# Patient Record
Sex: Female | Born: 1955
Health system: Southern US, Community
[De-identification: ages and names within clinical notes are randomized; demographics above are authoritative.]

## PROBLEM LIST (undated history)

## (undated) DIAGNOSIS — J189 Pneumonia, unspecified organism: Secondary | ICD-10-CM

## (undated) DIAGNOSIS — E7800 Pure hypercholesterolemia, unspecified: Secondary | ICD-10-CM

## (undated) DIAGNOSIS — I8390 Asymptomatic varicose veins of unspecified lower extremity: Secondary | ICD-10-CM

## (undated) DIAGNOSIS — J449 Chronic obstructive pulmonary disease, unspecified: Secondary | ICD-10-CM

## (undated) DIAGNOSIS — F419 Anxiety disorder, unspecified: Secondary | ICD-10-CM

## (undated) DIAGNOSIS — R112 Nausea with vomiting, unspecified: Secondary | ICD-10-CM

## (undated) DIAGNOSIS — J302 Other seasonal allergic rhinitis: Secondary | ICD-10-CM

## (undated) DIAGNOSIS — E119 Type 2 diabetes mellitus without complications: Secondary | ICD-10-CM

## (undated) DIAGNOSIS — B029 Zoster without complications: Secondary | ICD-10-CM

## (undated) DIAGNOSIS — R87629 Unspecified abnormal cytological findings in specimens from vagina: Secondary | ICD-10-CM

## (undated) DIAGNOSIS — Z9889 Other specified postprocedural states: Secondary | ICD-10-CM

## (undated) DIAGNOSIS — J45909 Unspecified asthma, uncomplicated: Secondary | ICD-10-CM

## (undated) DIAGNOSIS — K5790 Diverticulosis of intestine, part unspecified, without perforation or abscess without bleeding: Secondary | ICD-10-CM

## (undated) HISTORY — PX: WRIST SURGERY: SHX841

## (undated) HISTORY — DX: Anxiety disorder, unspecified: F41.9

## (undated) HISTORY — PX: ABDOMINAL HYSTERECTOMY: SHX81

## (undated) HISTORY — DX: Unspecified abnormal cytological findings in specimens from vagina: R87.629

## (undated) HISTORY — PX: TONSILLECTOMY: SUR1361

## (undated) HISTORY — DX: Asymptomatic varicose veins of unspecified lower extremity: I83.90

## (undated) HISTORY — PX: BACK SURGERY: SHX140

## (undated) HISTORY — DX: Type 2 diabetes mellitus without complications: E11.9

## (undated) HISTORY — DX: Unspecified asthma, uncomplicated: J45.909

---
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
## 1997-12-19 ENCOUNTER — Ambulatory Visit (HOSPITAL_COMMUNITY): Admission: RE | Admit: 1997-12-19 | Discharge: 1997-12-19 | Payer: Self-pay | Admitting: Urology

## 1998-01-17 ENCOUNTER — Ambulatory Visit (HOSPITAL_COMMUNITY): Admission: RE | Admit: 1998-01-17 | Discharge: 1998-01-17 | Payer: Self-pay | Admitting: Urology

## 2000-09-25 ENCOUNTER — Ambulatory Visit (HOSPITAL_BASED_OUTPATIENT_CLINIC_OR_DEPARTMENT_OTHER): Admission: RE | Admit: 2000-09-25 | Discharge: 2000-09-25 | Payer: Self-pay | Admitting: Otolaryngology

## 2003-11-28 ENCOUNTER — Ambulatory Visit (HOSPITAL_COMMUNITY): Admission: RE | Admit: 2003-11-28 | Discharge: 2003-11-28 | Payer: Self-pay | Admitting: Internal Medicine

## 2003-12-30 ENCOUNTER — Inpatient Hospital Stay (HOSPITAL_COMMUNITY): Admission: EM | Admit: 2003-12-30 | Discharge: 2004-01-02 | Payer: Self-pay | Admitting: Emergency Medicine

## 2004-01-25 ENCOUNTER — Ambulatory Visit (HOSPITAL_COMMUNITY): Admission: RE | Admit: 2004-01-25 | Discharge: 2004-01-25 | Payer: Self-pay | Admitting: Internal Medicine

## 2004-02-09 ENCOUNTER — Ambulatory Visit (HOSPITAL_COMMUNITY): Admission: RE | Admit: 2004-02-09 | Discharge: 2004-02-10 | Payer: Self-pay | Admitting: Neurosurgery

## 2004-11-15 ENCOUNTER — Ambulatory Visit: Payer: Self-pay | Admitting: Internal Medicine

## 2005-01-03 ENCOUNTER — Ambulatory Visit (HOSPITAL_COMMUNITY): Admission: RE | Admit: 2005-01-03 | Discharge: 2005-01-03 | Payer: Self-pay | Admitting: General Surgery

## 2005-09-09 ENCOUNTER — Emergency Department (HOSPITAL_COMMUNITY): Admission: EM | Admit: 2005-09-09 | Discharge: 2005-09-09 | Payer: Self-pay | Admitting: Emergency Medicine

## 2006-10-23 ENCOUNTER — Ambulatory Visit (HOSPITAL_COMMUNITY): Admission: RE | Admit: 2006-10-23 | Discharge: 2006-10-23 | Payer: Self-pay | Admitting: Family Medicine

## 2007-04-16 ENCOUNTER — Ambulatory Visit (HOSPITAL_COMMUNITY): Admission: RE | Admit: 2007-04-16 | Discharge: 2007-04-16 | Payer: Self-pay | Admitting: Internal Medicine

## 2007-05-03 ENCOUNTER — Encounter: Admission: RE | Admit: 2007-05-03 | Discharge: 2007-05-03 | Payer: Self-pay | Admitting: Internal Medicine

## 2009-05-17 ENCOUNTER — Ambulatory Visit (HOSPITAL_COMMUNITY): Admission: RE | Admit: 2009-05-17 | Discharge: 2009-05-17 | Payer: Self-pay | Admitting: Internal Medicine

## 2010-01-13 ENCOUNTER — Emergency Department (HOSPITAL_COMMUNITY): Admission: EM | Admit: 2010-01-13 | Discharge: 2010-01-13 | Payer: Self-pay | Admitting: Emergency Medicine

## 2010-08-11 ENCOUNTER — Encounter: Payer: Self-pay | Admitting: Internal Medicine

## 2010-09-06 ENCOUNTER — Ambulatory Visit (HOSPITAL_COMMUNITY)
Admission: RE | Admit: 2010-09-06 | Discharge: 2010-09-06 | Disposition: A | Discharge status: Home / Self Care | Payer: Self-pay | Source: Ambulatory Visit | Attending: Family Medicine | Admitting: Family Medicine

## 2010-09-06 ENCOUNTER — Other Ambulatory Visit (HOSPITAL_COMMUNITY): Payer: Self-pay | Admitting: Family Medicine

## 2010-09-06 DIAGNOSIS — R059 Cough, unspecified: Secondary | ICD-10-CM | POA: Insufficient documentation

## 2010-09-06 DIAGNOSIS — R0689 Other abnormalities of breathing: Secondary | ICD-10-CM

## 2010-09-06 DIAGNOSIS — F17200 Nicotine dependence, unspecified, uncomplicated: Secondary | ICD-10-CM

## 2010-09-06 DIAGNOSIS — R05 Cough: Secondary | ICD-10-CM | POA: Insufficient documentation

## 2010-09-06 DIAGNOSIS — Z87891 Personal history of nicotine dependence: Secondary | ICD-10-CM | POA: Insufficient documentation

## 2010-09-06 DIAGNOSIS — R0602 Shortness of breath: Secondary | ICD-10-CM | POA: Insufficient documentation

## 2010-12-06 NOTE — Cardiovascular Report (Signed)
[name], [name]                      ACCOUNT NO.:  [id number]   MEDICAL RECORD NO.:  [id number]                   PATIENT TYPE:  INP   LOCATION:  3738                                 FACILITY:  [facility]   PHYSICIAN:  Thereasa Solo. Little, M.D.              DATE OF BIRTH:  12-23-1955   DATE OF PROCEDURE:  01/01/2004  DATE OF DISCHARGE:  01/02/2004                              CARDIAC CATHETERIZATION   [name] is a 55 year old female who had had a normal stress test  approximately 2 years ago.  She presented to Dr. Sharyon Medicus office on Saturday  morning after having had two days of continual chest discomfort associated  with radiation into her neck and down her arm.  Her EKG is normal and her  cardiac enzymes were unremarkable.  Despite heparin and IV nitroglycerin,  she has continued to have mild episodes and is brought to the cath lab for  evaluation of potential underlying CAD.   After obtaining informed consent, the patient was prepped and draped in the  usual sterile fashion.  Following local anesthetic with 1% Xylocaine, the  Seldinger technique was employed and a 5 [place] introducer sheath was placed  into the right femoral artery.  Left and right coronary arteriography and  ventriculography in the RAO projection and distal aortogram was performed.   COMPLICATIONS:  None.   EQUIPMENT:  5 French Judkins configuration catheters.   RESULTS:  1. Hemodynamic monitoring.  Central aortic pressure 142/78, left ventricular     pressure 146/16.  There was no significant valve gradient noted at time     of pullback.  2. Ventriculography.  Ventriculography in the RAO projection using 25 mL of     contrast at 12 mL/sec revealed normal left ventricular systolic function.     Ventricular ectopy occurred during the ventriculogram and there was     mitral regurgitation noted, but I think this was secondary to ventricular     ectopy.  Ejection fraction was clearly greater than 60% and  the end-     diastolic pressure was 18.   DISTAL AORTOGRAM:  Distal aortogram done right above the level of the ramus  using 30 mL of contrast at 15 mL/sec showed no evidence of renal artery  stenosis and no abdominal aortic aneurysm.  The iliacs appear to be smooth  in contour.   CORONARY ARTERIOGRAPHY:  On fluoroscopy no calcification was seen.  1. Left main normal.  2. LAD.  The LAD went to the apex of the heart, gave rise to a large first     diagonal branch, this system was free of disease.  3. Circumflex.  The circumflex gave rise to two OM vessels.  The first one     was relatively small, the second was moderate size, __________ free of     disease.  4. Right coronary artery.  The right coronary artery was normal.  There was  a PDA and a small  posterolateral vessel that were also free of disease.     After three injections into the right coronary artery, there appeared to     be coronary artery spasm around the catheter.  Intracoronary nitro was     given, it completely resolved.   CONCLUSION:  1. Normal left ventricular systolic function.  2. No coronary artery disease.  3. No renal artery stenosis or abdominal aortic aneurysm.   At this point I cannot explain her symptoms based on her cardiac cath.  Will  keep her on PPI and give her antianxiety drugs.  Probably ready for  discharge in the a.m.                                               Thereasa Solo. Little, M.D.    ABL/MEDQ  D:  01/01/2004  T:  01/02/2004  Job:  [id number]   cc:   Madelin Rear. Sherwood Gambler, M.D.  [address]  [place]  [place] [address]  Fax: [phone]   Cath Lab

## 2010-12-06 NOTE — Discharge Summary (Signed)
NAMEANGLA, DELAHUNT                      ACCOUNT NO.:  [id number]   MEDICAL RECORD NO.:  [id number]                   PATIENT TYPE:  INP   LOCATION:  3738                                 FACILITY:  [facility]   PHYSICIAN:  Thereasa Solo. Little, M.D.              DATE OF BIRTH:  11/15/1955   DATE OF ADMISSION:  12/30/2003  DATE OF DISCHARGE:  01/02/2004                                 DISCHARGE SUMMARY   Ms. [name] is a 55 year old white female patient who was admitted  with chest pain.  She was seen by Dr. Julieanne Manson.  It was decided she  should undergo cardiac catheterization.  She was put on IV heparin, ruled  out for an MI, then on January 01, 2004 she underwent cardiac catheterization  by Dr. Julieanne Manson which showed normal coronary arteries.  She had normal  systolic function.  Dr. Clarene Duke saw her the following day, January 02, 2004.  Her blood pressure was 99/72, heart rate 77, temperature 98.5.  He felt that  she was stable for discharge to home.  She should be treated with Xanax and  a PPI.  Her chest pain was not related to any cardiac disease, possibly  related to increased stress or GI problems.  Thus, he felt that she should  follow up with Dr. Sherwood Gambler, her primary care physician.   LABORATORY DATA:  CK-MB's and troponin's were negative.  January 01, 2004,  sodium was 140, potassium 3.5, BUN 9, creatinine 0.8, glucose was 102.  Hemoglobin 11.9, hematocrit 34.4, platelets 226, WBC 9.7.  Total cholesterol  was 182, triglycerides 116, HDL was 46, and LDL was 113.  LFT's were within  normal limits.  The morning of her discharge, her potassium was 3.9, BUN 26,  creatinine 1.   She had a CT of her chest which showed no significant findings.  She had no  pulmonary embolus.  Upper abdomen also demonstrated no abnormalities.   DISCHARGE MEDICATIONS:  1. Xanax 0.25 mg.  May take 1 every eight hours as needed.  2. Nexium 40 mg once per day.  3. Xenical as taken previously.   She  should do no strenuous activity, lifting, pushing, pulling until Monday  and no driving x2 days.  If she has any problems with her groin, she will  please give our office a call.  She will follow up with Dr. Sherwood Gambler in one  week.   DISCHARGE DIAGNOSES:  1. Chest pain, not coronary ischemic related with negative CK-MB's and     negative coronary artery disease on     cardiac catheterization.  She also has a normal left ventricular     function.  2. Elevated stress.  3. Questionable gastroesophageal reflux disease.      [name], N.P.                        Thereasa Solo. Little,  M.D.    BB/MEDQ  D:  01/02/2004  T:  01/02/2004  Job:  [id number]   cc:   Madelin Rear. Sherwood Gambler, M.D.  [address]  [place]  [place] [address]  Fax: [phone]

## 2010-12-06 NOTE — Op Note (Signed)
NAMESABA, GOMM                      ACCOUNT NO.:  [id number]   MEDICAL RECORD NO.:  [id number]                   PATIENT TYPE:  OIB   LOCATION:  3013                                 FACILITY:  [facility]   PHYSICIAN:  Hilda Lias, M.D.                DATE OF BIRTH:  February 17, 1956   DATE OF PROCEDURE:  02/09/2004  DATE OF DISCHARGE:                                 OPERATIVE REPORT   PREOPERATIVE DIAGNOSIS:  C5-C6 herniated disk with a C6 radiculopathy.   POSTOPERATIVE DIAGNOSIS:  C5-C6 herniated disk with a C6 radiculopathy.   OPERATION PERFORMED:  Anterior 5-6 diskectomy, decompression of the spinal  cord, foraminotomy, interbody fusion.  Allograft, plate, microscope.   SURGEON:  Hilda Lias, M.D.   ASSISTANT:  Hewitt Shorts, M.D.   ANESTHESIA:  General.   INDICATIONS FOR PROCEDURE:  The patient was admitted because of neck and  left upper extremity pain.  The patient had work-up which was negative for  myocardial infarction.  MRI showed herniated disk at the level 5-6.  Surgery  was advised.  The risks were explained in the history and physical.   DESCRIPTION OF PROCEDURE:  The patient was taken to the operating room and  after intubation, the neck was prepped with Betadine.  A transverse incision  was made through the skin, platysma, subcutaneous tissue down to the  cervical spine.  X-ray showed that indeed we were at the level of 5-6.  We  brought the microscope into the area and the anterior ligament was opened.  Total diskectomy was accomplished.  We opened the posterior ligament and  decompression of the spinal cord followed by foraminotomy was done.  At the  level of 5-6 on the left, there was a herniated disk with a soft and a hard  component.  Decompression was done.  Having done this, the end plate was  drilled and allograft of 7 mm was inserted followed by a plate using four  screws.  Lateral C-spine showed good position of bone graft.  From then on,  the area was irrigated.  Hemostasis was done with bipolar.  The wound was  closed with Vicryl and Steri-Strips.                                               Hilda Lias, M.D.    EB/MEDQ  D:  02/09/2004  T:  02/10/2004  Job:  [id number]

## 2010-12-06 NOTE — Op Note (Signed)
[facility]  Patient:    [name], [name]                   MRN: [id number] Proc. Date: 09/25/00 Adm. Date:  [id number] Attending:  Serena Colonel H CC:         Artis Delay, M.D., [place]   Operative Report  PREOPERATIVE DIAGNOSIS:  Nasal septal deviation.  Inferior turbinate hypertrophy.  POSTOPERATIVE DIAGNOSIS:  Nasal septal deviation.  Inferior turbinate hypertrophy.  PROCEDURE:  Nasal septoplasty.  Submucous resection inferior turbinates bilaterally.  SURGEON:  Jefry H. Pollyann Kennedy, M.D.  ANESTHESIA:  General endotracheal anesthesia was used.  COMPLICATIONS:  None.  ESTIMATED BLOOD LOSS:  30 cc.  FINDINGS:  Severely deviated septum with a large spur extending almost down the entire length of the left nasal cavity causing significant obstruction on the left side.  The spur was composed of the junction of the vulmerian bone and the ethmoid plate posteriorly and the maxillary crest and the ethmoid plate anteriorly.  There was a high septal deviation cartilaginous septum toward the right side as well anteriorly.  The turbinates were enlarged bilaterally with severe bony thickening on the right side in particular.  The patient tolerated the procedure well, was awakened, extubated, and transferred to the recovery room in stable condition.  INDICATIONS:  This is a 55 year old lady with a history of chronic nasal obstruction and a history of recurring sinusitis.  Risks, benefits, alternatives, and complications of the procedure were explained to the patient who seemed to understand and agreed to surgery.  DESCRIPTION OF PROCEDURE:  The patient was taken to the operating room and placed on the operating table in the supine position.  Following induction of general endotracheal anesthesia, the table was positioned, the patient was prepped and draped in the usual sterile fashion.  Oxymetazoline spray was used in the nose preoperatively.  1%  Xylocaine with epinephrine was infiltrated into the septum, the columella, and the inferior turbinates bilaterally. Total of about 4 cc was used.  Afrin-soaked pledgets were placed bilaterally in the nasal cavities.  #1 - Nasal septoplasty.  A left hemitransfixion incision was used to approach the septal cartilage and a mucoperiochondrial flap was developed posteriorly down the left side back to the sphenoid rostrum.  The bony cartilaginous junction was divided.  A similar flap was developed posteriorly down the right side.  Mucosa was elevated off the maxillary crest bilaterally.  The maxillary crest was severely widened.  The superior attachment of the ethmoid plate was resected using Jansen-Middleton rongeurs and the posterior inferior attachments were taken down using Therapist, nutritional to outfracture.  Large fragments of ethmoid plate were resected.  The vulmerian and the maxillary crest deflection were taken down using combination of Therapist, nutritional and [employer]  Part of the quadrangular cartilage up high and posteriorly was resected to relieve the anterior deviation.  All of these maneuvers greatly enhanced the nasal airways on both sides, in particular on the left side.  The mucosa incision was reapproximated with 4-0 chromic suture interrupted and a quilting suture of 4-0 plain gut was used to reoppose the septal flaps.  #2 - Submucous resection of inferior turbinates.  The leading edge of the inferior turbinates was incised in a vertical fashion using a 15 scalpel on both sides.  Mucosa was elevated off the turbinate bones in all directions and large fragments of bone were resected on the right side.  The left side was outfractured using a Glorious Peach  elevator.  A turbinate remnant on the right was outfractured as well.  The nasal cavities were suctioned of blood and secretions and packed with rolled up Telfa gauze coated with bacitracin ointment. The pharynx was  suctioned of blood and secretions under direct visualization.  The patient was then awakened, extubated, and transferred to the recovery room. DD:  09/25/00 TD:  09/26/00 Job: [id number]

## 2010-12-06 NOTE — H&P (Signed)
[name], [name]            ACCOUNT NO.:  [id number]   MEDICAL RECORD NO.:  [id number]           PATIENT TYPE:  AMB   LOCATION:                                FACILITY:  [facility]   PHYSICIAN:  Dalia Heading, M.D.  DATE OF BIRTH:  28-Feb-1956   DATE OF ADMISSION:  DATE OF DISCHARGE:  LH                                HISTORY & PHYSICAL   DATE OF BIRTH:  1955/12/25.   CHIEF COMPLAINT:  Hematochezia.   HISTORY OF PRESENT ILLNESS:  The patient is a 55 year old white female who  was referred for endoscopic evaluation.  She needs a colonoscopy for  hematochezia.  She has been having some hemoccult-positive stools recently.  No abdominal pain, weight loss, nausea, vomiting, diarrhea, constipation or  melena had been noted.  She has never had a colonoscopy.  There is no family  history of colon carcinoma.   PAST MEDICAL HISTORY:  Includes depression.   PAST SURGICAL HISTORY:  Partial hysterectomy, right foot surgery, neck  surgery.   CURRENT MEDICATIONS:  Nexium, alprazolam, Lexapro.   ALLERGIES:  CODEINE.   REVIEW OF SYSTEMS:  The patient smokes cigarettes daily.  She denies any  significant alcohol use.  She denies any other cardiopulmonary difficulties  or bleeding disorders.   PHYSICAL EXAMINATION:  GENERAL APPEARANCE:  The patient is a well-developed,  well-nourished white female in no acute distress.  VITAL SIGNS:  She is afebrile, and the vital signs are stable.  LUNGS:  Clear to auscultation with equal breath sounds bilaterally.  HEART:  Reveals regular rate and rhythm without S3, S4 or murmurs.  ABDOMEN:  Soft, nontender, nondistended.  No hepatosplenomegaly or masses  are noted.  RECTAL:  Examination was deferred to the procedure.   IMPRESSION:  Hematochezia.   PLAN:  The patient is scheduled for a colonoscopy on January 03, 2005.  The  risks and benefits of the procedure including bleeding and perforation were  fully explained to the patient, who gave informed  consent.       MAJ/MEDQ  D:  01/02/2005  T:  01/02/2005  Job:  [id number]   cc:   Dalia Heading, M.D.  [address], [place]  [place] [address]  Fax: [phone]   Madelin Rear. Sherwood Gambler, MD  [address]  [place]  [place] [address]  Fax: [phone]

## 2010-12-06 NOTE — H&P (Signed)
[name], [name]                      ACCOUNT NO.:  [id number]   MEDICAL RECORD NO.:  [id number]                   PATIENT TYPE:  OIB   LOCATION:  3013                                 FACILITY:  [facility]   PHYSICIAN:  Hilda Lias, M.D.                DATE OF BIRTH:  Oct 19, 1955   DATE OF ADMISSION:  02/09/2004  DATE OF DISCHARGE:                                HISTORY & PHYSICAL   REASON FOR ADMISSION:  [name] is a lady who was seen by me in my  office several days ago because of neck pain radiation into her shoulder and  then to the left upper extremity associated with weakness, numbness.  The  patient is quite miserable.  The patient was admitted to [facility]  a few days ago because they thought she had an MI.  Cardiovascular workup  was negative.  MRI of the neck was obtained and she is sent to [place] for  evaluation.   PAST MEDICAL HISTORY:  1. Bladder surgery.  2. Hysterectomy.  3. Foot surgery.   She is ALLERGIC to CODEINE.   SOCIAL HISTORY:  Negative.   FAMILY HISTORY:  Mother is 10 and she had a bypass surgery.  Father is 68  with COPD.   REVIEW OF SYSTEMS:  Positive for arm weakness, chest pain.   PHYSICAL EXAMINATION:  GENERAL:  A patient who came to my office and she was  holding the left arm against the chest wall to prevent the pain.  HEAD, EARS, NOSE AND THROAT:  Normal.  NECK:  She is able to flex but extension causes pain that goes to both  shoulders.  LUNGS:  Clear.  CARDIAC:  Heart sounds normal.  ABDOMEN:  Normal.  EXTREMITIES:  Normal pulses.  NEUROLOGICAL:  Mental status normal.  Cranial nerves normal.  Strength:  She  has a weakness to the left bicep and less resistance.  Reflex is symmetrical  with absent on the left biceps.   Cervical spine cranial spine MRI shows she has a herniated disk with  spondylolisthesis at the level of C5-6, centered to the left.   CLINICAL IMPRESSION:  C5-6 herniated disk.   RECOMMENDATIONS:  The  patient is being admitted for anterior cervical  diskectomy and fusion.  She knows all the risks such as infection, CSF leak,  worsening pain, paralysis, damage to the vertebral artery, carotid, damage  to esophagus, failure of the graft, need for further surgery and no  improvement whatsoever.                                                Hilda Lias, M.D.    EB/MEDQ  D:  02/09/2004  T:  02/09/2004  Job:  [id number]

## 2013-04-20 ENCOUNTER — Emergency Department (HOSPITAL_COMMUNITY)
Admission: EM | Admit: 2013-04-20 | Discharge: 2013-04-20 | Discharge status: Against Medical Advice | Payer: Self-pay | Attending: Emergency Medicine | Admitting: Emergency Medicine

## 2013-04-20 ENCOUNTER — Encounter (HOSPITAL_COMMUNITY): Payer: Self-pay

## 2013-04-20 DIAGNOSIS — R21 Rash and other nonspecific skin eruption: Secondary | ICD-10-CM | POA: Insufficient documentation

## 2013-04-20 NOTE — ED Notes (Signed)
Pt reports rash to nose, face and back. Large pimple to tip of nose.  Pt stated area is very painful

## 2013-04-20 NOTE — ED Notes (Signed)
Pt stated to jason reg. [name] that she was leaving. Had to take mother home.

## 2013-04-21 ENCOUNTER — Emergency Department (HOSPITAL_COMMUNITY)
Admission: EM | Admit: 2013-04-21 | Discharge: 2013-04-21 | Disposition: A | Discharge status: Home / Self Care | Payer: Self-pay | Attending: Emergency Medicine | Admitting: Emergency Medicine

## 2013-04-21 ENCOUNTER — Encounter (HOSPITAL_COMMUNITY): Payer: Self-pay | Admitting: *Deleted

## 2013-04-21 DIAGNOSIS — Z880 Allergy status to penicillin: Secondary | ICD-10-CM | POA: Insufficient documentation

## 2013-04-21 DIAGNOSIS — X58XXXA Exposure to other specified factors, initial encounter: Secondary | ICD-10-CM | POA: Insufficient documentation

## 2013-04-21 DIAGNOSIS — Y939 Activity, unspecified: Secondary | ICD-10-CM | POA: Insufficient documentation

## 2013-04-21 DIAGNOSIS — S0501XA Injury of conjunctiva and corneal abrasion without foreign body, right eye, initial encounter: Secondary | ICD-10-CM

## 2013-04-21 DIAGNOSIS — S058X9A Other injuries of unspecified eye and orbit, initial encounter: Secondary | ICD-10-CM | POA: Insufficient documentation

## 2013-04-21 DIAGNOSIS — B029 Zoster without complications: Secondary | ICD-10-CM | POA: Insufficient documentation

## 2013-04-21 DIAGNOSIS — F17200 Nicotine dependence, unspecified, uncomplicated: Secondary | ICD-10-CM | POA: Insufficient documentation

## 2013-04-21 DIAGNOSIS — Y929 Unspecified place or not applicable: Secondary | ICD-10-CM | POA: Insufficient documentation

## 2013-04-21 HISTORY — DX: Zoster without complications: B02.9

## 2013-04-21 MED ORDER — ACYCLOVIR 800 MG PO TABS
800.0000 mg | ORAL_TABLET | Freq: Once | ORAL | Status: AC
  Administered 2013-04-21: 800 mg via ORAL
  Filled 2013-04-21: qty 1

## 2013-04-21 MED ORDER — OXYCODONE-ACETAMINOPHEN 5-325 MG PO TABS: 1.0000 | ORAL_TABLET | ORAL | Status: DC | PRN

## 2013-04-21 MED ORDER — FLUORESCEIN SODIUM 1 MG OP STRP
ORAL_STRIP | OPHTHALMIC | Status: AC
  Administered 2013-04-21: 1 via OPHTHALMIC
  Filled 2013-04-21: qty 2

## 2013-04-21 MED ORDER — OXYCODONE-ACETAMINOPHEN 5-325 MG PO TABS
2.0000 | ORAL_TABLET | Freq: Once | ORAL | Status: AC
  Administered 2013-04-21: 2 via ORAL
  Filled 2013-04-21: qty 2

## 2013-04-21 MED ORDER — NAPROXEN 500 MG PO TABS: 500.0000 mg | ORAL_TABLET | Freq: Two times a day (BID) | ORAL | Status: DC

## 2013-04-21 MED ORDER — ACYCLOVIR 400 MG PO TABS: 800.0000 mg | ORAL_TABLET | Freq: Every day | ORAL | Status: DC

## 2013-04-21 MED ORDER — FLUORESCEIN SODIUM 1 MG OP STRP
1.0000 | ORAL_STRIP | Freq: Once | OPHTHALMIC | Status: AC
  Administered 2013-04-21: 1 via OPHTHALMIC

## 2013-04-21 MED ORDER — TETRACAINE HCL 0.5 % OP SOLN
OPHTHALMIC | Status: AC
  Administered 2013-04-21: 2 [drp] via OPHTHALMIC
  Filled 2013-04-21: qty 2

## 2013-04-21 MED ORDER — TETRAHYDROZOLINE HCL 0.05 % OP SOLN: 1.0000 [drp] | Freq: Four times a day (QID) | OPHTHALMIC | Status: DC

## 2013-04-21 MED ORDER — TETRACAINE HCL 0.5 % OP SOLN
2.0000 [drp] | Freq: Once | OPHTHALMIC | Status: AC
  Administered 2013-04-21: 2 [drp] via OPHTHALMIC

## 2013-04-21 NOTE — Progress Notes (Signed)
ED/CM noted patient did not have health insurance and/or PCP listed in the computer.  Patient was given the [place] resource handout with information on the clinics, food pantries, and the handout for new health insurance sign-up.  Patient expressed appreciation for this. 

## 2013-04-21 NOTE — ED Notes (Signed)
Pt states this is her 9th episode of shingles. Noticed rash and pain to upper back/shoulder, hairline and sores beginning in mouth and nose.

## 2013-04-21 NOTE — ED Notes (Signed)
Pt alert & oriented x4, stable gait. Patient given discharge instructions, paperwork & prescription(s). Patient  instructed to stop at the registration desk to finish any additional paperwork. Patient verbalized understanding. Pt left department w/ no further questions. 

## 2013-04-21 NOTE — ED Provider Notes (Signed)
CSN: [id number]     Arrival date & time 04/21/13  1007 History  This chart was scribed for Vida Roller, MD, by Yevette Edwards, ED Scribe. This patient was seen in room APA07/APA07 and the patient's care was started at 10:58 AM.  First MD Initiated Contact with Patient 04/21/13 1037     Chief Complaint  Patient presents with  . Herpes Zoster    The history is provided by the patient.   HPI Comments: [name] is a 57 y.o. female, with a h/o shingles, who presents to the Emergency Department complaining of gradual-onset, persistent shingles which began three days ago and is not associated with a fever. She states she has affected areas to her mouth, nose, brow, hairline, upper back, and shoulder. She is experiencing pain associated with the areas. The pt denies any pain to her eyes, but she reports she experiences pressure. She reports that she has been under increased stress recently due to family responsibilities. She denies HIV or splenectomy. She has not had any vaccination since 2008.   Past Medical History  Diagnosis Date  . Shingles    Past Surgical History  Procedure Laterality Date  . Back surgery    . Tonsillectomy     No family history on file. History  Substance Use Topics  . Smoking status: Current Every Day Smoker  . Smokeless tobacco: Not on file  . Alcohol Use: No   No OB history provided.  Review of Systems  Constitutional: Negative for fever and chills.  Eyes: Negative for pain.  Skin: Positive for rash.    Allergies  Penicillins  Home Medications  No current outpatient prescriptions on file.  Triage Vitals: BP 136/87  Pulse 70  Temp(Src) 97.8 F (36.6 C) (Oral)  Resp 16  SpO2 98%  Physical Exam  Nursing note and vitals reviewed. Constitutional: She is oriented to person, place, and time. She appears well-developed and well-nourished. No distress.  HENT:  Head: Normocephalic and atraumatic.  Eyes: EOM are normal.  Bilateral corneal  abrasion in a inferior corneal location - not linear or dendritic.  Neck: Neck supple. No tracheal deviation present.  Cardiovascular: Normal rate, regular rhythm and normal heart sounds.   No murmur heard. Pulmonary/Chest: Effort normal and breath sounds normal. No respiratory distress. She has no wheezes.  Musculoskeletal: Normal range of motion.  Neurological: She is alert and oriented to person, place, and time.  Skin: Skin is warm and dry.  Erythematous scabbed lesions located on the nose, several on the chin, cheeks, and on upper back at midline.  There were no intra-oral lesions.  Tender to surface of nose.   Psychiatric: She has a normal mood and affect. Her behavior is normal.    ED Course  Procedures (including critical care time)  DIAGNOSTIC STUDIES: Oxygen Saturation is 98% on room air, normal by my interpretation.    COORDINATION OF CARE:  11:07 AM -Discussed treatment plan with patient, and the patient agreed to the plan.   11:11 AM- Fluorescein performed. Informed pt of results.   11:28 PM - 11:32 PM- Consulted with Dr. Charise Killian, an optometrist. His recommendation is oral acyclovir, and he will see her tomorrow - he believes that due to her history of dry eyes and using benadryl at night with bilateral corneal findings it is unlikely to be zoster.  The lesions are not the classic dendritic appearance.  Pt is otherwise stable for d/c.  Labs Review Labs Reviewed - No data to  display Imaging Review No results found.  MDM  No diagnosis found. Well appearing, medicine started in the emergency department, followup arranged with optometrist for tomorrow. This is the patient's optometrist and he feels comfortable handling this complaint in the office.  Meds given in ED:  Medications  tetracaine (PONTOCAINE) 0.5 % ophthalmic solution 2 drop (2 drops Both Eyes Given 04/21/13 1159)  fluorescein ophthalmic strip 1 strip (1 strip Both Eyes Given 04/21/13 1159)  acyclovir  (ZOVIRAX) tablet 800 mg (800 mg Oral Given 04/21/13 1158)  oxyCODONE-acetaminophen (PERCOCET/ROXICET) 5-325 MG per tablet 2 tablet (2 tablets Oral Given 04/21/13 1158)    Discharge Medication List as of 04/21/2013 11:46 AM    START taking these medications   Details  acyclovir (ZOVIRAX) 400 MG tablet Take 2 tablets (800 mg total) by mouth 5 (five) times daily., Starting 04/21/2013, Until Discontinued, Print    naproxen (NAPROSYN) 500 MG tablet Take 1 tablet (500 mg total) by mouth 2 (two) times daily with a meal., Starting 04/21/2013, Until Discontinued, Print    oxyCODONE-acetaminophen (PERCOCET) 5-325 MG per tablet Take 1 tablet by mouth every 4 (four) hours as needed for pain., Starting 04/21/2013, Until Discontinued, Print    tetrahydrozoline (VISINE) 0.05 % ophthalmic solution Place 1 drop into both eyes 4 (four) times daily., Starting 04/21/2013, Until Discontinued, Print          I personally performed the services described in this documentation, which was scribed in my presence. The recorded information has been reviewed and is accurate.      Vida Roller, MD 04/22/13 [phone]

## 2013-06-07 ENCOUNTER — Other Ambulatory Visit (HOSPITAL_COMMUNITY): Payer: Self-pay | Admitting: Physician Assistant

## 2013-06-07 DIAGNOSIS — Z139 Encounter for screening, unspecified: Secondary | ICD-10-CM

## 2013-06-23 ENCOUNTER — Ambulatory Visit (HOSPITAL_COMMUNITY)
Admission: RE | Admit: 2013-06-23 | Discharge: 2013-06-23 | Disposition: A | Discharge status: Home / Self Care | Payer: Self-pay | Source: Ambulatory Visit | Attending: Physician Assistant | Admitting: Physician Assistant

## 2013-06-23 DIAGNOSIS — Z139 Encounter for screening, unspecified: Secondary | ICD-10-CM

## 2013-10-12 ENCOUNTER — Other Ambulatory Visit (HOSPITAL_COMMUNITY): Payer: Self-pay | Admitting: Internal Medicine

## 2013-10-12 DIAGNOSIS — Z0000 Encounter for general adult medical examination without abnormal findings: Secondary | ICD-10-CM

## 2015-04-13 ENCOUNTER — Other Ambulatory Visit (HOSPITAL_COMMUNITY): Payer: Self-pay | Admitting: Physician Assistant

## 2015-04-13 DIAGNOSIS — Z1231 Encounter for screening mammogram for malignant neoplasm of breast: Secondary | ICD-10-CM

## 2015-04-18 ENCOUNTER — Ambulatory Visit (HOSPITAL_COMMUNITY)
Admission: RE | Admit: 2015-04-18 | Discharge: 2015-04-18 | Disposition: A | Discharge status: Home / Self Care | Payer: 59 | Source: Ambulatory Visit | Attending: Physician Assistant | Admitting: Physician Assistant

## 2015-04-18 DIAGNOSIS — Z1231 Encounter for screening mammogram for malignant neoplasm of breast: Secondary | ICD-10-CM | POA: Diagnosis not present

## 2015-04-20 ENCOUNTER — Other Ambulatory Visit: Payer: Self-pay | Admitting: Physician Assistant

## 2015-04-20 DIAGNOSIS — R928 Other abnormal and inconclusive findings on diagnostic imaging of breast: Secondary | ICD-10-CM

## 2015-05-08 ENCOUNTER — Ambulatory Visit (HOSPITAL_COMMUNITY)
Admission: RE | Admit: 2015-05-08 | Discharge: 2015-05-08 | Disposition: A | Discharge status: Home / Self Care | Payer: 59 | Source: Ambulatory Visit | Attending: Physician Assistant | Admitting: Physician Assistant

## 2015-05-08 DIAGNOSIS — R928 Other abnormal and inconclusive findings on diagnostic imaging of breast: Secondary | ICD-10-CM

## 2015-05-08 DIAGNOSIS — N6001 Solitary cyst of right breast: Secondary | ICD-10-CM | POA: Diagnosis present

## 2015-07-13 ENCOUNTER — Other Ambulatory Visit: Payer: Self-pay

## 2015-07-13 DIAGNOSIS — I8390 Asymptomatic varicose veins of unspecified lower extremity: Secondary | ICD-10-CM

## 2015-09-07 ENCOUNTER — Encounter: Payer: Self-pay | Admitting: Vascular Surgery

## 2015-09-18 ENCOUNTER — Ambulatory Visit (INDEPENDENT_AMBULATORY_CARE_PROVIDER_SITE_OTHER): Payer: BLUE CROSS/BLUE SHIELD | Admitting: Vascular Surgery

## 2015-09-18 ENCOUNTER — Ambulatory Visit (HOSPITAL_COMMUNITY)
Admission: RE | Admit: 2015-09-18 | Discharge: 2015-09-18 | Disposition: A | Discharge status: Home / Self Care | Payer: BLUE CROSS/BLUE SHIELD | Source: Ambulatory Visit | Attending: Vascular Surgery | Admitting: Vascular Surgery

## 2015-09-18 ENCOUNTER — Encounter: Payer: Self-pay | Admitting: Vascular Surgery

## 2015-09-18 VITALS — BP 107/74 | HR 62 | Temp 98.0°F | Resp 16 | Ht 67.0 in | Wt 175.0 lb

## 2015-09-18 DIAGNOSIS — I868 Varicose veins of other specified sites: Secondary | ICD-10-CM

## 2015-09-18 DIAGNOSIS — I8390 Asymptomatic varicose veins of unspecified lower extremity: Secondary | ICD-10-CM

## 2015-09-18 DIAGNOSIS — R609 Edema, unspecified: Secondary | ICD-10-CM | POA: Diagnosis present

## 2015-09-18 DIAGNOSIS — I8393 Asymptomatic varicose veins of bilateral lower extremities: Secondary | ICD-10-CM | POA: Diagnosis not present

## 2015-09-18 DIAGNOSIS — I872 Venous insufficiency (chronic) (peripheral): Secondary | ICD-10-CM

## 2015-09-18 NOTE — Progress Notes (Signed)
[facility]  Patient name: [name] MRN: [id number] DOB: 1955/11/26 Sex: female  REASON FOR CONSULT: Varicose veins  HPI: [name] is a 60 y.o. female, who presents for evaluation of varicose veins and leg pain,  Left greater than right. The patient reports onset of her symptoms about a year ago, with worsening over the past 6 months. The patient reports aching, heaviness and throbbing over the large varicosity of her left leg. She also reports increased swelling of her legs as the day goes on. The patient's symptoms are worse towards the end of the day. 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 She states that her leg pain is interfering with completing her daily work activities. The patient has seen her PCP who prescribed thigh-high compression stockings. She has been wearing these for the past 6 months without improvement. The patient also reports recently that her left leg varicosity behind her knee "popped" and she bled onto the couch. She reports this as very painful and caused her to jump up from the couch.   She has a family history of varicose veins. She has never had a DVT. The patient denies a history of claudication, rest pain and nonhealing wounds. He shouldn't is a half pack per day smoker for the past 40 years. She has no cardiac history.  Past Medical History  Diagnosis Date  . Shingles     History reviewed. No pertinent family history.  SOCIAL HISTORY: Social History   Social History  . Marital Status: Widowed    Spouse Name: N/A  . Number of Children: N/A  . Years of Education: N/A   Occupational History  . Not on file.   Social History Main Topics  . Smoking status: Current Every Day Smoker  . Smokeless tobacco: Not on file  . Alcohol Use: No  . Drug Use: No  . Sexual Activity: No   Other Topics Concern  . Not on file   Social History Narrative    Allergies  Allergen Reactions  . Penicillins      Current Outpatient Prescriptions  Medication Sig Dispense Refill  . aspirin 81 MG tablet Take 81 mg by mouth daily.    Marland Kitchen acyclovir (ZOVIRAX) 400 MG tablet Take 2 tablets (800 mg total) by mouth 5 (five) times daily. (Patient not taking: Reported on 09/18/2015) 50 tablet 0  . naproxen (NAPROSYN) 500 MG tablet Take 1 tablet (500 mg total) by mouth 2 (two) times daily with a meal. (Patient not taking: Reported on 09/18/2015) 30 tablet 0  . oxyCODONE-acetaminophen (PERCOCET) 5-325 MG per tablet Take 1 tablet by mouth every 4 (four) hours as needed for pain. (Patient not taking: Reported on 09/18/2015) 20 tablet 0  . tetrahydrozoline (VISINE) 0.05 % ophthalmic solution Place 1 drop into both eyes 4 (four) times daily. (Patient not taking: Reported on 09/18/2015) 15 mL 0   No current facility-administered medications for this visit.    REVIEW OF SYSTEMS:  [X]  denotes positive finding, [ ]  denotes negative finding Cardiac  Comments:  Chest pain or chest pressure:    Shortness of breath upon exertion:    Short of breath when lying flat:    Irregular heart rhythm:        Vascular    Pain in calf, thigh, or hip brought on by ambulation:    Pain in feet at night that wakes you up from your sleep:  x Pain in legs  Blood  clot in your veins:    Leg swelling:  x       Pulmonary    Oxygen at home:    Productive cough:     Wheezing:         Neurologic    Sudden weakness in arms or legs:     Sudden numbness in arms or legs:     Sudden onset of difficulty speaking or slurred speech:    Temporary loss of vision in one eye:     Problems with dizziness:         Gastrointestinal    Blood in stool:     Vomited blood:         Genitourinary    Burning when urinating:     Blood in urine:        Psychiatric    Major depression:         Hematologic    Bleeding problems:    Problems with blood clotting too easily:        Skin    Rashes or ulcers:        Constitutional    Fever or chills:       PHYSICAL EXAM: Filed Vitals:   09/18/15 1245  BP: 107/74  Pulse: 62  Temp: 98 F (36.7 C)  Resp: 16  Height: 5\' 7"  (1.702 m)  Weight: 175 lb (79.379 kg)  SpO2: 99%    GENERAL: The patient is a well-nourished female, in no acute distress. The vital signs are documented above. CARDIAC: There is a regular rate and rhythm. No carotid bruits.  VASCULAR: 2+ femoral pulses bilaterally, 2+ popliteal pulses bilaterally, 2+ posterior tibial pulses bilaterally, 2+ right dorsalis pedis, 1+ left dorsalis pedis. PULMONARY: There is good air exchange bilaterally without wheezing or rales. ABDOMEN: Soft and non-tender with normal pitched bowel sounds.  MUSCULOSKELETAL: There are no major deformities or cyanosis. Mild leg swelling bilaterally. NEUROLOGIC: No focal weakness or paresthesias are detected. SKIN: Large ropey varicosity from left mid thigh down to left calf. Multiple spider telangiectasias bilaterally. Bruising to left posterior knee from previous bleeding varicosity. 1+ edema distally. 3+ dorsalis pedis pulse palpable. PSYCHIATRIC: The patient has a normal affect.  DATA:  Venous reflux evaluation 09/18/2015 No evidence of DVT bilaterally. Common femoral vein reflux bilaterally. Left saphenofemoral reflux.    today Dr. Kellie Simmering performed a bedside sinus site ultrasound exam which confirmed gross reflux in the left great saphenous vein supplying this large ropey varicosity which extends from the proximal thigh down into the lateral calf  MEDICAL ISSUES: Chronic venous insufficiency   The patient has evidence of gross left great saphenous vein reflux. The patient has throbbing and aching of her left leg that significantly interferes with her daily work activities. She has worn above-knee compression stockings for the past 6 months without any relief. She has also had an episode of hemorrhage from her large left varicosity. Recommend laser ablation of the great saphenous vein with greater  than 20 stab phlebectomies and 1 course of sclerotherapy.   Virgina Jock, PA-C [facility] [phone]   patient has failed conservative treatment with long-leg compression stockings and also has had recent episode of spontaneous bleeding from large varix left distal thigh posteriorly   she has gross reflux in the left great saphenous vein which is supplying these painful varicosities. She has tried elastic compression stockings 20-30 millimeter gradient, elevation, and ibuprofen.   agree that she needs laser ablation left great saphenous  vein plus greater than 20 stab phlebectomy +1 course of sclerotherapy  We'll proceed with precertification to perform this in the near future and relieve her symptoms

## 2015-10-03 ENCOUNTER — Encounter: Payer: Self-pay | Admitting: Vascular Surgery

## 2015-10-08 ENCOUNTER — Other Ambulatory Visit: Payer: Self-pay | Admitting: *Deleted

## 2015-10-08 ENCOUNTER — Ambulatory Visit (INDEPENDENT_AMBULATORY_CARE_PROVIDER_SITE_OTHER): Payer: BLUE CROSS/BLUE SHIELD | Admitting: Vascular Surgery

## 2015-10-08 ENCOUNTER — Encounter: Payer: Self-pay | Admitting: Vascular Surgery

## 2015-10-08 VITALS — BP 127/80 | HR 61 | Temp 98.0°F | Resp 18 | Ht 66.0 in | Wt 180.0 lb

## 2015-10-08 DIAGNOSIS — I83892 Varicose veins of left lower extremities with other complications: Secondary | ICD-10-CM | POA: Diagnosis not present

## 2015-10-08 DIAGNOSIS — I83893 Varicose veins of bilateral lower extremities with other complications: Secondary | ICD-10-CM

## 2015-10-08 NOTE — Progress Notes (Signed)
Subjective:     Patient ID: [name], female   DOB: 1956-04-09, 60 y.o.   MRN: [id number]  HPI this 60 year old female had laser ablation of the left great saphenous vein the proximal third of the left thigh plus approximately 30 stab phlebectomy of painful varicosities performed under local tumescent anesthesia. She tolerated the procedures well. Review of Systems     Objective:   Physical Exam BP 127/80 mmHg  Pulse 61  Temp(Src) 98 F (36.7 C)  Resp 18  Ht 5\' 6"  (1.676 m)  Wt 180 lb (81.647 kg)  BMI 29.07 kg/m2  SpO2 99%       Assessment:     Well-tolerated laser ablation left great saphenous vein plus greater than 20 stab phlebectomy of painful varicosities performed under local tumescent anesthesia. A total of 703 J of energy was utilized.    Plan:     Patient return in 1 week for venous duplex exam to confirm closure left great saphenous vein She will then have one course of sclerotherapy in the near future

## 2015-10-08 NOTE — Progress Notes (Signed)
Laser Ablation Procedure    Date: 10/08/2015   [name] DOB:[birth year]  Consent signed: Yes    Surgeon:  Dr. Nelda Severe. Kellie Simmering  Procedure: Laser Ablation: left Greater Saphenous Vein  BP 127/80 mmHg  Pulse 61  Temp(Src) 98 F (36.7 C)  Resp 18  Ht 5\' 6"  (1.676 m)  Wt 180 lb (81.647 kg)  BMI 29.07 kg/m2  SpO2 99%  Tumescent Anesthesia: 400 cc 0.9% NaCl with 50 cc Lidocaine HCL with 1% Epi and 15 cc 8.4% NaHCO3  Local Anesthesia: 11 cc Lidocaine HCL and NaHCO3 (ratio 2:1)  Pulsed Mode: 15 watts, 570ms delay, 1.0 duration  Total Energy:  703            Total Pulses:   47             Total Time: :47    Stab Phlebectomy: >20 Sites: Thigh and Calf  Patient tolerated procedure well  Notes:   Description of Procedure:  After marking the course of the secondary varicosities, the patient was placed on the operating table in the supine position, and the left leg was prepped and draped in sterile fashion.   Local anesthetic was administered and under ultrasound guidance the saphenous vein was accessed with a micro needle and guide wire; then the mirco puncture sheath was placed.  A guide wire was inserted saphenofemoral junction , followed by a 5 french sheath.  The position of the sheath and then the laser fiber below the junction was confirmed using the ultrasound.  Tumescent anesthesia was administered along the course of the saphenous vein using ultrasound guidance. The patient was placed in Trendelenburg position and protective laser glasses were placed on patient and staff, and the laser was fired at 15 watts continuous mode advancing 1-80mm/second for a total of 703 joules.   For stab phlebectomies, local anesthetic was administered at the previously marked varicosities, and tumescent anesthesia was administered around the vessels.  Greater than 20 stab wounds were made using the tip of an 11 blade. And using the vein hook, the phlebectomies were performed using a hemostat to  avulse the varicosities.  Adequate hemostasis was achieved.     Steri strips were applied to the stab wounds and ABD pads and thigh high compression stockings were applied.  Ace wrap bandages were applied over the phlebectomy sites and at the top of the saphenofemoral junction. Blood loss was less than 15 cc.  The patient ambulated out of the operating room having tolerated the procedure well.

## 2015-10-09 ENCOUNTER — Telehealth: Payer: Self-pay | Admitting: *Deleted

## 2015-10-09 NOTE — Telephone Encounter (Signed)
Asked pt to call if she is having any problems or concerns.

## 2015-10-15 ENCOUNTER — Ambulatory Visit (INDEPENDENT_AMBULATORY_CARE_PROVIDER_SITE_OTHER): Payer: Self-pay | Admitting: Vascular Surgery

## 2015-10-15 ENCOUNTER — Ambulatory Visit (HOSPITAL_COMMUNITY)
Admission: RE | Admit: 2015-10-15 | Discharge: 2015-10-15 | Disposition: A | Discharge status: Home / Self Care | Payer: BLUE CROSS/BLUE SHIELD | Source: Ambulatory Visit | Attending: Vascular Surgery | Admitting: Vascular Surgery

## 2015-10-15 ENCOUNTER — Encounter: Payer: Self-pay | Admitting: Vascular Surgery

## 2015-10-15 VITALS — BP 119/81 | HR 76 | Temp 97.5°F | Resp 15 | Ht 66.0 in | Wt 174.0 lb

## 2015-10-15 DIAGNOSIS — I82492 Acute embolism and thrombosis of other specified deep vein of left lower extremity: Secondary | ICD-10-CM | POA: Insufficient documentation

## 2015-10-15 DIAGNOSIS — I83893 Varicose veins of bilateral lower extremities with other complications: Secondary | ICD-10-CM | POA: Diagnosis not present

## 2015-10-15 DIAGNOSIS — Z9889 Other specified postprocedural states: Secondary | ICD-10-CM | POA: Insufficient documentation

## 2015-10-15 DIAGNOSIS — I83892 Varicose veins of left lower extremities with other complications: Secondary | ICD-10-CM

## 2015-10-15 NOTE — Progress Notes (Signed)
Subjective:     Patient ID: [name], female   DOB: [birth year], 60 y.o.   MRN: [id number]  HPI this 60 year old female returns 1 week post-laser ablation left great saphenous vein (proximal third of leg) plus greater than 20 stab phlebectomy of painful varicosities. She had previously had a bleeding episode from the posterior thigh which was spontaneous. She has had no swelling in the leg or ankle since the procedure and has had minimal discomfort except the proximal third of the thigh overlying the great saphenous vein. She has had no chest pain, dyspnea on exertion, PND, hemoptysis, or claudication. She has worn her elastic compression stockings and take an ibuprofen as instructed.   Review of Systems     Objective:   Physical Exam BP 119/81 mmHg  Pulse 76  Temp(Src) 97.5 F (36.4 C)  Resp 15  Ht 5\' 6"  (1.676 m)  Wt 174 lb (78.926 kg)  BMI 28.10 kg/m2  SpO2 97%  Gen. well-developed well-nourished female no apparent distress alert and oriented 3 Lungs no rhonchi or wheezing Left leg with nicely healing stab phlebectomy sites. No distal edema noted. 3+ dorsalis pedis pulse palpable. Proximal great saphenous vein is mildly tender to deep palpation.  Today I ordered a venous duplex exam the left leg which I reviewed and interpreted. The proximal third of the anterior accessory branch which was feeding the painful varicosities is totally closed up to its junction with the great saphenous vein and there is no DVT in this area. There is a small amount of thrombus and a gastrocnemius vein in the proximal calf which could be chronic.     Assessment:     Successful laser ablation proximal left great saphenous vein-anterior chest root branch with proximally 30 stab phlebectomy of painful varicosities. Patient had history of bleeding.    Plan:     Patient will return for one session of sclerotherapy and this will complete her treatment regimen Continue daily aspirin

## 2015-10-29 ENCOUNTER — Encounter: Payer: Self-pay | Admitting: *Deleted

## 2015-10-31 ENCOUNTER — Ambulatory Visit (INDEPENDENT_AMBULATORY_CARE_PROVIDER_SITE_OTHER): Payer: BLUE CROSS/BLUE SHIELD | Admitting: *Deleted

## 2015-10-31 ENCOUNTER — Encounter: Payer: Self-pay | Admitting: Vascular Surgery

## 2015-10-31 DIAGNOSIS — I83892 Varicose veins of left lower extremities with other complications: Secondary | ICD-10-CM

## 2015-10-31 NOTE — Progress Notes (Signed)
X=.3% Sotradecol administered with a 27g butterfly.  Patient received a total of 6cc.  Treated all areas of concern especially the bleed site. Easy access. Tol well. Will follow prn.  Photos: No.  Compression stockings applied: Yes.

## 2015-12-31 DIAGNOSIS — Z1389 Encounter for screening for other disorder: Secondary | ICD-10-CM | POA: Diagnosis not present

## 2015-12-31 DIAGNOSIS — J329 Chronic sinusitis, unspecified: Secondary | ICD-10-CM | POA: Diagnosis not present

## 2015-12-31 DIAGNOSIS — N342 Other urethritis: Secondary | ICD-10-CM | POA: Diagnosis not present

## 2015-12-31 DIAGNOSIS — Z6829 Body mass index (BMI) 29.0-29.9, adult: Secondary | ICD-10-CM | POA: Diagnosis not present

## 2016-03-10 DIAGNOSIS — Z1389 Encounter for screening for other disorder: Secondary | ICD-10-CM | POA: Diagnosis not present

## 2016-03-10 DIAGNOSIS — N342 Other urethritis: Secondary | ICD-10-CM | POA: Diagnosis not present

## 2016-03-10 DIAGNOSIS — R350 Frequency of micturition: Secondary | ICD-10-CM | POA: Diagnosis not present

## 2016-03-10 DIAGNOSIS — Z6829 Body mass index (BMI) 29.0-29.9, adult: Secondary | ICD-10-CM | POA: Diagnosis not present

## 2016-03-14 DIAGNOSIS — Z6829 Body mass index (BMI) 29.0-29.9, adult: Secondary | ICD-10-CM | POA: Diagnosis not present

## 2016-03-14 DIAGNOSIS — R109 Unspecified abdominal pain: Secondary | ICD-10-CM | POA: Diagnosis not present

## 2016-03-14 DIAGNOSIS — R1031 Right lower quadrant pain: Secondary | ICD-10-CM | POA: Diagnosis not present

## 2016-03-14 DIAGNOSIS — E663 Overweight: Secondary | ICD-10-CM | POA: Diagnosis not present

## 2016-03-15 ENCOUNTER — Emergency Department (HOSPITAL_COMMUNITY)
Admission: EM | Admit: 2016-03-15 | Discharge: 2016-03-15 | Disposition: A | Discharge status: Home / Self Care | Payer: BLUE CROSS/BLUE SHIELD | Attending: Emergency Medicine | Admitting: Emergency Medicine

## 2016-03-15 ENCOUNTER — Emergency Department (HOSPITAL_COMMUNITY): Payer: BLUE CROSS/BLUE SHIELD

## 2016-03-15 ENCOUNTER — Encounter (HOSPITAL_COMMUNITY): Payer: Self-pay | Admitting: Emergency Medicine

## 2016-03-15 DIAGNOSIS — Z7982 Long term (current) use of aspirin: Secondary | ICD-10-CM | POA: Insufficient documentation

## 2016-03-15 DIAGNOSIS — F17210 Nicotine dependence, cigarettes, uncomplicated: Secondary | ICD-10-CM | POA: Diagnosis not present

## 2016-03-15 DIAGNOSIS — K5900 Constipation, unspecified: Secondary | ICD-10-CM | POA: Diagnosis not present

## 2016-03-15 DIAGNOSIS — R05 Cough: Secondary | ICD-10-CM | POA: Diagnosis not present

## 2016-03-15 DIAGNOSIS — Z79899 Other long term (current) drug therapy: Secondary | ICD-10-CM | POA: Insufficient documentation

## 2016-03-15 DIAGNOSIS — M545 Low back pain, unspecified: Secondary | ICD-10-CM

## 2016-03-15 DIAGNOSIS — Z5181 Encounter for therapeutic drug level monitoring: Secondary | ICD-10-CM | POA: Diagnosis not present

## 2016-03-15 DIAGNOSIS — K5730 Diverticulosis of large intestine without perforation or abscess without bleeding: Secondary | ICD-10-CM | POA: Diagnosis not present

## 2016-03-15 LAB — COMPREHENSIVE METABOLIC PANEL
ALT: 18 U/L (ref 14–54)
AST: 24 U/L (ref 15–41)
Albumin: 4.3 g/dL (ref 3.5–5.0)
Alkaline Phosphatase: 70 U/L (ref 38–126)
Anion gap: 10 (ref 5–15)
BILIRUBIN TOTAL: 0.6 mg/dL (ref 0.3–1.2)
BUN: 22 mg/dL — AB (ref 6–20)
CALCIUM: 9.7 mg/dL (ref 8.9–10.3)
CO2: 24 mmol/L (ref 22–32)
CREATININE: 1 mg/dL (ref 0.44–1.00)
Chloride: 103 mmol/L (ref 101–111)
GFR, EST NON AFRICAN AMERICAN: 59 mL/min — AB (ref >60)
Glucose, Bld: 97 mg/dL (ref 65–99)
Potassium: 4.2 mmol/L (ref 3.5–5.1)
Sodium: 137 mmol/L (ref 135–145)
TOTAL PROTEIN: 7.2 g/dL (ref 6.5–8.1)

## 2016-03-15 LAB — CBC WITH DIFFERENTIAL/PLATELET
BASOS PCT: 1 %
Basophils Absolute: 0.1 10*3/uL (ref 0.0–0.1)
EOS ABS: 0.5 10*3/uL (ref 0.0–0.7)
EOS PCT: 6 %
HEMATOCRIT: 39 % (ref 36.0–46.0)
HEMOGLOBIN: 13.3 g/dL (ref 12.0–15.0)
Lymphocytes Relative: 28 %
Lymphs Abs: 2.6 10*3/uL (ref 0.7–4.0)
MCH: 31.4 pg (ref 26.0–34.0)
MCHC: 34.1 g/dL (ref 30.0–36.0)
MCV: 92.2 fL (ref 78.0–100.0)
MONO ABS: 0.6 10*3/uL (ref 0.1–1.0)
Monocytes Relative: 6 %
Neutro Abs: 5.3 10*3/uL (ref 1.7–7.7)
Neutrophils Relative %: 59 %
Platelets: 270 10*3/uL (ref 150–400)
RBC: 4.23 MIL/uL (ref 3.87–5.11)
RDW: 12.7 % (ref 11.5–15.5)
WBC: 9.1 10*3/uL (ref 4.0–10.5)

## 2016-03-15 LAB — URINALYSIS, ROUTINE W REFLEX MICROSCOPIC
BILIRUBIN URINE: NEGATIVE
GLUCOSE, UA: 100 mg/dL — AB
HGB URINE DIPSTICK: NEGATIVE
Ketones, ur: NEGATIVE mg/dL
Nitrite: POSITIVE — AB
PH: 6.5 (ref 5.0–8.0)

## 2016-03-15 LAB — URINE MICROSCOPIC-ADD ON

## 2016-03-15 LAB — LIPASE, BLOOD: LIPASE: 34 U/L (ref 11–51)

## 2016-03-15 MED ORDER — DIATRIZOATE MEGLUMINE & SODIUM 66-10 % PO SOLN
ORAL | Status: AC
Start: 2016-03-15 — End: 2016-03-15
  Administered 2016-03-15: 15 mL via ORAL
  Filled 2016-03-15: qty 30

## 2016-03-15 MED ORDER — KETOROLAC TROMETHAMINE 30 MG/ML IJ SOLN
30.0000 mg | Freq: Once | INTRAMUSCULAR | Status: AC
  Administered 2016-03-15: 30 mg via INTRAVENOUS
  Filled 2016-03-15: qty 1

## 2016-03-15 MED ORDER — IOPAMIDOL (ISOVUE-300) INJECTION 61%
100.0000 mL | Freq: Once | INTRAVENOUS | Status: AC | PRN
  Administered 2016-03-15: 100 mL via INTRAVENOUS

## 2016-03-15 MED ORDER — ONDANSETRON HCL 4 MG/2ML IJ SOLN
4.0000 mg | Freq: Once | INTRAMUSCULAR | Status: AC
  Administered 2016-03-15: 4 mg via INTRAVENOUS
  Filled 2016-03-15: qty 2

## 2016-03-15 NOTE — ED Provider Notes (Signed)
[facility] DEPT Provider Note   CSN: [id number] Arrival date & time: 03/15/16  0910   By signing my name below, I, Royce Macadamia, attest that this documentation has been prepared under the direction and in the presence of  , PA-C. Electronically Signed: Royce Macadamia, ED Scribe. 03/15/16. 9:41 AM.   History   Chief Complaint Chief Complaint  Patient presents with  . Back Pain   The history is provided by the patient. No language interpreter was used.     HPI Comments:  [name] is a 60 y.o. female who presents to the Emergency Department complaining of gradually worsening pain in her lower back that radiates to her shoulders beginning 7 days ago.  She states she has an intermittent slight tingling, weakness in her legs when standing or bending.  She also notes diaphoresis at night, dry cough, chills, nausea and abdominal pain. She saw her PCP twice: five days ago and yesterday, and they ran urine test and culture which came back negative.  She was prescribed bactrim and pyridium which she has taken without relief. She states her PCP instructed her to go to the ED if her pain became worse.  She has taken nothing for her pain.  She denies constipation and states that her abdominal pain is not worse after eating.  She denies injury, urine or bowel changes, fever, and vomiting.      Past Medical History:  Diagnosis Date  . Shingles   . Varicose veins     Patient Active Problem List   Diagnosis Date Noted  . Varicose veins of left lower extremity with complications [id number]    Past Surgical History:  Procedure Laterality Date  . BACK SURGERY    . TONSILLECTOMY      OB History    No data available       Home Medications    Prior to Admission medications   Medication Sig Start Date End Date Taking? Authorizing Provider  acyclovir (ZOVIRAX) 400 MG tablet Take 2 tablets (800 mg total) by mouth 5 (five) times daily. 04/21/13   Noemi Chapel, MD  aspirin 81 MG tablet Take 81 mg by mouth daily.    Historical Provider, MD  naproxen (NAPROSYN) 500 MG tablet Take 1 tablet (500 mg total) by mouth 2 (two) times daily with a meal. 04/21/13   Noemi Chapel, MD  oxyCODONE-acetaminophen (PERCOCET) 5-325 MG per tablet Take 1 tablet by mouth every 4 (four) hours as needed for pain. Patient not taking: Reported on 10/15/2015 04/21/13   Noemi Chapel, MD  tetrahydrozoline (VISINE) 0.05 % ophthalmic solution Place 1 drop into both eyes 4 (four) times daily. 04/21/13   Noemi Chapel, MD    Family History Family History  Problem Relation Age of Onset  . Cancer Mother     breast  . Heart attack Mother     Social History Social History  Substance Use Topics  . Smoking status: Current Every Day Smoker    Packs/day: 0.50    Types: Cigarettes  . Smokeless tobacco: Never Used  . Alcohol use No     Allergies   Penicillins   Review of Systems Review of Systems  Constitutional: Positive for chills and diaphoresis. Negative for fever.  Respiratory: Positive for cough. Negative for chest tightness.   Cardiovascular: Negative for chest pain.  Gastrointestinal: Positive for abdominal pain and nausea. Negative for abdominal distention, constipation, diarrhea and vomiting.  Genitourinary: Negative for difficulty urinating, dysuria, flank pain, frequency, vaginal bleeding  and vaginal discharge.  Musculoskeletal: Positive for back pain.  Neurological: Positive for weakness (intermittent weakness of both LE's). Negative for dizziness and numbness.     Physical Exam Updated Vital Signs BP 129/94 (BP Location: Left Arm)   Pulse 77   Temp 98 F (36.7 C) (Oral)   Resp 18   Ht 5\' 6"  (1.676 m)   Wt 176 lb (79.8 kg)   SpO2 96%   BMI 28.41 kg/m   Physical Exam  Constitutional: She is oriented to person, place, and time. She appears well-developed and well-nourished. No distress.  HENT:  Head: Normocephalic and atraumatic.  Eyes:  Conjunctivae are normal.  Neck: Normal range of motion.  Cardiovascular: Normal rate, normal heart sounds and intact distal pulses.   Pulmonary/Chest: Effort normal. No respiratory distress.  Abdominal: Soft. She exhibits no distension and no mass. There is tenderness (right lower quadrant tenderness). There is no rebound and no guarding.  Musculoskeletal: Normal range of motion. She exhibits tenderness. She exhibits no edema.  ttp of the thoracic and lumbar spine and bilateral paraspinal muscles.  No bony step offs.  Pain with SLR on right at 30 degrees.    Neurological: She is alert and oriented to person, place, and time.  Skin: Skin is warm and dry.  Psychiatric: She has a normal mood and affect.  Nursing note and vitals reviewed.    ED Treatments / Results   DIAGNOSTIC STUDIES:  Oxygen Saturation is 96% on RA, nml by my interpretation.    COORDINATION OF CARE:  9:37 AM Discussed treatment plan with pt at bedside and pt agreed to plan.   Labs (all labs ordered are listed, but only abnormal results are displayed) Labs Reviewed  COMPREHENSIVE METABOLIC PANEL - Abnormal; Notable for the following:       Result Value   BUN 22 (*)    GFR calc non Af Amer 59 (*)    All other components within normal limits  URINALYSIS, ROUTINE W REFLEX MICROSCOPIC (NOT AT [facility]) - Abnormal; Notable for the following:    Color, Urine ORANGE (*)    Specific Gravity, Urine <1.005 (*)    Glucose, UA 100 (*)    Protein, ur TRACE (*)    Nitrite POSITIVE (*)    Leukocytes, UA TRACE (*)    All other components within normal limits  URINE MICROSCOPIC-ADD ON - Abnormal; Notable for the following:    Squamous Epithelial / LPF 0-5 (*)    Bacteria, UA RARE (*)    All other components within normal limits  URINE CULTURE  LIPASE, BLOOD  CBC WITH DIFFERENTIAL/PLATELET    EKG  EKG Interpretation None       Radiology Dg Lumbar Spine Complete  Result Date: 03/15/2016 CLINICAL DATA:  Low back  pain since last Saturday. EXAM: LUMBAR SPINE - COMPLETE 4+ VIEW COMPARISON:  None. FINDINGS: There is no evidence of lumbar spine fracture. There is mild scoliosis of spine. Degenerative joint changes with narrowed joint space and osteophyte formation identified throughout lumbar spine. Extensive bowel content is identified throughout colon. IMPRESSION: Mild-to-moderate degenerative joint changes of the spine. No acute abnormality identified. Extensive bowel content identified throughout colon consistent with constipation. Electronically Signed   By: Abelardo Diesel M.D.   On: 03/15/2016 10:15   Ct Abdomen Pelvis W Contrast  Result Date: 03/15/2016 CLINICAL DATA:  Lower back pain, right lower quadrant abdominal pain for 1 week. EXAM: CT ABDOMEN AND PELVIS WITH CONTRAST TECHNIQUE: Multidetector CT imaging of the  abdomen and pelvis was performed using the standard protocol following bolus administration of intravenous contrast. CONTRAST:  128mL ISOVUE-300 IOPAMIDOL (ISOVUE-300) INJECTION 61% COMPARISON:  09/09/2005 FINDINGS: Lower chest: Lung bases are clear. No effusions. Heart is normal size. Hepatobiliary: No focal hepatic abnormality. Gallbladder unremarkable. Pancreas: No focal abnormality or ductal dilatation. Spleen: No focal abnormality.  Normal size. Adrenals/Urinary Tract: No adrenal abnormality. No focal renal abnormality. No stones or hydronephrosis. Urinary bladder is unremarkable. Stomach/Bowel: Appendix is normal. Scattered sigmoid diverticula. No active diverticulitis. Stomach and small bowel unremarkable. Vascular/Lymphatic: No evidence of aneurysm or adenopathy. Reproductive: Prior hysterectomy.  No adnexal mass. Other: No free fluid or free air. Musculoskeletal: No acute bony abnormality or focal bone lesion. IMPRESSION: Normal appendix. Scattered sigmoid diverticulosis.  No active diverticulitis. No acute findings. Electronically Signed   By: Rolm Baptise M.D.   On: 03/15/2016 13:00     Procedures Procedures (including critical care time)  Medications Ordered in ED Medications  ketorolac (TORADOL) 30 MG/ML injection 30 mg (30 mg Intravenous Given 03/15/16 0953)  ondansetron (ZOFRAN) injection 4 mg (4 mg Intravenous Given 03/15/16 0953)  iopamidol (ISOVUE-300) 61 % injection 100 mL (100 mLs Intravenous Contrast Given 03/15/16 1233)  diatrizoate meglumine-sodium (GASTROGRAFIN) 66-10 % solution (15 mLs Oral Given 03/15/16 1232)     Initial Impression / Assessment and Plan / ED Course  I have reviewed the triage vital signs and the nursing notes.  Pertinent labs & imaging results that were available during my care of the patient were reviewed by me and considered in my medical decision making (see chart for details).  Clinical Course   Pt feeling better after medications.  Imaging and lab results discussed.     Patient with back pain.  No neurological deficits and normal neuro exam.  Patient is ambulatory with steady gait. No loss of bowel or bladder control.  No concern for cauda equina.      Supportive care, pt agrees to OTC Miralax or Doculax for constipation,  return precaution discussed. Appears safe for discharge at this time. Follow up as indicated in discharge paperwork.   Final Clinical Impressions(s) / ED Diagnoses   Final diagnoses:  Midline low back pain without sciatica  Constipation, unspecified constipation type    New Prescriptions New Prescriptions   No medications on file   I personally performed the services described in this documentation, which was scribed in my presence. The recorded information has been reviewed and is accurate.     Bufford Lope 03/15/16 2047    Merrily Pew, MD 03/16/16 1540

## 2016-03-15 NOTE — ED Triage Notes (Signed)
Pt admits to ED w/ c/o lower back pain that radiates towards shoulders. Per pt pain began appox x1 wk ago and has increased since then. Pt states she was seen by primary care doctor for symptoms however no relief or diagnosis was found-told to come to ED if pain increased. Pt AOx4.

## 2016-03-15 NOTE — Discharge Instructions (Signed)
Try Miralax one heaping cap full mixed in 8 oz of water, juice 1-2 times per day until relief or try Doculax as directed.  Follow-up with your doctor for recheck.  Tylenol or ibuprofen if needed for back pain

## 2016-03-17 LAB — URINE CULTURE: Culture: 20000 — AB

## 2016-03-18 ENCOUNTER — Telehealth (HOSPITAL_BASED_OUTPATIENT_CLINIC_OR_DEPARTMENT_OTHER): Payer: Self-pay | Admitting: Emergency Medicine

## 2016-03-18 NOTE — Telephone Encounter (Signed)
Post ED Visit - Positive Culture Follow-up  Culture report reviewed by antimicrobial stewardship pharmacist:  []  Elenor Quinones, Pharm.D. []  Heide Guile, Pharm.D., BCPS []  Parks Neptune, Pharm.D. []  Alycia Rossetti, Pharm.D., BCPS []  Magna, Pharm.D., BCPS, AAHIVP []  Legrand Como, Pharm.D., BCPS, AAHIVP []  Cassie Stewart, Pharm.D. []  Stephens November, Pharm.D. Arrie Senate Pharm D  Positive urine culture Treated with bactrim DS,asymptomatic, no further patient follow-up is required at this time.  Hazle Nordmann 03/18/2016, 9:12 AM

## 2016-03-20 DIAGNOSIS — K5730 Diverticulosis of large intestine without perforation or abscess without bleeding: Secondary | ICD-10-CM | POA: Diagnosis not present

## 2016-03-20 DIAGNOSIS — Z6828 Body mass index (BMI) 28.0-28.9, adult: Secondary | ICD-10-CM | POA: Diagnosis not present

## 2016-03-20 DIAGNOSIS — K5900 Constipation, unspecified: Secondary | ICD-10-CM | POA: Diagnosis not present

## 2016-03-20 DIAGNOSIS — Z1389 Encounter for screening for other disorder: Secondary | ICD-10-CM | POA: Diagnosis not present

## 2016-05-05 DIAGNOSIS — Z23 Encounter for immunization: Secondary | ICD-10-CM | POA: Diagnosis not present

## 2016-06-11 DIAGNOSIS — Z0000 Encounter for general adult medical examination without abnormal findings: Secondary | ICD-10-CM | POA: Diagnosis not present

## 2016-06-11 DIAGNOSIS — N390 Urinary tract infection, site not specified: Secondary | ICD-10-CM | POA: Diagnosis not present

## 2016-06-11 DIAGNOSIS — K5730 Diverticulosis of large intestine without perforation or abscess without bleeding: Secondary | ICD-10-CM | POA: Diagnosis not present

## 2016-06-11 DIAGNOSIS — Z6829 Body mass index (BMI) 29.0-29.9, adult: Secondary | ICD-10-CM | POA: Diagnosis not present

## 2016-06-11 DIAGNOSIS — Z1389 Encounter for screening for other disorder: Secondary | ICD-10-CM | POA: Diagnosis not present

## 2016-06-11 DIAGNOSIS — L680 Hirsutism: Secondary | ICD-10-CM | POA: Diagnosis not present

## 2016-06-11 DIAGNOSIS — L689 Hypertrichosis, unspecified: Secondary | ICD-10-CM | POA: Diagnosis not present

## 2016-08-01 ENCOUNTER — Ambulatory Visit (INDEPENDENT_AMBULATORY_CARE_PROVIDER_SITE_OTHER): Payer: BLUE CROSS/BLUE SHIELD | Admitting: Urology

## 2016-08-01 DIAGNOSIS — N3020 Other chronic cystitis without hematuria: Secondary | ICD-10-CM

## 2016-08-01 DIAGNOSIS — R3912 Poor urinary stream: Secondary | ICD-10-CM

## 2016-08-01 DIAGNOSIS — N952 Postmenopausal atrophic vaginitis: Secondary | ICD-10-CM

## 2016-08-20 DIAGNOSIS — Z6827 Body mass index (BMI) 27.0-27.9, adult: Secondary | ICD-10-CM | POA: Diagnosis not present

## 2016-08-20 DIAGNOSIS — I10 Essential (primary) hypertension: Secondary | ICD-10-CM | POA: Diagnosis not present

## 2016-08-20 DIAGNOSIS — J449 Chronic obstructive pulmonary disease, unspecified: Secondary | ICD-10-CM | POA: Diagnosis not present

## 2016-08-20 DIAGNOSIS — J329 Chronic sinusitis, unspecified: Secondary | ICD-10-CM | POA: Diagnosis not present

## 2016-08-20 DIAGNOSIS — Z1389 Encounter for screening for other disorder: Secondary | ICD-10-CM | POA: Diagnosis not present

## 2016-09-05 ENCOUNTER — Ambulatory Visit (INDEPENDENT_AMBULATORY_CARE_PROVIDER_SITE_OTHER): Payer: BLUE CROSS/BLUE SHIELD | Admitting: Urology

## 2016-09-05 DIAGNOSIS — R3912 Poor urinary stream: Secondary | ICD-10-CM | POA: Diagnosis not present

## 2016-09-05 DIAGNOSIS — N3020 Other chronic cystitis without hematuria: Secondary | ICD-10-CM | POA: Diagnosis not present

## 2016-09-05 DIAGNOSIS — N952 Postmenopausal atrophic vaginitis: Secondary | ICD-10-CM

## 2016-09-05 DIAGNOSIS — N3946 Mixed incontinence: Secondary | ICD-10-CM | POA: Diagnosis not present

## 2016-09-25 ENCOUNTER — Encounter (INDEPENDENT_AMBULATORY_CARE_PROVIDER_SITE_OTHER): Payer: Self-pay | Admitting: *Deleted

## 2016-10-03 ENCOUNTER — Other Ambulatory Visit (INDEPENDENT_AMBULATORY_CARE_PROVIDER_SITE_OTHER): Payer: Self-pay | Admitting: *Deleted

## 2016-10-03 ENCOUNTER — Encounter (INDEPENDENT_AMBULATORY_CARE_PROVIDER_SITE_OTHER): Payer: Self-pay | Admitting: *Deleted

## 2016-10-03 DIAGNOSIS — Z1211 Encounter for screening for malignant neoplasm of colon: Secondary | ICD-10-CM

## 2016-11-01 DIAGNOSIS — W57XXXA Bitten or stung by nonvenomous insect and other nonvenomous arthropods, initial encounter: Secondary | ICD-10-CM | POA: Diagnosis not present

## 2016-11-01 DIAGNOSIS — S1096XA Insect bite of unspecified part of neck, initial encounter: Secondary | ICD-10-CM | POA: Diagnosis not present

## 2016-11-01 DIAGNOSIS — R21 Rash and other nonspecific skin eruption: Secondary | ICD-10-CM | POA: Diagnosis not present

## 2016-11-15 DIAGNOSIS — W57XXXD Bitten or stung by nonvenomous insect and other nonvenomous arthropods, subsequent encounter: Secondary | ICD-10-CM | POA: Diagnosis not present

## 2016-11-15 DIAGNOSIS — S1096XD Insect bite of unspecified part of neck, subsequent encounter: Secondary | ICD-10-CM | POA: Diagnosis not present

## 2016-11-15 DIAGNOSIS — R21 Rash and other nonspecific skin eruption: Secondary | ICD-10-CM | POA: Diagnosis not present

## 2016-12-23 ENCOUNTER — Telehealth (INDEPENDENT_AMBULATORY_CARE_PROVIDER_SITE_OTHER): Payer: Self-pay | Admitting: *Deleted

## 2016-12-23 ENCOUNTER — Encounter (INDEPENDENT_AMBULATORY_CARE_PROVIDER_SITE_OTHER): Payer: Self-pay | Admitting: *Deleted

## 2016-12-23 NOTE — Telephone Encounter (Signed)
Patient needs trilyte 

## 2016-12-24 MED ORDER — PEG 3350-KCL-NA BICARB-NACL 420 G PO SOLR: 4000.0000 mL | Freq: Once | ORAL | 0 refills | Status: AC

## 2017-01-07 DIAGNOSIS — Z1211 Encounter for screening for malignant neoplasm of colon: Secondary | ICD-10-CM | POA: Diagnosis not present

## 2017-01-16 ENCOUNTER — Telehealth (INDEPENDENT_AMBULATORY_CARE_PROVIDER_SITE_OTHER): Payer: Self-pay | Admitting: *Deleted

## 2017-01-16 NOTE — Telephone Encounter (Signed)
agree

## 2017-01-16 NOTE — Telephone Encounter (Signed)
Referring MD/PCP: fusco   Procedure: tcs  Reason/Indication:  screening  Has patient had this procedure before?  Yes, 2008  If so, when, by whom and where?    Is there a family history of colon cancer?  no  Who?  What age when diagnosed?    Is patient diabetic?   no      Does patient have prosthetic heart valve or mechanical valve?  no  Do you have a pacemaker?  no  Has patient ever had endocarditis? no  Has patient had joint replacement within last 12 months?  no  Does patient tend to be constipated or take laxatives? no  Does patient have a history of alcohol/drug use?  no  Is patient on Coumadin, Plavix and/or Aspirin? yes  Medications: asa 81 mg every other day, flonase 50 mcg daily, vit d3 daily, cholesterol rx support daily, hair/skin/nail bid, super b complex daily, omega 3 daily, vitafusion daily, garlique daily  Allergies: see epic  Medication Adjustment per Dr Laural Golden: ass 2 days  Procedure date & time: 02/04/17 at 930

## 2017-02-04 ENCOUNTER — Encounter (HOSPITAL_COMMUNITY): Payer: Self-pay | Admitting: *Deleted

## 2017-02-04 ENCOUNTER — Ambulatory Visit (HOSPITAL_COMMUNITY)
Admission: RE | Admit: 2017-02-04 | Discharge: 2017-02-04 | Disposition: A | Discharge status: Home / Self Care | Payer: BLUE CROSS/BLUE SHIELD | Source: Ambulatory Visit | Attending: Internal Medicine | Admitting: Internal Medicine

## 2017-02-04 ENCOUNTER — Encounter (HOSPITAL_COMMUNITY)
Admission: RE | Disposition: A | Discharge status: Home / Self Care | Payer: Self-pay | Source: Ambulatory Visit | Attending: Internal Medicine

## 2017-02-04 DIAGNOSIS — Z91030 Bee allergy status: Secondary | ICD-10-CM | POA: Insufficient documentation

## 2017-02-04 DIAGNOSIS — K644 Residual hemorrhoidal skin tags: Secondary | ICD-10-CM | POA: Diagnosis not present

## 2017-02-04 DIAGNOSIS — F17210 Nicotine dependence, cigarettes, uncomplicated: Secondary | ICD-10-CM | POA: Insufficient documentation

## 2017-02-04 DIAGNOSIS — Z806 Family history of leukemia: Secondary | ICD-10-CM | POA: Insufficient documentation

## 2017-02-04 DIAGNOSIS — Z7982 Long term (current) use of aspirin: Secondary | ICD-10-CM | POA: Insufficient documentation

## 2017-02-04 DIAGNOSIS — Z8719 Personal history of other diseases of the digestive system: Secondary | ICD-10-CM | POA: Diagnosis not present

## 2017-02-04 DIAGNOSIS — Z1211 Encounter for screening for malignant neoplasm of colon: Secondary | ICD-10-CM

## 2017-02-04 DIAGNOSIS — K5730 Diverticulosis of large intestine without perforation or abscess without bleeding: Secondary | ICD-10-CM | POA: Diagnosis not present

## 2017-02-04 DIAGNOSIS — Z888 Allergy status to other drugs, medicaments and biological substances status: Secondary | ICD-10-CM | POA: Insufficient documentation

## 2017-02-04 DIAGNOSIS — Z885 Allergy status to narcotic agent status: Secondary | ICD-10-CM | POA: Insufficient documentation

## 2017-02-04 DIAGNOSIS — E7800 Pure hypercholesterolemia, unspecified: Secondary | ICD-10-CM | POA: Diagnosis not present

## 2017-02-04 DIAGNOSIS — Z803 Family history of malignant neoplasm of breast: Secondary | ICD-10-CM | POA: Diagnosis not present

## 2017-02-04 DIAGNOSIS — Z79899 Other long term (current) drug therapy: Secondary | ICD-10-CM | POA: Diagnosis not present

## 2017-02-04 HISTORY — DX: Nausea with vomiting, unspecified: R11.2

## 2017-02-04 HISTORY — DX: Pure hypercholesterolemia, unspecified: E78.00

## 2017-02-04 HISTORY — DX: Other seasonal allergic rhinitis: J30.2

## 2017-02-04 HISTORY — DX: Other specified postprocedural states: Z98.890

## 2017-02-04 HISTORY — DX: Diverticulosis of intestine, part unspecified, without perforation or abscess without bleeding: K57.90

## 2017-02-04 HISTORY — PX: COLONOSCOPY: SHX5424

## 2017-02-04 SURGERY — COLONOSCOPY
Anesthesia: Moderate Sedation

## 2017-02-04 MED ORDER — SODIUM CHLORIDE 0.9 % IV SOLN
INTRAVENOUS | Status: DC
  Administered 2017-02-04: 09:00:00 via INTRAVENOUS

## 2017-02-04 MED ORDER — MIDAZOLAM HCL 5 MG/5ML IJ SOLN
INTRAMUSCULAR | Status: DC | PRN
  Administered 2017-02-04 (×2): 2 mg via INTRAVENOUS
  Administered 2017-02-04 (×2): 1 mg via INTRAVENOUS

## 2017-02-04 MED ORDER — MEPERIDINE HCL 50 MG/ML IJ SOLN
INTRAMUSCULAR | Status: AC
  Filled 2017-02-04: qty 1

## 2017-02-04 MED ORDER — STERILE WATER FOR IRRIGATION IR SOLN
Status: DC | PRN
  Administered 2017-02-04: 09:00:00

## 2017-02-04 MED ORDER — MEPERIDINE HCL 50 MG/ML IJ SOLN
INTRAMUSCULAR | Status: DC | PRN
Start: 2017-02-04 — End: 2017-02-04
  Administered 2017-02-04 (×2): 25 mg via INTRAVENOUS

## 2017-02-04 MED ORDER — MIDAZOLAM HCL 5 MG/5ML IJ SOLN
INTRAMUSCULAR | Status: AC
  Filled 2017-02-04: qty 10

## 2017-02-04 NOTE — Discharge Instructions (Signed)
° °  Colonoscopy, Adult, Care After This sheet gives you information about how to care for yourself after your procedure. Your health care provider may also give you more specific instructions. If you have problems or questions, contact your health care provider. What can I expect after the procedure? After the procedure, it is common to have:  A small amount of blood in your stool for 24 hours after the procedure.  Some gas.  Mild abdominal cramping or bloating.  Follow these instructions at home: General instructions   For the first 24 hours after the procedure: ? Do not drive or use machinery. ? Do not sign important documents. ? Do not drink alcohol. ? Do your regular daily activities at a slower pace than normal. ? Eat soft, easy-to-digest foods. ? Rest often.  Take over-the-counter or prescription medicines only as told by your health care provider.  It is up to you to get the results of your procedure. Ask your health care provider, or the department performing the procedure, when your results will be ready. Relieving cramping and bloating  Try walking around when you have cramps or feel bloated.  Apply heat to your abdomen as told by your health care provider. Use a heat source that your health care provider recommends, such as a moist heat pack or a heating pad. ? Place a towel between your skin and the heat source. ? Leave the heat on for 20-30 minutes. ? Remove the heat if your skin turns bright red. This is especially important if you are unable to feel pain, heat, or cold. You may have a greater risk of getting burned. Eating and drinking  Drink enough fluid to keep your urine clear or pale yellow.  Resume your normal diet as instructed by your health care provider. Avoid heavy or fried foods that are hard to digest.  Avoid drinking alcohol for as long as instructed by your health care provider. Contact a health care provider if:  You have blood in your stool  2-3 days after the procedure. Get help right away if:  You have more than a small spotting of blood in your stool.  You pass large blood clots in your stool.  Your abdomen is swollen.  You have nausea or vomiting.  You have a fever.  You have increasing abdominal pain that is not relieved with medicine. This information is not intended to replace advice given to you by your health care provider. Make sure you discuss any questions you have with your health care provider. Document Released: 02/19/2004 Document Revised: 03/31/2016 Document Reviewed: 09/18/2015 Elsevier Interactive Patient Education  2018 [place] usual medications. High fiber diet. No driving for 24 hours. Next screening exam in 10 years.

## 2017-02-04 NOTE — H&P (Signed)
[name] is an 61 y.o. female.   Chief Complaint: Patient is here for colonoscopy. HPI: Patient is 61 year old Caucasian female who is here for screening colonoscopy. She denies abdominal pain change in bowel habits or rectal bleeding. Last colonoscopy was 10 years ago and was negative for polyps. Family history is positive for various known GI malignancies or on her mother site. Grandfather had leukemia. One aunt had thyroid cancer and another aunt had breast cancer and first cousin also had breast cancer.  Past Medical History:  Diagnosis Date  . Diverticulosis   . Hypercholesteremia   . PONV (postoperative nausea and vomiting)   . Seasonal allergies   . Shingles   . Varicose veins     Past Surgical History:  Procedure Laterality Date  . BACK SURGERY    . TONSILLECTOMY      Family History  Problem Relation Age of Onset  . Cancer Mother        breast  . Heart attack Mother   . Colon cancer Neg Hx    Social History:  reports that she has been smoking Cigarettes.  She has a 22.50 pack-year smoking history. She has never used smokeless tobacco. She reports that she does not drink alcohol or use drugs.  Allergies:  Allergies  Allergen Reactions  . Bee Venom Anaphylaxis  . Codeine Nausea And Vomiting  . Other Nausea And Vomiting    General anesthesia     Medications Prior to Admission  Medication Sig Dispense Refill  . aspirin 81 MG tablet Take 81 mg by mouth every other day.     . B Complex-C (SUPER B COMPLEX PO) Take 1 tablet by mouth daily.    . Cholecalciferol (VITAMIN D3) 2000 units capsule Take 4,000 Units by mouth daily.    Marland Kitchen conjugated estrogens (PREMARIN) vaginal cream Place 1 Applicatorful vaginally every other day.    . fluticasone (FLONASE) 50 MCG/ACT nasal spray Place 1 spray into both nostrils daily as needed for allergies or rhinitis.    . Garlic (GARLIQUE PO) Take 1 tablet by mouth daily.    Marland Kitchen ibuprofen (ADVIL,MOTRIN) 200 MG tablet Take 200 mg by  mouth daily as needed (sinus pressure).    . Ibuprofen-Diphenhydramine HCl (ADVIL PM) 200-25 MG CAPS Take 1 tablet by mouth at bedtime as needed (sleep).    . Misc Natural Products (CHOLESTEROL SUPPORT PO) Take 1 tablet by mouth daily.    . Multiple Vitamins-Minerals (HAIR SKIN AND NAILS FORMULA) TABS Take 2 tablets by mouth daily.    . Multiple Vitamins-Minerals (MULTIVITAMIN ADULTS PO) Take 1 tablet by mouth daily.    . Omega-3 Fatty Acids (OMEGA 3 PO) Take 2 tablets by mouth daily.     Marland Kitchen triamcinolone cream (KENALOG) 0.1 % Apply 1 application topically daily as needed (eczema).    . polyethylene glycol (MIRALAX / GLYCOLAX) packet Take 17 g by mouth daily as needed for mild constipation.      No results found for this or any previous visit (from the past 48 hour(s)). No results found.  ROS  Blood pressure 125/68, pulse 65, temperature 98.1 F (36.7 C), temperature source Oral, resp. rate 14, height 5\' 6"  (1.676 m), weight 166 lb (75.3 kg), SpO2 97 %. Physical Exam  Constitutional: She appears well-developed and well-nourished.  HENT:  Mouth/Throat: Oropharynx is clear and moist.  Eyes: Conjunctivae are normal. No scleral icterus.  Neck: No thyromegaly present.  Cardiovascular: Normal rate, regular rhythm and normal heart sounds.  No murmur heard. Respiratory: Effort normal and breath sounds normal.  GI: Soft. She exhibits no distension and no mass. There is no tenderness.  Musculoskeletal: She exhibits no edema.  Lymphadenopathy:    She has no cervical adenopathy.  Skin: Skin is warm and dry.     Assessment/Plan Average risk screening colonoscopy.  Hildred Laser, MD 02/04/2017, 9:07 AM

## 2017-02-04 NOTE — Op Note (Signed)
[facility] Patient Name: [name] Procedure Date: 02/04/2017 8:42 AM MRN: [id number] Date of Birth: [birth year] Attending MD: Hildred Laser , MD CSN: [id number] Age: 61 Admit Type: Outpatient Procedure:                Colonoscopy Indications:              Screening for colorectal malignant neoplasm Providers:                Hildred Laser, MD, Otis Peak B. Sharon Seller, RN, Aram Candela Referring MD:             Redmond School, MD Medicines:                Meperidine 50 mg IV, Midazolam 6 mg IV Complications:            No immediate complications. Estimated Blood Loss:     Estimated blood loss: none. Procedure:                Pre-Anesthesia Assessment:                           - Prior to the procedure, a History and Physical                            was performed, and patient medications and                            allergies were reviewed. The patient's tolerance of                            previous anesthesia was also reviewed. The risks                            and benefits of the procedure and the sedation                            options and risks were discussed with the patient.                            All questions were answered, and informed consent                            was obtained. Prior Anticoagulants: The patient                            last took ibuprofen 5 days prior to the procedure.                            ASA Grade Assessment: II - A patient with mild                            systemic disease. After reviewing the risks and  benefits, the patient was deemed in satisfactory                            condition to undergo the procedure.                           After obtaining informed consent, the colonoscope                            was passed under direct vision. Throughout the                            procedure, the patient's blood pressure, pulse, and     oxygen saturations were monitored continuously. The                            EC-3490TLi ([id number]) scope was introduced through                            the anus and advanced to the the cecum, identified                            by appendiceal orifice and ileocecal valve. The                            colonoscopy was performed without difficulty. The                            patient tolerated the procedure well. The quality                            of the bowel preparation was good. The ileocecal                            valve, appendiceal orifice, and rectum were                            photographed. Scope In: 9:19:24 AM Scope Out: 9:36:35 AM Scope Withdrawal Time: 0 hours 9 minutes 4 seconds  Total Procedure Duration: 0 hours 17 minutes 11 seconds  Findings:      The perianal and digital rectal examinations were normal.      A few small-mouthed diverticula were found in the sigmoid colon.      The exam was otherwise normal throughout the examined colon.      External hemorrhoids were found during retroflexion. The hemorrhoids       were small. Impression:               - Diverticulosis in the sigmoid colon.                           - External hemorrhoids.                           - No specimens collected. Moderate Sedation:      Moderate (conscious) sedation was administered by  the endoscopy nurse       and supervised by the endoscopist. The following parameters were       monitored: oxygen saturation, heart rate, blood pressure, CO2       capnography and response to care. Total physician intraservice time was       24 minutes. Recommendation:           - Patient has a contact number available for                            emergencies. The signs and symptoms of potential                            delayed complications were discussed with the                            patient. Return to normal activities tomorrow.                            Written discharge  instructions were provided to the                            patient.                           - High fiber diet today.                           - Continue present medications.                           - Repeat colonoscopy in 10 years for screening                            purposes. Procedure Code(s):        --- Professional ---                           [phone], Colonoscopy, flexible; diagnostic, including                            collection of specimen(s) by brushing or washing,                            when performed (separate procedure)                           99152, Moderate sedation services provided by the                            same physician or other qualified health care                            professional performing the diagnostic or                            therapeutic service that the sedation  supports,                            requiring the presence of an independent trained                            observer to assist in the monitoring of the                            patient's level of consciousness and physiological                            status; initial 15 minutes of intraservice time,                            patient age 50 years or older                           [phone], Moderate sedation services; each additional                            15 minutes intraservice time Diagnosis Code(s):        --- Professional ---                           Z12.11, Encounter for screening for malignant                            neoplasm of colon                           K64.4, Residual hemorrhoidal skin tags                           K57.30, Diverticulosis of large intestine without                            perforation or abscess without bleeding CPT copyright 2016 American Medical Association. All rights reserved. The codes documented in this report are preliminary and upon coder review may  be revised to meet current compliance requirements. Hildred Laser, MD Hildred Laser, MD 02/04/2017 9:45:24 AM This report has been signed electronically. Number of Addenda: 0

## 2017-02-09 ENCOUNTER — Encounter (HOSPITAL_COMMUNITY): Payer: Self-pay | Admitting: Internal Medicine

## 2017-03-06 ENCOUNTER — Ambulatory Visit (INDEPENDENT_AMBULATORY_CARE_PROVIDER_SITE_OTHER): Payer: BLUE CROSS/BLUE SHIELD | Admitting: Urology

## 2017-03-06 DIAGNOSIS — N3020 Other chronic cystitis without hematuria: Secondary | ICD-10-CM

## 2017-03-06 DIAGNOSIS — N3946 Mixed incontinence: Secondary | ICD-10-CM

## 2017-03-06 DIAGNOSIS — N952 Postmenopausal atrophic vaginitis: Secondary | ICD-10-CM

## 2017-05-04 DIAGNOSIS — Z23 Encounter for immunization: Secondary | ICD-10-CM | POA: Diagnosis not present

## 2017-06-18 DIAGNOSIS — J209 Acute bronchitis, unspecified: Secondary | ICD-10-CM | POA: Diagnosis not present

## 2017-06-18 DIAGNOSIS — L600 Ingrowing nail: Secondary | ICD-10-CM | POA: Diagnosis not present

## 2017-06-18 DIAGNOSIS — Z1389 Encounter for screening for other disorder: Secondary | ICD-10-CM | POA: Diagnosis not present

## 2017-06-18 DIAGNOSIS — Z0001 Encounter for general adult medical examination with abnormal findings: Secondary | ICD-10-CM | POA: Diagnosis not present

## 2017-06-18 DIAGNOSIS — J019 Acute sinusitis, unspecified: Secondary | ICD-10-CM | POA: Diagnosis not present

## 2017-06-18 DIAGNOSIS — Z6827 Body mass index (BMI) 27.0-27.9, adult: Secondary | ICD-10-CM | POA: Diagnosis not present

## 2017-06-18 DIAGNOSIS — Z0000 Encounter for general adult medical examination without abnormal findings: Secondary | ICD-10-CM | POA: Diagnosis not present

## 2017-07-09 DIAGNOSIS — L600 Ingrowing nail: Secondary | ICD-10-CM | POA: Diagnosis not present

## 2017-07-09 DIAGNOSIS — L03032 Cellulitis of left toe: Secondary | ICD-10-CM | POA: Diagnosis not present

## 2017-07-09 DIAGNOSIS — M79675 Pain in left toe(s): Secondary | ICD-10-CM | POA: Diagnosis not present

## 2017-07-20 DIAGNOSIS — R21 Rash and other nonspecific skin eruption: Secondary | ICD-10-CM | POA: Diagnosis not present

## 2017-07-20 DIAGNOSIS — T7840XA Allergy, unspecified, initial encounter: Secondary | ICD-10-CM | POA: Diagnosis not present

## 2017-07-20 DIAGNOSIS — Z6827 Body mass index (BMI) 27.0-27.9, adult: Secondary | ICD-10-CM | POA: Diagnosis not present

## 2017-07-20 DIAGNOSIS — E663 Overweight: Secondary | ICD-10-CM | POA: Diagnosis not present

## 2017-07-23 ENCOUNTER — Other Ambulatory Visit (HOSPITAL_COMMUNITY): Payer: Self-pay | Admitting: Family Medicine

## 2017-07-23 DIAGNOSIS — L600 Ingrowing nail: Secondary | ICD-10-CM | POA: Diagnosis not present

## 2017-07-23 DIAGNOSIS — L03032 Cellulitis of left toe: Secondary | ICD-10-CM | POA: Diagnosis not present

## 2017-07-23 DIAGNOSIS — M79672 Pain in left foot: Secondary | ICD-10-CM | POA: Diagnosis not present

## 2017-07-23 DIAGNOSIS — Z1231 Encounter for screening mammogram for malignant neoplasm of breast: Secondary | ICD-10-CM

## 2017-07-23 DIAGNOSIS — M79675 Pain in left toe(s): Secondary | ICD-10-CM | POA: Diagnosis not present

## 2017-09-23 ENCOUNTER — Ambulatory Visit (HOSPITAL_COMMUNITY)
Admission: RE | Admit: 2017-09-23 | Discharge: 2017-09-23 | Disposition: A | Discharge status: Home / Self Care | Payer: BLUE CROSS/BLUE SHIELD | Source: Ambulatory Visit | Attending: Family Medicine | Admitting: Family Medicine

## 2017-09-23 ENCOUNTER — Encounter (HOSPITAL_COMMUNITY): Payer: Self-pay

## 2017-09-23 DIAGNOSIS — Z1231 Encounter for screening mammogram for malignant neoplasm of breast: Secondary | ICD-10-CM

## 2017-12-04 ENCOUNTER — Ambulatory Visit: Payer: BLUE CROSS/BLUE SHIELD | Admitting: Urology

## 2017-12-04 DIAGNOSIS — N3020 Other chronic cystitis without hematuria: Secondary | ICD-10-CM

## 2017-12-23 DIAGNOSIS — Z6829 Body mass index (BMI) 29.0-29.9, adult: Secondary | ICD-10-CM | POA: Diagnosis not present

## 2017-12-23 DIAGNOSIS — W57XXXA Bitten or stung by nonvenomous insect and other nonvenomous arthropods, initial encounter: Secondary | ICD-10-CM | POA: Diagnosis not present

## 2017-12-23 DIAGNOSIS — R61 Generalized hyperhidrosis: Secondary | ICD-10-CM | POA: Diagnosis not present

## 2017-12-23 DIAGNOSIS — R591 Generalized enlarged lymph nodes: Secondary | ICD-10-CM | POA: Diagnosis not present

## 2017-12-23 DIAGNOSIS — T07XXXA Unspecified multiple injuries, initial encounter: Secondary | ICD-10-CM | POA: Diagnosis not present

## 2017-12-23 DIAGNOSIS — R5383 Other fatigue: Secondary | ICD-10-CM | POA: Diagnosis not present

## 2017-12-23 DIAGNOSIS — Z1389 Encounter for screening for other disorder: Secondary | ICD-10-CM | POA: Diagnosis not present

## 2018-05-18 DIAGNOSIS — Z23 Encounter for immunization: Secondary | ICD-10-CM | POA: Diagnosis not present

## 2018-06-09 ENCOUNTER — Ambulatory Visit: Payer: BLUE CROSS/BLUE SHIELD | Admitting: Urology

## 2018-06-09 DIAGNOSIS — N3941 Urge incontinence: Secondary | ICD-10-CM | POA: Diagnosis not present

## 2018-06-09 DIAGNOSIS — N3020 Other chronic cystitis without hematuria: Secondary | ICD-10-CM | POA: Diagnosis not present

## 2018-06-23 DIAGNOSIS — E663 Overweight: Secondary | ICD-10-CM | POA: Diagnosis not present

## 2018-06-23 DIAGNOSIS — I10 Essential (primary) hypertension: Secondary | ICD-10-CM | POA: Diagnosis not present

## 2018-06-23 DIAGNOSIS — Z6829 Body mass index (BMI) 29.0-29.9, adult: Secondary | ICD-10-CM | POA: Diagnosis not present

## 2018-06-23 DIAGNOSIS — Z1389 Encounter for screening for other disorder: Secondary | ICD-10-CM | POA: Diagnosis not present

## 2018-06-23 DIAGNOSIS — Z0000 Encounter for general adult medical examination without abnormal findings: Secondary | ICD-10-CM | POA: Diagnosis not present

## 2018-06-23 DIAGNOSIS — E782 Mixed hyperlipidemia: Secondary | ICD-10-CM | POA: Diagnosis not present

## 2018-06-23 DIAGNOSIS — J449 Chronic obstructive pulmonary disease, unspecified: Secondary | ICD-10-CM | POA: Diagnosis not present

## 2018-07-29 ENCOUNTER — Ambulatory Visit (INDEPENDENT_AMBULATORY_CARE_PROVIDER_SITE_OTHER): Payer: BLUE CROSS/BLUE SHIELD | Admitting: Otolaryngology

## 2018-07-29 DIAGNOSIS — J343 Hypertrophy of nasal turbinates: Secondary | ICD-10-CM | POA: Diagnosis not present

## 2018-07-29 DIAGNOSIS — J310 Chronic rhinitis: Secondary | ICD-10-CM

## 2018-07-29 DIAGNOSIS — J342 Deviated nasal septum: Secondary | ICD-10-CM | POA: Diagnosis not present

## 2018-10-05 DIAGNOSIS — J209 Acute bronchitis, unspecified: Secondary | ICD-10-CM | POA: Diagnosis not present

## 2018-10-05 DIAGNOSIS — Z681 Body mass index (BMI) 19 or less, adult: Secondary | ICD-10-CM | POA: Diagnosis not present

## 2018-10-05 DIAGNOSIS — B349 Viral infection, unspecified: Secondary | ICD-10-CM | POA: Diagnosis not present

## 2018-10-05 DIAGNOSIS — J22 Unspecified acute lower respiratory infection: Secondary | ICD-10-CM | POA: Diagnosis not present

## 2018-10-20 DIAGNOSIS — R7309 Other abnormal glucose: Secondary | ICD-10-CM | POA: Diagnosis not present

## 2018-10-20 DIAGNOSIS — C44629 Squamous cell carcinoma of skin of left upper limb, including shoulder: Secondary | ICD-10-CM | POA: Diagnosis not present

## 2018-10-20 DIAGNOSIS — K7290 Hepatic failure, unspecified without coma: Secondary | ICD-10-CM | POA: Diagnosis not present

## 2018-10-20 DIAGNOSIS — D649 Anemia, unspecified: Secondary | ICD-10-CM | POA: Diagnosis not present

## 2018-10-20 DIAGNOSIS — M5136 Other intervertebral disc degeneration, lumbar region: Secondary | ICD-10-CM | POA: Diagnosis not present

## 2018-10-20 DIAGNOSIS — Z6829 Body mass index (BMI) 29.0-29.9, adult: Secondary | ICD-10-CM | POA: Diagnosis not present

## 2018-10-20 DIAGNOSIS — E663 Overweight: Secondary | ICD-10-CM | POA: Diagnosis not present

## 2018-10-20 DIAGNOSIS — Z0001 Encounter for general adult medical examination with abnormal findings: Secondary | ICD-10-CM | POA: Diagnosis not present

## 2018-10-20 DIAGNOSIS — M11862 Other specified crystal arthropathies, left knee: Secondary | ICD-10-CM | POA: Diagnosis not present

## 2018-10-20 DIAGNOSIS — R8290 Unspecified abnormal findings in urine: Secondary | ICD-10-CM | POA: Diagnosis not present

## 2018-10-20 DIAGNOSIS — I10 Essential (primary) hypertension: Secondary | ICD-10-CM | POA: Diagnosis not present

## 2018-10-20 DIAGNOSIS — M9903 Segmental and somatic dysfunction of lumbar region: Secondary | ICD-10-CM | POA: Diagnosis not present

## 2018-10-20 DIAGNOSIS — K219 Gastro-esophageal reflux disease without esophagitis: Secondary | ICD-10-CM | POA: Diagnosis not present

## 2018-10-20 DIAGNOSIS — G4733 Obstructive sleep apnea (adult) (pediatric): Secondary | ICD-10-CM | POA: Diagnosis not present

## 2018-10-20 DIAGNOSIS — K7581 Nonalcoholic steatohepatitis (NASH): Secondary | ICD-10-CM | POA: Diagnosis not present

## 2018-10-20 DIAGNOSIS — J449 Chronic obstructive pulmonary disease, unspecified: Secondary | ICD-10-CM | POA: Diagnosis not present

## 2018-10-20 DIAGNOSIS — Z03818 Encounter for observation for suspected exposure to other biological agents ruled out: Secondary | ICD-10-CM | POA: Diagnosis not present

## 2018-11-11 ENCOUNTER — Encounter: Payer: Self-pay | Admitting: *Deleted

## 2018-11-29 ENCOUNTER — Other Ambulatory Visit: Payer: Self-pay

## 2018-11-29 ENCOUNTER — Ambulatory Visit (INDEPENDENT_AMBULATORY_CARE_PROVIDER_SITE_OTHER): Payer: PPO | Admitting: Otolaryngology

## 2018-11-29 DIAGNOSIS — J320 Chronic maxillary sinusitis: Secondary | ICD-10-CM | POA: Diagnosis not present

## 2018-11-29 DIAGNOSIS — J342 Deviated nasal septum: Secondary | ICD-10-CM

## 2018-11-29 DIAGNOSIS — J343 Hypertrophy of nasal turbinates: Secondary | ICD-10-CM

## 2018-12-02 ENCOUNTER — Other Ambulatory Visit: Payer: Self-pay | Admitting: *Deleted

## 2018-12-02 NOTE — Patient Outreach (Signed)
[facility]) Care Management  12/02/2018  [name] 1956-05-07 [id number]   HTA HRA Telephone Screen     Outreach Attempt:  Successful telephone outreach to patient to complete Health Risk Assessment Screening.  HIPAA verified with patient.  Patient reporting no significant medical history.  Reports she does smoke and is trying to decrease to quit soon.  Denies any needs, at this time.   Plan:  Patient will be placed in a [place] with 6 month follow up.  RN Health Coach will send [facility] Welcome Letter.  RN Health Coach sent EMMI Quitting Smoking.  RN Health Coach will make next telephone outreach to patient for follow up in the month of October.  [place] Coach [phone] [email or web address]

## 2018-12-07 ENCOUNTER — Other Ambulatory Visit (HOSPITAL_COMMUNITY): Payer: Self-pay | Admitting: Otolaryngology

## 2018-12-07 ENCOUNTER — Other Ambulatory Visit: Payer: Self-pay | Admitting: Otolaryngology

## 2018-12-07 DIAGNOSIS — J320 Chronic maxillary sinusitis: Secondary | ICD-10-CM

## 2018-12-09 DIAGNOSIS — D23112 Other benign neoplasm of skin of right lower eyelid, including canthus: Secondary | ICD-10-CM | POA: Diagnosis not present

## 2018-12-14 ENCOUNTER — Ambulatory Visit (HOSPITAL_COMMUNITY)
Admission: RE | Admit: 2018-12-14 | Discharge: 2018-12-14 | Disposition: A | Discharge status: Home / Self Care | Payer: PPO | Source: Ambulatory Visit | Attending: Otolaryngology | Admitting: Otolaryngology

## 2018-12-14 ENCOUNTER — Other Ambulatory Visit: Payer: Self-pay

## 2018-12-14 DIAGNOSIS — J320 Chronic maxillary sinusitis: Secondary | ICD-10-CM

## 2018-12-14 DIAGNOSIS — J3489 Other specified disorders of nose and nasal sinuses: Secondary | ICD-10-CM | POA: Diagnosis not present

## 2018-12-20 DIAGNOSIS — J189 Pneumonia, unspecified organism: Secondary | ICD-10-CM

## 2018-12-20 HISTORY — DX: Pneumonia, unspecified organism: J18.9

## 2018-12-23 ENCOUNTER — Ambulatory Visit (INDEPENDENT_AMBULATORY_CARE_PROVIDER_SITE_OTHER): Payer: PPO | Admitting: Otolaryngology

## 2018-12-23 ENCOUNTER — Other Ambulatory Visit: Payer: Self-pay

## 2018-12-23 DIAGNOSIS — J310 Chronic rhinitis: Secondary | ICD-10-CM | POA: Diagnosis not present

## 2018-12-23 DIAGNOSIS — J342 Deviated nasal septum: Secondary | ICD-10-CM | POA: Diagnosis not present

## 2018-12-23 DIAGNOSIS — J343 Hypertrophy of nasal turbinates: Secondary | ICD-10-CM

## 2018-12-23 DIAGNOSIS — R51 Headache: Secondary | ICD-10-CM | POA: Diagnosis not present

## 2019-01-07 ENCOUNTER — Other Ambulatory Visit: Payer: Self-pay | Admitting: *Deleted

## 2019-01-07 NOTE — Patient Outreach (Signed)
[facility]) Care Management  01/07/2019  [name] Jun 27, 1956 [id number]   Case Closure/Transition to [facility]   Outreach:  Patient case has been transitioned to [facility]I for further Care Management assistance.  Plan: RN Health Coach will close case at this time. RN Health Coach will send primary care provider Care Management Case Closure Letter. RN Health Coach will make patient inactive with [facility] Care Management at this time.  [place] [phone] [email or web address]

## 2019-01-14 DIAGNOSIS — E663 Overweight: Secondary | ICD-10-CM | POA: Diagnosis not present

## 2019-01-14 DIAGNOSIS — S70369A Insect bite (nonvenomous), unspecified thigh, initial encounter: Secondary | ICD-10-CM | POA: Diagnosis not present

## 2019-01-14 DIAGNOSIS — Z6829 Body mass index (BMI) 29.0-29.9, adult: Secondary | ICD-10-CM | POA: Diagnosis not present

## 2019-01-14 DIAGNOSIS — J441 Chronic obstructive pulmonary disease with (acute) exacerbation: Secondary | ICD-10-CM | POA: Diagnosis not present

## 2019-01-14 DIAGNOSIS — W57XXXD Bitten or stung by nonvenomous insect and other nonvenomous arthropods, subsequent encounter: Secondary | ICD-10-CM | POA: Diagnosis not present

## 2019-01-28 ENCOUNTER — Ambulatory Visit (HOSPITAL_COMMUNITY)
Admission: RE | Admit: 2019-01-28 | Discharge: 2019-01-28 | Disposition: A | Discharge status: Home / Self Care | Payer: PPO | Source: Ambulatory Visit | Attending: Family Medicine | Admitting: Family Medicine

## 2019-01-28 ENCOUNTER — Other Ambulatory Visit: Payer: Self-pay

## 2019-01-28 ENCOUNTER — Other Ambulatory Visit: Payer: PPO

## 2019-01-28 ENCOUNTER — Other Ambulatory Visit (HOSPITAL_COMMUNITY): Payer: Self-pay | Admitting: Family Medicine

## 2019-01-28 ENCOUNTER — Encounter (HOSPITAL_COMMUNITY): Payer: Self-pay

## 2019-01-28 DIAGNOSIS — J209 Acute bronchitis, unspecified: Secondary | ICD-10-CM | POA: Diagnosis not present

## 2019-01-28 DIAGNOSIS — J069 Acute upper respiratory infection, unspecified: Secondary | ICD-10-CM | POA: Diagnosis not present

## 2019-01-28 DIAGNOSIS — J449 Chronic obstructive pulmonary disease, unspecified: Secondary | ICD-10-CM | POA: Diagnosis not present

## 2019-01-28 DIAGNOSIS — Z20822 Contact with and (suspected) exposure to covid-19: Secondary | ICD-10-CM

## 2019-01-28 DIAGNOSIS — R6889 Other general symptoms and signs: Secondary | ICD-10-CM | POA: Diagnosis not present

## 2019-02-03 DIAGNOSIS — J181 Lobar pneumonia, unspecified organism: Secondary | ICD-10-CM | POA: Diagnosis not present

## 2019-02-04 LAB — NOVEL CORONAVIRUS, NAA: SARS-CoV-2, NAA: NOT DETECTED

## 2019-02-21 DIAGNOSIS — J329 Chronic sinusitis, unspecified: Secondary | ICD-10-CM | POA: Diagnosis not present

## 2019-02-21 DIAGNOSIS — I10 Essential (primary) hypertension: Secondary | ICD-10-CM | POA: Diagnosis not present

## 2019-02-21 DIAGNOSIS — E663 Overweight: Secondary | ICD-10-CM | POA: Diagnosis not present

## 2019-02-21 DIAGNOSIS — Z6829 Body mass index (BMI) 29.0-29.9, adult: Secondary | ICD-10-CM | POA: Diagnosis not present

## 2019-02-21 DIAGNOSIS — J449 Chronic obstructive pulmonary disease, unspecified: Secondary | ICD-10-CM | POA: Diagnosis not present

## 2019-02-22 ENCOUNTER — Other Ambulatory Visit: Payer: Self-pay | Admitting: Otolaryngology

## 2019-03-15 ENCOUNTER — Encounter (HOSPITAL_BASED_OUTPATIENT_CLINIC_OR_DEPARTMENT_OTHER): Payer: Self-pay | Admitting: *Deleted

## 2019-03-15 ENCOUNTER — Other Ambulatory Visit: Payer: Self-pay

## 2019-03-16 ENCOUNTER — Ambulatory Visit (INDEPENDENT_AMBULATORY_CARE_PROVIDER_SITE_OTHER): Payer: PPO | Admitting: Urology

## 2019-03-16 DIAGNOSIS — N3020 Other chronic cystitis without hematuria: Secondary | ICD-10-CM | POA: Diagnosis not present

## 2019-03-17 ENCOUNTER — Other Ambulatory Visit: Payer: Self-pay

## 2019-03-17 ENCOUNTER — Other Ambulatory Visit (HOSPITAL_COMMUNITY): Payer: PPO

## 2019-03-17 ENCOUNTER — Other Ambulatory Visit (HOSPITAL_COMMUNITY)
Admission: RE | Admit: 2019-03-17 | Discharge: 2019-03-17 | Disposition: A | Discharge status: Home / Self Care | Payer: PPO | Source: Ambulatory Visit | Attending: Otolaryngology | Admitting: Otolaryngology

## 2019-03-17 DIAGNOSIS — Z20828 Contact with and (suspected) exposure to other viral communicable diseases: Secondary | ICD-10-CM | POA: Insufficient documentation

## 2019-03-17 DIAGNOSIS — Z01812 Encounter for preprocedural laboratory examination: Secondary | ICD-10-CM | POA: Insufficient documentation

## 2019-03-17 LAB — SARS CORONAVIRUS 2 (TAT 6-24 HRS): SARS Coronavirus 2: NEGATIVE

## 2019-03-21 ENCOUNTER — Encounter (HOSPITAL_BASED_OUTPATIENT_CLINIC_OR_DEPARTMENT_OTHER)
Admission: RE | Disposition: A | Discharge status: Home / Self Care | Payer: Self-pay | Source: Home / Self Care | Attending: Otolaryngology

## 2019-03-21 ENCOUNTER — Ambulatory Visit (HOSPITAL_BASED_OUTPATIENT_CLINIC_OR_DEPARTMENT_OTHER): Payer: PPO | Admitting: Certified Registered"

## 2019-03-21 ENCOUNTER — Other Ambulatory Visit: Payer: Self-pay

## 2019-03-21 ENCOUNTER — Ambulatory Visit (HOSPITAL_BASED_OUTPATIENT_CLINIC_OR_DEPARTMENT_OTHER)
Admission: RE | Admit: 2019-03-21 | Discharge: 2019-03-21 | Disposition: A | Discharge status: Home / Self Care | Payer: PPO | Attending: Otolaryngology | Admitting: Otolaryngology

## 2019-03-21 ENCOUNTER — Encounter (HOSPITAL_BASED_OUTPATIENT_CLINIC_OR_DEPARTMENT_OTHER): Payer: Self-pay

## 2019-03-21 DIAGNOSIS — J342 Deviated nasal septum: Secondary | ICD-10-CM | POA: Insufficient documentation

## 2019-03-21 DIAGNOSIS — F17200 Nicotine dependence, unspecified, uncomplicated: Secondary | ICD-10-CM | POA: Diagnosis not present

## 2019-03-21 DIAGNOSIS — Z885 Allergy status to narcotic agent status: Secondary | ICD-10-CM | POA: Diagnosis not present

## 2019-03-21 DIAGNOSIS — J3489 Other specified disorders of nose and nasal sinuses: Secondary | ICD-10-CM | POA: Diagnosis not present

## 2019-03-21 DIAGNOSIS — J449 Chronic obstructive pulmonary disease, unspecified: Secondary | ICD-10-CM | POA: Diagnosis not present

## 2019-03-21 DIAGNOSIS — J343 Hypertrophy of nasal turbinates: Secondary | ICD-10-CM | POA: Insufficient documentation

## 2019-03-21 HISTORY — PX: NASAL SEPTOPLASTY W/ TURBINOPLASTY: SHX2070

## 2019-03-21 HISTORY — PX: ENDOSCOPIC CONCHA BULLOSA RESECTION: SHX6395

## 2019-03-21 SURGERY — SEPTOPLASTY, NOSE, WITH NASAL TURBINATE REDUCTION
Anesthesia: General | Site: Nose | Laterality: Bilateral

## 2019-03-21 MED ORDER — OXYCODONE HCL 5 MG/5ML PO SOLN: 5.0000 mg | Freq: Once | ORAL | Status: AC | PRN

## 2019-03-21 MED ORDER — CEFAZOLIN SODIUM-DEXTROSE 2-3 GM-%(50ML) IV SOLR
INTRAVENOUS | Status: DC | PRN
  Administered 2019-03-21: 2 g via INTRAVENOUS

## 2019-03-21 MED ORDER — CEFAZOLIN SODIUM-DEXTROSE 2-4 GM/100ML-% IV SOLN
INTRAVENOUS | Status: AC
  Filled 2019-03-21: qty 100

## 2019-03-21 MED ORDER — PROMETHAZINE HCL 25 MG/ML IJ SOLN: 6.2500 mg | INTRAMUSCULAR | Status: DC | PRN

## 2019-03-21 MED ORDER — PHENYLEPHRINE HCL (PRESSORS) 10 MG/ML IV SOLN
INTRAVENOUS | Status: DC | PRN
  Administered 2019-03-21: 80 ug via INTRAVENOUS

## 2019-03-21 MED ORDER — ACETAMINOPHEN 160 MG/5ML PO SOLN: 325.0000 mg | Freq: Once | ORAL | Status: AC | PRN

## 2019-03-21 MED ORDER — FENTANYL CITRATE (PF) 100 MCG/2ML IJ SOLN
INTRAMUSCULAR | Status: AC
  Filled 2019-03-21: qty 2

## 2019-03-21 MED ORDER — OXYCODONE HCL 5 MG PO TABS
5.0000 mg | ORAL_TABLET | Freq: Once | ORAL | Status: AC | PRN
  Administered 2019-03-21: 5 mg via ORAL

## 2019-03-21 MED ORDER — SCOPOLAMINE 1 MG/3DAYS TD PT72
1.0000 | MEDICATED_PATCH | Freq: Once | TRANSDERMAL | Status: AC
  Administered 2019-03-21: 1 via TRANSDERMAL

## 2019-03-21 MED ORDER — AMOXICILLIN 875 MG PO TABS: 875.0000 mg | ORAL_TABLET | Freq: Two times a day (BID) | ORAL | 0 refills | Status: AC

## 2019-03-21 MED ORDER — ONDANSETRON HCL 4 MG/2ML IJ SOLN
INTRAMUSCULAR | Status: AC
  Filled 2019-03-21: qty 2

## 2019-03-21 MED ORDER — LIDOCAINE 2% (20 MG/ML) 5 ML SYRINGE
INTRAMUSCULAR | Status: DC | PRN
  Administered 2019-03-21: 40 mg via INTRAVENOUS

## 2019-03-21 MED ORDER — PHENYLEPHRINE 40 MCG/ML (10ML) SYRINGE FOR IV PUSH (FOR BLOOD PRESSURE SUPPORT)
PREFILLED_SYRINGE | INTRAVENOUS | Status: AC
  Filled 2019-03-21: qty 10

## 2019-03-21 MED ORDER — ACETAMINOPHEN 325 MG PO TABS
325.0000 mg | ORAL_TABLET | Freq: Once | ORAL | Status: AC | PRN
  Administered 2019-03-21: 650 mg via ORAL

## 2019-03-21 MED ORDER — LACTATED RINGERS IV SOLN: INTRAVENOUS | Status: DC

## 2019-03-21 MED ORDER — MIDAZOLAM HCL 2 MG/2ML IJ SOLN
INTRAMUSCULAR | Status: AC
  Filled 2019-03-21: qty 2

## 2019-03-21 MED ORDER — LIDOCAINE-EPINEPHRINE 1 %-1:100000 IJ SOLN
INTRAMUSCULAR | Status: AC
  Filled 2019-03-21: qty 1

## 2019-03-21 MED ORDER — MEPERIDINE HCL 25 MG/ML IJ SOLN: 6.2500 mg | INTRAMUSCULAR | Status: DC | PRN

## 2019-03-21 MED ORDER — OXYMETAZOLINE HCL 0.05 % NA SOLN
NASAL | Status: DC | PRN
  Administered 2019-03-21: 1 via TOPICAL

## 2019-03-21 MED ORDER — OXYCODONE HCL 5 MG PO TABS
ORAL_TABLET | ORAL | Status: AC
  Filled 2019-03-21: qty 1

## 2019-03-21 MED ORDER — PROPOFOL 10 MG/ML IV BOLUS
INTRAVENOUS | Status: DC | PRN
  Administered 2019-03-21: 120 mg via INTRAVENOUS

## 2019-03-21 MED ORDER — ACETAMINOPHEN 325 MG PO TABS
ORAL_TABLET | ORAL | Status: AC
  Filled 2019-03-21: qty 2

## 2019-03-21 MED ORDER — FENTANYL CITRATE (PF) 100 MCG/2ML IJ SOLN
50.0000 ug | INTRAMUSCULAR | Status: DC | PRN
  Administered 2019-03-21: 50 ug via INTRAVENOUS

## 2019-03-21 MED ORDER — FENTANYL CITRATE (PF) 100 MCG/2ML IJ SOLN
25.0000 ug | INTRAMUSCULAR | Status: DC | PRN
  Administered 2019-03-21 (×2): 50 ug via INTRAVENOUS

## 2019-03-21 MED ORDER — ACETAMINOPHEN 10 MG/ML IV SOLN: 1000.0000 mg | Freq: Once | INTRAVENOUS | Status: DC | PRN

## 2019-03-21 MED ORDER — OXYCODONE-ACETAMINOPHEN 5-325 MG PO TABS: 1.0000 | ORAL_TABLET | ORAL | 0 refills | Status: AC | PRN

## 2019-03-21 MED ORDER — MIDAZOLAM HCL 2 MG/2ML IJ SOLN
1.0000 mg | INTRAMUSCULAR | Status: DC | PRN
  Administered 2019-03-21: 10:00:00 2 mg via INTRAVENOUS

## 2019-03-21 MED ORDER — ONDANSETRON HCL 4 MG/2ML IJ SOLN
INTRAMUSCULAR | Status: DC | PRN
  Administered 2019-03-21: 4 mg via INTRAVENOUS

## 2019-03-21 MED ORDER — OXYMETAZOLINE HCL 0.05 % NA SOLN
NASAL | Status: AC
  Filled 2019-03-21: qty 30

## 2019-03-21 MED ORDER — PROPOFOL 10 MG/ML IV BOLUS
INTRAVENOUS | Status: AC
  Filled 2019-03-21: qty 20

## 2019-03-21 MED ORDER — LIDOCAINE-EPINEPHRINE 1 %-1:100000 IJ SOLN
INTRAMUSCULAR | Status: DC | PRN
  Administered 2019-03-21: 4 mL

## 2019-03-21 MED ORDER — ROCURONIUM BROMIDE 100 MG/10ML IV SOLN
INTRAVENOUS | Status: DC | PRN
  Administered 2019-03-21: 40 mg via INTRAVENOUS

## 2019-03-21 MED ORDER — SUGAMMADEX SODIUM 200 MG/2ML IV SOLN
INTRAVENOUS | Status: DC | PRN
  Administered 2019-03-21: 200 mg via INTRAVENOUS

## 2019-03-21 MED ORDER — BACITRACIN ZINC 500 UNIT/GM EX OINT
TOPICAL_OINTMENT | CUTANEOUS | Status: AC
  Filled 2019-03-21: qty 28.35

## 2019-03-21 MED ORDER — MUPIROCIN 2 % EX OINT
TOPICAL_OINTMENT | CUTANEOUS | Status: AC
  Filled 2019-03-21: qty 22

## 2019-03-21 MED ORDER — DEXAMETHASONE SODIUM PHOSPHATE 4 MG/ML IJ SOLN
INTRAMUSCULAR | Status: DC | PRN
  Administered 2019-03-21: 5 mg via INTRAVENOUS

## 2019-03-21 MED ORDER — MUPIROCIN 2 % EX OINT
TOPICAL_OINTMENT | CUTANEOUS | Status: DC | PRN
  Administered 2019-03-21: 1 via NASAL

## 2019-03-21 MED ORDER — LACTATED RINGERS IV SOLN
INTRAVENOUS | Status: DC
  Administered 2019-03-21 (×2): via INTRAVENOUS

## 2019-03-21 SURGICAL SUPPLY — 49 items
ATTRACTOMAT 16X20 MAGNETIC DRP (DRAPES) IMPLANT
BLADE TRICUT ROTATE M4 4 5PK (BLADE) ×1 IMPLANT
CANISTER SUC SOCK COL 7IN (MISCELLANEOUS) ×2 IMPLANT
CANISTER SUCT 1200ML W/VALVE (MISCELLANEOUS) ×2 IMPLANT
COAGULATOR SUCT 8FR VV (MISCELLANEOUS) ×2 IMPLANT
COAGULATOR SUCT SWTCH 10FR 6 (ELECTROSURGICAL) IMPLANT
COVER WAND RF STERILE (DRAPES) IMPLANT
DECANTER SPIKE VIAL GLASS SM (MISCELLANEOUS) IMPLANT
DRSG NASOPORE 8CM (GAUZE/BANDAGES/DRESSINGS) IMPLANT
DRSG TELFA 3X8 NADH (GAUZE/BANDAGES/DRESSINGS) IMPLANT
ELECT REM PT RETURN 9FT ADLT (ELECTROSURGICAL) ×2
ELECTRODE REM PT RTRN 9FT ADLT (ELECTROSURGICAL) ×1 IMPLANT
GLOVE BIO SURGEON STRL SZ7.5 (GLOVE) ×2 IMPLANT
GLOVE BIOGEL PI IND STRL 7.0 (GLOVE) IMPLANT
GLOVE BIOGEL PI INDICATOR 7.0 (GLOVE) ×1
GLOVE ECLIPSE 6.5 STRL STRAW (GLOVE) ×1 IMPLANT
GOWN STRL REUS W/ TWL LRG LVL3 (GOWN DISPOSABLE) ×2 IMPLANT
GOWN STRL REUS W/TWL LRG LVL3 (GOWN DISPOSABLE) ×4
HEMOSTAT SURGICEL 2X14 (HEMOSTASIS) IMPLANT
IV NS 500ML (IV SOLUTION) ×2
IV NS 500ML BAXH (IV SOLUTION) IMPLANT
IV SET EXT 30 76VOL 4 MALE LL (IV SETS) IMPLANT
NDL HYPO 25X1 1.5 SAFETY (NEEDLE) ×1 IMPLANT
NDL SPNL 25GX3.5 QUINCKE BL (NEEDLE) IMPLANT
NEEDLE HYPO 25X1 1.5 SAFETY (NEEDLE) ×2 IMPLANT
NEEDLE SPNL 25GX3.5 QUINCKE BL (NEEDLE) IMPLANT
NS IRRIG 1000ML POUR BTL (IV SOLUTION) ×2 IMPLANT
PACK BASIN DAY SURGERY FS (CUSTOM PROCEDURE TRAY) ×2 IMPLANT
PACK ENT DAY SURGERY (CUSTOM PROCEDURE TRAY) ×2 IMPLANT
PAD DRESSING TELFA 3X8 NADH (GAUZE/BANDAGES/DRESSINGS) IMPLANT
SLEEVE SCD COMPRESS KNEE MED (MISCELLANEOUS) IMPLANT
SOLUTION BUTLER CLEAR DIP (MISCELLANEOUS) ×3 IMPLANT
SPLINT NASAL AIRWAY SILICONE (MISCELLANEOUS) ×1 IMPLANT
SPONGE GAUZE 2X2 8PLY STRL LF (GAUZE/BANDAGES/DRESSINGS) ×2 IMPLANT
SPONGE NEURO XRAY DETECT 1X3 (DISPOSABLE) ×2 IMPLANT
SUCTION FRAZIER HANDLE 10FR (MISCELLANEOUS)
SUCTION TUBE FRAZIER 10FR DISP (MISCELLANEOUS) IMPLANT
SUT CHROMIC 4 0 P 3 18 (SUTURE) ×2 IMPLANT
SUT ETHILON 3 0 PS 1 (SUTURE) IMPLANT
SUT PLAIN 4 0 ~~LOC~~ 1 (SUTURE) ×2 IMPLANT
SUT PROLENE 3 0 PS 2 (SUTURE) ×1 IMPLANT
SUT VIC AB 4-0 P-3 18XBRD (SUTURE) IMPLANT
SUT VIC AB 4-0 P3 18 (SUTURE)
SYR 50ML LL SCALE MARK (SYRINGE) IMPLANT
TOWEL GREEN STERILE FF (TOWEL DISPOSABLE) ×2 IMPLANT
TUBE CONNECTING 20X1/4 (TUBING) ×1 IMPLANT
TUBE SALEM SUMP 12R W/ARV (TUBING) IMPLANT
TUBE SALEM SUMP 16 FR W/ARV (TUBING) ×2 IMPLANT
YANKAUER SUCT BULB TIP NO VENT (SUCTIONS) ×2 IMPLANT

## 2019-03-21 NOTE — H&P (Signed)
Cc: Chronic nasal obstruction  HPI: The patient is a 63 year old female who returns today for her follow-up evaluation. The patient was previously seen for chronic nasal congestion, recurrent sinusitis, and frequent headaches.  She was previously treated with multiple courses of antibiotics, Flonase, Azelastine and Atrovent nasal sprays.  She also underwent a sinus CT scan.  The CT showed no significant acute or chronic sinusitis.  She does have bilateral concha bullosa, significantly larger on the right side.  She also has bidirectional nasal septal spur and bilateral inferior turbinate hypertrophy.   A small septal perforation was noted, secondary to her previous facial trauma.  The patient returns today complaining of persistent nasal congestion and recurrent headaches.  Her headache is mostly over her forehead region.  She denies any purulent drainage, fever, or visual change.  No other ENT, GI, or respiratory issue noted since the last visit.   Exam General: Communicates without difficulty, well nourished, no acute distress. Head: Normocephalic, no evidence injury, no tenderness, facial buttresses intact without stepoff. Eyes: PERRL, EOMI. No scleral icterus, conjunctivae clear. Neuro: CN II exam reveals vision grossly intact.  No nystagmus at any point of gaze. Ears: Auricles well formed without lesions.  Ear canals are intact without mass or lesion.  No erythema or edema is appreciated.  The TMs are intact without fluid. Nose: External evaluation reveals normal support and skin without lesions.  Dorsum is intact.  Anterior rhinoscopy reveals congested and edematous mucosa over anterior aspect of the inferior turbinates and deviated nasal septum.  No purulence is noted. Middle meatus is not well visualized. Oral:  Oral cavity and oropharynx are intact, symmetric, without erythema or edema.  Mucosa is moist without lesions. Neck: Full range of motion without pain.  There is no significant lymphadenopathy.   No masses palpable.  Thyroid bed within normal limits to palpation.  Parotid glands and submandibular glands equal bilaterally without mass.  Trachea is midline. Neuro:  CN 2-12 grossly intact. Gait normal. Vestibular: No nystagmus at any point of gaze.   Assessment 1.  Chronic rhinitis with nasal mucosal congestion, bidirectional nasal septal deviation, bilateral inferior turbinate hypertrophy, and bilateral concha bullosa.  2.  No significant acute or chronic sinusitis is noted on her recent CT scan.  3.  The patient's headache is likely neurogenic in etiology.   Plan  1.  The physical exam and the CT images are extensively reviewed with the patient.  2.  The patient is reassured that no acute infection is noted today.  3.  The patient should continue with her current allergy treatment regimen of Flonase, Azelastine, and Atrovent nasal sprays.   4.  In light of her persistent symptoms, she may also benefit from undergoing surgical intervention with septoplasty, bilateral turbinate reduction, and bilateral endoscopic concha bullosa resection.  The risks, benefits, alternatives and details of the procedures are reviewed with the  patient.

## 2019-03-21 NOTE — Anesthesia Preprocedure Evaluation (Addendum)
Anesthesia Evaluation    Reviewed: Allergy & Precautions, Patient's Chart, lab work & pertinent test results  History of Anesthesia Complications (+) PONV  Airway Mallampati: I  TM Distance: >3 FB Neck ROM: Full    Dental  (+) Teeth Intact, Dental Advisory Given   Pulmonary COPD, Current Smoker and Patient abstained from smoking.,    breath sounds clear to auscultation       Cardiovascular negative cardio ROS   Rhythm:Regular Rate:Normal     Neuro/Psych negative neurological ROS  negative psych ROS   GI/Hepatic negative GI ROS, Neg liver ROS,   Endo/Other  negative endocrine ROS  Renal/GU negative Renal ROS     Musculoskeletal negative musculoskeletal ROS (+)   Abdominal Normal abdominal exam  (+)   Peds  Hematology negative hematology ROS (+)   Anesthesia Other Findings   Reproductive/Obstetrics                            Anesthesia Physical Anesthesia Plan  ASA: III  Anesthesia Plan: General   Post-op Pain Management:    Induction: Intravenous  PONV Risk Score and Plan: 4 or greater and Ondansetron, Dexamethasone, Midazolam and Scopolamine patch - Pre-op  Airway Management Planned: Oral ETT  Additional Equipment: None  Intra-op Plan:   Post-operative Plan: Extubation in OR  Informed Consent: I have reviewed the patients History and Physical, chart, labs and discussed the procedure including the risks, benefits and alternatives for the proposed anesthesia with the patient or authorized representative who has indicated his/her understanding and acceptance.     Dental advisory given  Plan Discussed with: CRNA  Anesthesia Plan Comments:        Anesthesia Quick Evaluation

## 2019-03-21 NOTE — Anesthesia Procedure Notes (Signed)
Procedure Name: Intubation Date/Time: 03/21/2019 9:50 AM Performed by: Maryella Shivers, CRNA Pre-anesthesia Checklist: Patient identified, Emergency Drugs available, Suction available and Patient being monitored Patient Re-evaluated:Patient Re-evaluated prior to induction Oxygen Delivery Method: Circle system utilized Preoxygenation: Pre-oxygenation with 100% oxygen Induction Type: IV induction Ventilation: Mask ventilation without difficulty Laryngoscope Size: Mac and 3 Tube type: Oral Rae Tube size: 7.0 mm Number of attempts: 1 Airway Equipment and Method: Stylet and Oral airway Placement Confirmation: ETT inserted through vocal cords under direct vision,  positive ETCO2 and breath sounds checked- equal and bilateral Secured at: 20 cm Tube secured with: Tape Dental Injury: Teeth and Oropharynx as per pre-operative assessment

## 2019-03-21 NOTE — Op Note (Signed)
DATE OF PROCEDURE: 03/21/2019  OPERATIVE REPORT   SURGEON: Leta Baptist, MD   PREOPERATIVE DIAGNOSES:  1. Severe nasal septal deviation.  2. Bilateral inferior turbinate hypertrophy.  3. Bilateral concha bullosa. 4. Chronic nasal obstruction.  POSTOPERATIVE DIAGNOSES:  1. Severe nasal septal deviation.  2. Bilateral inferior turbinate hypertrophy.  3. Bilateral concha bullosa. 4. Chronic nasal obstruction.  PROCEDURE PERFORMED:  1. Septoplasty.  2. Bilateral partial inferior turbinate resection.  3. Bilateral endoscopic concha bullosa resection.  ANESTHESIA: General endotracheal tube anesthesia.   COMPLICATIONS: None.   ESTIMATED BLOOD LOSS: 40 mL.   INDICATION FOR PROCEDURE: [name] is a 63 y.o. female with a history of chronic nasal obstruction. The patient was previously treated with antihistamine, decongestant, and steroid nasal sprays. However, the patient continued to be symptomatic. On examination, the patient was noted to have bilateral inferior turbinate hypertrophy and bilateral septal spurs, causing significant nasal obstruction. Her CT scan also showed bilateral large concha bullosa. She has a history of a septal perforation secondary to previous nasal trauma.  Based on the above findings, the decision was made for the patient to undergo the above-stated procedures. The risks, benefits, alternatives, and details of the procedures were discussed with the patient. Questions were invited and answered. Informed consent was obtained.   DESCRIPTION OF PROCEDURE: The patient was taken to the operating room and placed supine on the operating table. General endotracheal tube anesthesia was administered by the anesthesiologist. The patient was positioned, and prepped and draped in the standard fashion for nasal surgery. Pledgets soaked with Afrin were placed in both nasal cavities for decongestion. The pledgets were subsequently removed.   Examination of the nasal cavity  revealed a severe nasal septal deviationd. 1% lidocaine with 1:100,000 epinephrine was injected onto the nasal septum bilaterally. A hemitransfixion incision was made on the left side. The mucosal flap was carefully elevated on the left side. A cartilaginous incision was made 1 cm superior to the caudal margin of the nasal septum. Mucosal flap was also elevated on the right side in the similar fashion. It should be noted that due to the septal deviation, the deviated portion of the cartilaginous and bony septum had to be removed in piecemeal fashion. Once the deviated portions were removed, a straight midline septum was achieved. The septum was then quilted with 4-0 plain gut sutures. The hemitransfixion incision was closed with interrupted 4-0 chromic sutures.   The inferior one half of both hypertrophied inferior turbinate was crossclamped with a Kelly clamp. The inferior one half of each inferior turbinate was then resected with a pair of cross cutting scissors. Hemostasis was achieved with a suction cautery device.   Using a 0 endoscope, the left nasal cavity was examined. A large concha bullosa was noted. Using Tru-Cut forceps, the inferior one third and medial one half of the concha bullosa was resected. The same procedure was repeated on the right side without exception. Doyle splints were applied to the nasal septum.  The care of the patient was turned over to the anesthesiologist. The patient was awakened from anesthesia without difficulty. The patient was extubated and transferred to the recovery room in good condition.   OPERATIVE FINDINGS: Nasal septal deviation and bilateral inferior turbinate hypertrophy. Bilateral concha bullosa.  SPECIMEN: None.   FOLLOWUP CARE: The patient be discharged home once she is awake and alert. The patient will be placed on Percocet 1 tablets p.o. q.4 hours p.r.n. pain, and amoxicillin 875 mg p.o. b.i.d. for 5 days.  The patient will follow up in my office in 3  days for splint removal.    Raynelle Bring, MD

## 2019-03-21 NOTE — Discharge Instructions (Addendum)
Post Anesthesia Home Care Instructions  Activity: Get plenty of rest for the remainder of the day. A responsible individual must stay with you for 24 hours following the procedure.  For the next 24 hours, DO NOT: -Drive a car -Operate machinery -Drink alcoholic beverages -Take any medication unless instructed by your physician -Make any legal decisions or sign important papers.  Meals: Start with liquid foods such as gelatin or soup. Progress to regular foods as tolerated. Avoid greasy, spicy, heavy foods. If nausea and/or vomiting occur, drink only clear liquids until the nausea and/or vomiting subsides. Call your physician if vomiting continues.  Special Instructions/Symptoms: Your throat may feel dry or sore from the anesthesia or the breathing tube placed in your throat during surgery. If this causes discomfort, gargle with warm salt water. The discomfort should disappear within 24 hours.  If you had a scopolamine patch placed behind your ear for the management of post- operative nausea and/or vomiting:  1. The medication in the patch is effective for 72 hours, after which it should be removed.  Wrap patch in a tissue and discard in the trash. Wash hands thoroughly with soap and water. 2. You may remove the patch earlier than 72 hours if you experience unpleasant side effects which may include dry mouth, dizziness or visual disturbances. 3. Avoid touching the patch. Wash your hands with soap and water after contact with the patch.    -----------------  POSTOPERATIVE INSTRUCTIONS FOR PATIENTS HAVING NASAL OR SINUS OPERATIONS ACTIVITY: Restrict activity at home for the first two days, resting as much as possible. Light activity is best. You may usually return to work within a week. You should refrain from nose blowing, strenuous activity, or heavy lifting greater than 20lbs for a total of one week after your operation.  If sneezing cannot be avoided, sneeze with your mouth  open. DISCOMFORT: You may experience a dull headache and pressure along with nasal congestion and discharge. These symptoms may be worse during the first week after the operation but may last as long as two to four weeks.  Please take Tylenol or the pain medication that has been prescribed for you. Do not take aspirin or aspirin containing medications since they may cause bleeding.  You may experience symptoms of post nasal drainage, nasal congestion, headaches and fatigue for two or three months after your operation.  BLEEDING: You may have some blood tinged nasal drainage for approximately two weeks after the operation.  The discharge will be worse for the first week.  Please call our office at [phone] or go to the nearest hospital emergency room if you experience any of the following: heavy, bright red blood from your nose or mouth that lasts longer than 15 minutes or coughing up or vomiting bright red blood or blood clots. GENERAL CONSIDERATIONS: 1. A gauze dressing will be placed on your upper lip to absorb any drainage after the operation. You may need to change this several times a day.  If you do not have very much drainage, you may remove the dressing.  Remember that you may gently wipe your nose with a tissue and sniff in, but DO NOT blow your nose. 2. Please keep all of your postoperative appointments.  Your final results after the operation will depend on proper follow-up.  The initial visit is usually 2 to 5 days after the operation.  During this visit, the remaining nasal packing and internal septal splints will be removed.  Your nasal and sinus cavities will   be cleaned.  During the second visit, your nasal and sinus cavities will be cleaned again. Have someone drive you to your first two postoperative appointments.  3. How you care for your nose after the operation will influence the results that you obtain.  You should follow all directions, take your medication as prescribed, and call  our office [phone] with any problems or questions. 4. You may be more comfortable sleeping with your head elevated on two pillows. 5. Do not take any medications that we have not prescribed or recommended. WARNING SIGNS: if any of the following should occur, please call our office: 1. Persistent fever greater than 102F. 2. Persistent vomiting. 3. Severe and constant pain that is not relieved by prescribed pain medication. 4. Trauma to the nose. 5. Rash or unusual side effects from any medicines.  

## 2019-03-21 NOTE — Transfer of Care (Signed)
Immediate Anesthesia Transfer of Care Note  Patient: [name]  Procedure(s) Performed: NASAL SEPTOPLASTY WITH BILATERAL TURBINATE REDUCTION (Bilateral Nose) ENDOSCOPIC CONCHA BULLOSA RESECTION (Bilateral Nose)  Patient Location: PACU  Anesthesia Type:General  Level of Consciousness: sedated  Airway & Oxygen Therapy: Patient Spontanous Breathing and Patient connected to face mask oxygen  Post-op Assessment: Report given to RN and Post -op Vital signs reviewed and stable  Post vital signs: Reviewed and stable  Last Vitals:  Vitals Value Taken Time  BP 140/72 03/21/19 1040  Temp    Pulse 65 03/21/19 1043  Resp 23 03/21/19 1043  SpO2 99 % 03/21/19 1043  Vitals shown include unvalidated device data.  Last Pain:  Vitals:   03/21/19 0830  TempSrc: Oral  PainSc: 0-No pain         Complications: No apparent anesthesia complications

## 2019-03-21 NOTE — Anesthesia Postprocedure Evaluation (Signed)
Anesthesia Post Note  Patient: HILDE CASHMORE  Procedure(s) Performed: NASAL SEPTOPLASTY WITH BILATERAL TURBINATE REDUCTION (Bilateral Nose) ENDOSCOPIC CONCHA BULLOSA RESECTION (Bilateral Nose)     Patient location during evaluation: PACU Anesthesia Type: General Level of consciousness: awake and alert Pain management: pain level controlled Vital Signs Assessment: post-procedure vital signs reviewed and stable Respiratory status: spontaneous breathing, nonlabored ventilation, respiratory function stable and patient connected to nasal cannula oxygen Cardiovascular status: blood pressure returned to baseline and stable Postop Assessment: no apparent nausea or vomiting Anesthetic complications: no    Last Vitals:  Vitals:   03/21/19 1130 03/21/19 1145  BP: (!) 146/84 134/89  Pulse: 68 72  Resp: 13 16  Temp:    SpO2: 97% 100%    Last Pain:  Vitals:   03/21/19 1145  TempSrc:   PainSc: 6                   L 

## 2019-03-22 ENCOUNTER — Encounter (HOSPITAL_BASED_OUTPATIENT_CLINIC_OR_DEPARTMENT_OTHER): Payer: Self-pay | Admitting: Otolaryngology

## 2019-03-24 ENCOUNTER — Ambulatory Visit (INDEPENDENT_AMBULATORY_CARE_PROVIDER_SITE_OTHER): Payer: PPO | Admitting: Otolaryngology

## 2019-03-24 ENCOUNTER — Other Ambulatory Visit: Payer: Self-pay

## 2019-04-07 ENCOUNTER — Ambulatory Visit (INDEPENDENT_AMBULATORY_CARE_PROVIDER_SITE_OTHER): Payer: PPO | Admitting: Otolaryngology

## 2019-04-25 ENCOUNTER — Ambulatory Visit: Payer: PPO | Admitting: *Deleted

## 2019-08-08 ENCOUNTER — Encounter: Payer: Self-pay | Admitting: Urology

## 2019-08-18 DIAGNOSIS — J441 Chronic obstructive pulmonary disease with (acute) exacerbation: Secondary | ICD-10-CM | POA: Diagnosis not present

## 2019-08-21 DIAGNOSIS — J449 Chronic obstructive pulmonary disease, unspecified: Secondary | ICD-10-CM | POA: Diagnosis not present

## 2019-08-21 DIAGNOSIS — Z720 Tobacco use: Secondary | ICD-10-CM | POA: Diagnosis not present

## 2019-08-21 DIAGNOSIS — I10 Essential (primary) hypertension: Secondary | ICD-10-CM | POA: Diagnosis not present

## 2019-08-21 DIAGNOSIS — E7849 Other hyperlipidemia: Secondary | ICD-10-CM | POA: Diagnosis not present

## 2019-08-22 ENCOUNTER — Other Ambulatory Visit: Payer: Self-pay

## 2019-08-22 ENCOUNTER — Ambulatory Visit: Payer: PPO | Attending: Family

## 2019-08-22 DIAGNOSIS — Z20822 Contact with and (suspected) exposure to covid-19: Secondary | ICD-10-CM | POA: Diagnosis not present

## 2019-08-24 LAB — NOVEL CORONAVIRUS, NAA: SARS-CoV-2, NAA: NOT DETECTED

## 2019-09-22 DIAGNOSIS — J309 Allergic rhinitis, unspecified: Secondary | ICD-10-CM | POA: Diagnosis not present

## 2019-09-22 DIAGNOSIS — J069 Acute upper respiratory infection, unspecified: Secondary | ICD-10-CM | POA: Diagnosis not present

## 2019-09-22 DIAGNOSIS — J449 Chronic obstructive pulmonary disease, unspecified: Secondary | ICD-10-CM | POA: Diagnosis not present

## 2019-10-04 DIAGNOSIS — B359 Dermatophytosis, unspecified: Secondary | ICD-10-CM | POA: Diagnosis not present

## 2019-10-04 DIAGNOSIS — Z6829 Body mass index (BMI) 29.0-29.9, adult: Secondary | ICD-10-CM | POA: Diagnosis not present

## 2019-10-04 DIAGNOSIS — J22 Unspecified acute lower respiratory infection: Secondary | ICD-10-CM | POA: Diagnosis not present

## 2019-10-04 DIAGNOSIS — E663 Overweight: Secondary | ICD-10-CM | POA: Diagnosis not present

## 2019-10-06 DIAGNOSIS — J343 Hypertrophy of nasal turbinates: Secondary | ICD-10-CM | POA: Diagnosis not present

## 2019-10-06 DIAGNOSIS — J310 Chronic rhinitis: Secondary | ICD-10-CM | POA: Diagnosis not present

## 2019-10-06 DIAGNOSIS — J342 Deviated nasal septum: Secondary | ICD-10-CM | POA: Diagnosis not present

## 2019-10-10 ENCOUNTER — Ambulatory Visit (HOSPITAL_COMMUNITY)
Admission: RE | Admit: 2019-10-10 | Discharge: 2019-10-10 | Disposition: A | Discharge status: Home / Self Care | Payer: PPO | Source: Ambulatory Visit | Attending: Physician Assistant | Admitting: Physician Assistant

## 2019-10-10 ENCOUNTER — Other Ambulatory Visit (HOSPITAL_COMMUNITY): Payer: Self-pay | Admitting: Physician Assistant

## 2019-10-10 ENCOUNTER — Other Ambulatory Visit: Payer: Self-pay

## 2019-10-10 DIAGNOSIS — R05 Cough: Secondary | ICD-10-CM

## 2019-10-10 DIAGNOSIS — R059 Cough, unspecified: Secondary | ICD-10-CM

## 2019-10-12 ENCOUNTER — Ambulatory Visit (INDEPENDENT_AMBULATORY_CARE_PROVIDER_SITE_OTHER): Payer: PPO | Admitting: Urology

## 2019-10-12 ENCOUNTER — Encounter: Payer: Self-pay | Admitting: Urology

## 2019-10-12 VITALS — BP 128/86 | HR 77 | Temp 97.1°F | Ht 66.0 in | Wt 181.0 lb

## 2019-10-12 DIAGNOSIS — N3021 Other chronic cystitis with hematuria: Secondary | ICD-10-CM | POA: Diagnosis not present

## 2019-10-12 DIAGNOSIS — N3281 Overactive bladder: Secondary | ICD-10-CM

## 2019-10-12 MED ORDER — ESTROGENS, CONJUGATED 0.625 MG/GM VA CREA: 1.0000 | TOPICAL_CREAM | VAGINAL | 5 refills | Status: DC

## 2019-10-12 NOTE — Patient Instructions (Signed)

## 2019-10-12 NOTE — Progress Notes (Signed)
Urological Symptom Review  Patient is experiencing the following symptoms: Yeast infection (antibiotics)   Review of Systems  Gastrointestinal (upper)  : Negative for upper GI symptoms  Gastrointestinal (lower) : Negative for lower GI symptoms  Constitutional : Negative for symptoms  Skin: Negative for skin symptoms  Eyes: Negative for eye symptoms  Ear/Nose/Throat : Negative for Ear/Nose/Throat symptoms  Hematologic/Lymphatic: Negative for Hematologic/Lymphatic symptoms  Cardiovascular : Negative for cardiovascular symptoms  Respiratory : Negative for respiratory symptoms  Endocrine: Negative for endocrine symptoms  Musculoskeletal: Negative for musculoskeletal symptoms  Neurological: Negative for neurological symptoms  Psychologic: Negative for psychiatric symptoms

## 2019-10-12 NOTE — Progress Notes (Signed)
10/12/2019 2:34 PM   [name] January 04, 1956 [id number]  Referring provider: Redmond School, MD [address] [place],  [place] [address]  dysuria  HPI: [name] is a 64yo her for followup for chronic cystitis and OAB. Since feb 2020 she has been treated with 3 different antibiotics for respiratory issues since getting the [facility] Vaccine. She has dysuria and vaginal irritations after the last course antibiotics and it has persisted after 3 doses of diflucan 150mg . She has worsening urinary frequency and urgency since taking antibiotics. She is having intermittent dysuria. She was unable to leave urine specimen  Her records from [facility] are as follows: 03/06/17: [name] returns today in f/u her history of recurrent UTI's and voiding symptoms. She has had some intermittency. She has mixed incontinence. She wears 1-2 ppd. She has no dysuria or hematuria. She has a reduced stream. She has been using the topical estrogen and has less pain and dryness but doesn't seem to be able to get the applicator in as far. She has lost about 60lbs with increased activity.   08/01/16: Amber. Saunders is a 64 yo WF who is sent in consutlation by Dr. Gerarda Fraction for recurrent UTI over the last 3 years. She had no UTI's since a hysterectomy about 15 years ago.   12/04/2017: No dysuria. no UTIs since last visit. She is using premarin.   06/09/2018: No UTIs since last visit. She applys estrogen cream 3x per week. She has worsening urge incontinence since last visit.   03/16/2019: No UTIs since last. She uses estrogen cream 2x per week.   PMH: Past Medical History:  Diagnosis Date  . COPD (chronic obstructive pulmonary disease) ([place])   . Diverticulosis   . Hypercholesteremia   . Pneumonia 12/2018   double pneumonia  . PONV (postoperative nausea and vomiting)   . Seasonal allergies   . Shingles   . Varicose veins     Surgical History: Past Surgical History:  Procedure Laterality Date  .  ABDOMINAL HYSTERECTOMY     partial  . BACK SURGERY     cervical surgery  . COLONOSCOPY N/A 02/04/2017   Procedure: COLONOSCOPY;  Surgeon: Rogene Houston, MD;  [facility];  Service: Endoscopy;  Laterality: N/A;  930  . ENDOSCOPIC CONCHA BULLOSA RESECTION Bilateral 03/21/2019   Procedure: ENDOSCOPIC CONCHA BULLOSA RESECTION;  Surgeon: Leta Baptist, MD;  Location: [facility];  Service: ENT;  Laterality: Bilateral;  . NASAL SEPTOPLASTY W/ TURBINOPLASTY Bilateral 03/21/2019   Procedure: NASAL SEPTOPLASTY WITH BILATERAL TURBINATE REDUCTION;  Surgeon: Leta Baptist, MD;  Location: [facility];  Service: ENT;  Laterality: Bilateral;  . TONSILLECTOMY    . WRIST SURGERY     age 5yrs    Home Medications:  Allergies as of 10/12/2019      Reactions   Bee Venom Anaphylaxis   Codeine Nausea And Vomiting   Other Nausea And Vomiting   General anesthesia       Medication List       Accurate as of October 12, 2019  2:34 PM. If you have any questions, ask your nurse or doctor.        albuterol 108 (90 Base) MCG/ACT inhaler Commonly known as: VENTOLIN HFA Inhale into the lungs every 6 (six) hours as needed for wheezing or shortness of breath.   azelastine 0.1 % nasal spray Commonly known as: ASTELIN Place into both nostrils 2 (two) times daily. Use in each nostril as directed  conjugated estrogens vaginal cream Commonly known as: PREMARIN Place 1 Applicatorful vaginally every other day.   Hair Skin and Nails Formula Tabs Take 2 tablets by mouth daily.   MULTIVITAMIN ADULTS PO Take 1 tablet by mouth daily.   nicotine 14 mg/24hr patch Commonly known as: NICODERM CQ - dosed in mg/24 hours Place 14 mg onto the skin daily.   polyethylene glycol 17 g packet Commonly known as: MIRALAX / GLYCOLAX Take 17 g by mouth daily as needed for mild constipation.   Spiriva Respimat 1.25 MCG/ACT Aers Generic drug: Tiotropium Bromide Monohydrate Inhale into the  lungs.   SUPER B COMPLEX PO Take 1 tablet by mouth daily.   triamcinolone cream 0.1 % Commonly known as: KENALOG Apply 1 application topically daily as needed (eczema).   Vitamin D3 50 MCG (2000 UT) capsule Take 4,000 Units by mouth daily.       Allergies:  Allergies  Allergen Reactions  . Bee Venom Anaphylaxis  . Codeine Nausea And Vomiting  . Other Nausea And Vomiting    General anesthesia     Family History: Family History  Problem Relation Age of Onset  . Cancer Mother        breast  . Heart attack Mother   . Colon cancer Neg Hx     Social History:  reports that she has been smoking cigarettes. She has a 22.50 pack-year smoking history. She has never used smokeless tobacco. She reports that she does not drink alcohol or use drugs.  ROS: All other review of systems were reviewed and are negative except what is noted above in HPI  Physical Exam: BP 128/86   Pulse 77   Temp (!) 97.1 F (36.2 C)   Ht 5\' 6"  (1.676 m)   Wt 181 lb (82.1 kg)   BMI 29.21 kg/m   Constitutional:  Alert and oriented, No acute distress. HEENT:  AT, moist mucus membranes.  Trachea midline, no masses. Cardiovascular: No clubbing, cyanosis, or edema. Respiratory: Normal respiratory effort, no increased work of breathing. GI: Abdomen is soft, nontender, nondistended, no abdominal masses GU: No CVA tenderness Lymph: No cervical or inguinal lymphadenopathy. Skin: No rashes, bruises or suspicious lesions. Neurologic: Grossly intact, no focal deficits, moving all 4 extremities. Psychiatric: Normal mood and affect.  Laboratory Data: Lab Results  Component Value Date   WBC 9.1 03/15/2016   HGB 13.3 03/15/2016   HCT 39.0 03/15/2016   MCV 92.2 03/15/2016   PLT 270 03/15/2016    Lab Results  Component Value Date   CREATININE 1.00 03/15/2016    No results found for: PSA  No results found for: TESTOSTERONE  No results found for: HGBA1C  Urinalysis    Component Value  Date/Time   COLORURINE ORANGE (A) 03/15/2016 0946   APPEARANCEUR CLEAR 03/15/2016 0946   LABSPEC <1.005 (L) 03/15/2016 0946   PHURINE 6.5 03/15/2016 0946   GLUCOSEU 100 (A) 03/15/2016 0946   HGBUR NEGATIVE 03/15/2016 0946   BILIRUBINUR NEGATIVE 03/15/2016 0946   KETONESUR NEGATIVE 03/15/2016 0946   PROTEINUR TRACE (A) 03/15/2016 0946   NITRITE POSITIVE (A) 03/15/2016 0946   LEUKOCYTESUR TRACE (A) 03/15/2016 0946    Lab Results  Component Value Date   BACTERIA RARE (A) 03/15/2016    Pertinent Imaging:  No results found for this or any previous visit. No results found for this or any previous visit. No results found for this or any previous visit. No results found for this or any previous visit. No results found  for this or any previous visit. No results found for this or any previous visit. No results found for this or any previous visit. No results found for this or any previous visit.  Assessment & Plan:    1. Chronic cystitis with hematuria -increase topical estrogen daily for 14 days then 3x per week  2. OAB (overactive bladder) -continue topical estrogen.    No follow-ups on file.  Nicolette Bang, MD  [facility] [place]

## 2019-10-19 DIAGNOSIS — I10 Essential (primary) hypertension: Secondary | ICD-10-CM | POA: Diagnosis not present

## 2019-10-19 DIAGNOSIS — Z720 Tobacco use: Secondary | ICD-10-CM | POA: Diagnosis not present

## 2019-10-19 DIAGNOSIS — E7849 Other hyperlipidemia: Secondary | ICD-10-CM | POA: Diagnosis not present

## 2019-10-19 DIAGNOSIS — J449 Chronic obstructive pulmonary disease, unspecified: Secondary | ICD-10-CM | POA: Diagnosis not present

## 2019-10-24 DIAGNOSIS — Z6829 Body mass index (BMI) 29.0-29.9, adult: Secondary | ICD-10-CM | POA: Diagnosis not present

## 2019-10-24 DIAGNOSIS — R5383 Other fatigue: Secondary | ICD-10-CM | POA: Diagnosis not present

## 2019-10-24 DIAGNOSIS — Z0000 Encounter for general adult medical examination without abnormal findings: Secondary | ICD-10-CM | POA: Diagnosis not present

## 2019-10-24 DIAGNOSIS — M549 Dorsalgia, unspecified: Secondary | ICD-10-CM | POA: Diagnosis not present

## 2019-10-24 DIAGNOSIS — J449 Chronic obstructive pulmonary disease, unspecified: Secondary | ICD-10-CM | POA: Diagnosis not present

## 2019-10-24 DIAGNOSIS — Z1389 Encounter for screening for other disorder: Secondary | ICD-10-CM | POA: Diagnosis not present

## 2019-10-24 DIAGNOSIS — E7849 Other hyperlipidemia: Secondary | ICD-10-CM | POA: Diagnosis not present

## 2019-10-24 DIAGNOSIS — Z719 Counseling, unspecified: Secondary | ICD-10-CM | POA: Diagnosis not present

## 2019-11-14 ENCOUNTER — Ambulatory Visit: Payer: PPO | Admitting: Urology

## 2019-11-18 DIAGNOSIS — Z720 Tobacco use: Secondary | ICD-10-CM | POA: Diagnosis not present

## 2019-11-18 DIAGNOSIS — E7849 Other hyperlipidemia: Secondary | ICD-10-CM | POA: Diagnosis not present

## 2019-11-18 DIAGNOSIS — J449 Chronic obstructive pulmonary disease, unspecified: Secondary | ICD-10-CM | POA: Diagnosis not present

## 2019-11-18 DIAGNOSIS — I10 Essential (primary) hypertension: Secondary | ICD-10-CM | POA: Diagnosis not present

## 2019-11-26 ENCOUNTER — Encounter (HOSPITAL_COMMUNITY): Payer: Self-pay

## 2019-11-26 ENCOUNTER — Encounter: Payer: Self-pay | Admitting: Emergency Medicine

## 2019-11-26 ENCOUNTER — Other Ambulatory Visit: Payer: Self-pay

## 2019-11-26 ENCOUNTER — Emergency Department (HOSPITAL_COMMUNITY): Payer: PPO

## 2019-11-26 ENCOUNTER — Telehealth: Payer: Self-pay

## 2019-11-26 ENCOUNTER — Emergency Department (HOSPITAL_COMMUNITY)
Admission: EM | Admit: 2019-11-26 | Discharge: 2019-11-26 | Disposition: A | Discharge status: Home / Self Care | Payer: PPO | Attending: Emergency Medicine | Admitting: Emergency Medicine

## 2019-11-26 ENCOUNTER — Ambulatory Visit
Admission: EM | Admit: 2019-11-26 | Discharge: 2019-11-26 | Disposition: A | Discharge status: Other Acute Inpatient Hospital | Payer: PPO | Source: Home / Self Care

## 2019-11-26 DIAGNOSIS — Z79899 Other long term (current) drug therapy: Secondary | ICD-10-CM | POA: Insufficient documentation

## 2019-11-26 DIAGNOSIS — R079 Chest pain, unspecified: Secondary | ICD-10-CM | POA: Diagnosis not present

## 2019-11-26 DIAGNOSIS — Z20822 Contact with and (suspected) exposure to covid-19: Secondary | ICD-10-CM | POA: Diagnosis not present

## 2019-11-26 DIAGNOSIS — F17210 Nicotine dependence, cigarettes, uncomplicated: Secondary | ICD-10-CM | POA: Diagnosis not present

## 2019-11-26 DIAGNOSIS — Z7982 Long term (current) use of aspirin: Secondary | ICD-10-CM | POA: Diagnosis not present

## 2019-11-26 DIAGNOSIS — J441 Chronic obstructive pulmonary disease with (acute) exacerbation: Secondary | ICD-10-CM

## 2019-11-26 DIAGNOSIS — R0902 Hypoxemia: Secondary | ICD-10-CM

## 2019-11-26 DIAGNOSIS — R0602 Shortness of breath: Secondary | ICD-10-CM | POA: Diagnosis not present

## 2019-11-26 LAB — BRAIN NATRIURETIC PEPTIDE: B Natriuretic Peptide: 19 pg/mL (ref 0.0–100.0)

## 2019-11-26 LAB — BLOOD GAS, ARTERIAL
Acid-Base Excess: 1.7 mmol/L (ref 0.0–2.0)
Bicarbonate: 25.9 mmol/L (ref 20.0–28.0)
FIO2: 36
O2 Saturation: 96.8 %
Patient temperature: 36.7
pCO2 arterial: 40 mmHg (ref 32.0–48.0)
pH, Arterial: 7.423 (ref 7.350–7.450)
pO2, Arterial: 89.9 mmHg (ref 83.0–108.0)

## 2019-11-26 LAB — COMPREHENSIVE METABOLIC PANEL
ALT: 21 U/L (ref 0–44)
AST: 25 U/L (ref 15–41)
Albumin: 4 g/dL (ref 3.5–5.0)
Alkaline Phosphatase: 77 U/L (ref 38–126)
Anion gap: 7 (ref 5–15)
BUN: 18 mg/dL (ref 8–23)
CO2: 28 mmol/L (ref 22–32)
Calcium: 8.9 mg/dL (ref 8.9–10.3)
Chloride: 103 mmol/L (ref 98–111)
Creatinine, Ser: 0.7 mg/dL (ref 0.44–1.00)
GFR calc Af Amer: >60 mL/min (ref >60)
GFR calc non Af Amer: >60 mL/min (ref >60)
Glucose, Bld: 94 mg/dL (ref 70–99)
Potassium: 4.4 mmol/L (ref 3.5–5.1)
Sodium: 138 mmol/L (ref 135–145)
Total Bilirubin: 0.7 mg/dL (ref 0.3–1.2)
Total Protein: 7 g/dL (ref 6.5–8.1)

## 2019-11-26 LAB — CBC WITH DIFFERENTIAL/PLATELET
Abs Immature Granulocytes: 0.02 10*3/uL (ref 0.00–0.07)
Basophils Absolute: 0.1 10*3/uL (ref 0.0–0.1)
Basophils Relative: 1 %
Eosinophils Absolute: 0.7 10*3/uL — ABNORMAL HIGH (ref 0.0–0.5)
Eosinophils Relative: 8 %
HCT: 40.4 % (ref 36.0–46.0)
Hemoglobin: 13.2 g/dL (ref 12.0–15.0)
Immature Granulocytes: 0 %
Lymphocytes Relative: 36 %
Lymphs Abs: 3 10*3/uL (ref 0.7–4.0)
MCH: 31.4 pg (ref 26.0–34.0)
MCHC: 32.7 g/dL (ref 30.0–36.0)
MCV: 96 fL (ref 80.0–100.0)
Monocytes Absolute: 0.6 10*3/uL (ref 0.1–1.0)
Monocytes Relative: 7 %
Neutro Abs: 4 10*3/uL (ref 1.7–7.7)
Neutrophils Relative %: 48 %
Platelets: 277 10*3/uL (ref 150–400)
RBC: 4.21 MIL/uL (ref 3.87–5.11)
RDW: 12.3 % (ref 11.5–15.5)
WBC: 8.3 10*3/uL (ref 4.0–10.5)
nRBC: 0 % (ref 0.0–0.2)

## 2019-11-26 LAB — URINALYSIS, ROUTINE W REFLEX MICROSCOPIC
Bilirubin Urine: NEGATIVE
Glucose, UA: NEGATIVE mg/dL
Hgb urine dipstick: NEGATIVE
Ketones, ur: NEGATIVE mg/dL
Leukocytes,Ua: NEGATIVE
Nitrite: NEGATIVE
Protein, ur: NEGATIVE mg/dL
Specific Gravity, Urine: 1.008 (ref 1.005–1.030)
pH: 8 (ref 5.0–8.0)

## 2019-11-26 LAB — RESPIRATORY PANEL BY RT PCR (FLU A&B, COVID)
Influenza A by PCR: NEGATIVE
Influenza B by PCR: NEGATIVE
SARS Coronavirus 2 by RT PCR: NEGATIVE

## 2019-11-26 LAB — D-DIMER, QUANTITATIVE: D-Dimer, Quant: 0.42 ug/mL-FEU (ref 0.00–0.50)

## 2019-11-26 LAB — TROPONIN I (HIGH SENSITIVITY)
Troponin I (High Sensitivity): 3 ng/L (ref <18)
Troponin I (High Sensitivity): 4 ng/L (ref <18)

## 2019-11-26 MED ORDER — IPRATROPIUM-ALBUTEROL 0.5-2.5 (3) MG/3ML IN SOLN: 3.0000 mL | Freq: Four times a day (QID) | RESPIRATORY_TRACT | 0 refills | Status: DC | PRN

## 2019-11-26 MED ORDER — METHYLPREDNISOLONE SODIUM SUCC 125 MG IJ SOLR
125.0000 mg | Freq: Once | INTRAMUSCULAR | Status: AC
  Administered 2019-11-26: 125 mg via INTRAVENOUS
  Filled 2019-11-26: qty 2

## 2019-11-26 MED ORDER — IPRATROPIUM-ALBUTEROL 0.5-2.5 (3) MG/3ML IN SOLN
3.0000 mL | Freq: Once | RESPIRATORY_TRACT | Status: AC
  Administered 2019-11-26: 3 mL via RESPIRATORY_TRACT
  Filled 2019-11-26: qty 3

## 2019-11-26 MED ORDER — PREDNISONE 50 MG PO TABS
50.0000 mg | ORAL_TABLET | Freq: Every day | ORAL | 0 refills | Status: DC
Start: 2019-11-26 — End: 2019-12-07

## 2019-11-26 NOTE — TOC Initial Note (Signed)
Transition of Care [facility]) - Initial/Assessment Note    Patient Details  Name: [name] MRN: [id number] Date of Birth: 06-29-1956  Transition of [facility] CM/SW Contact:    Verdell Carmine, RN Phone Number:[phone] 11/26/2019, 12:54 PM  Clinical Narrative:                 Patient came in  the Emergency department with shortness of breath.  Current smoker states she has been sob since she received COVID vaccine 2 months ago O2 saturation in the 80's on room air. Called by MD at [facility], states he was going to admit, but would prefer just to have home o2 set up and wondered if this was possible. Explained qualifying for home o2 .  PA called from [facility], Explained walking saturations and how to qualify for home o2.  Will order testing.   Expected Discharge Plan: Home/Self Care Barriers to Discharge: No Barriers Identified   Patient Goals and CMS Choice        Expected Discharge Plan and Services Expected Discharge Plan: Home/Self Care                                              Prior Living Arrangements/Services                       Activities of Daily Living      Permission Sought/Granted                  Emotional Assessment              Admission diagnosis:  Shortnees of breath Patient Active Problem List   Diagnosis Date Noted  . Chronic cystitis with hematuria 10/12/2019  . OAB (overactive bladder) 10/12/2019  . Special screening for malignant neoplasms, colon 10/03/2016  . Varicose veins of left lower extremity with complications [id number]   PCP:  Redmond School, MD Pharmacy:   [place], [place] - [address] [place] [address] [place] [address] Phone: [phone] Fax: [place], [place] [address] Phone: [phone] Fax: [phone]     Social Determinants of Health (SDOH)  Interventions    Readmission Risk Interventions No flowsheet data found.

## 2019-11-26 NOTE — ED Notes (Signed)
Pt returned from [facility]  Urine to lab   Pt reports still short of breath but better  She continues to smoke, but says not in several days   Resp called for Duoneb tx

## 2019-11-26 NOTE — ED Notes (Signed)
Pt education  Smoking with loss of cilia to mobilize secretions from lung  With stopping smoking regeneration of cilia to move  And the opportunity to have lungs decreased the amount of debris by moving it out of lungs  Pt reports that she is trying to stop smoking and that the OTC have not worked as one gives her a rash, another has a taste she cannot tolerate

## 2019-11-26 NOTE — ED Triage Notes (Signed)
Pt here for cough and SOB x 2 months since having first covid vaccine; cough noted and O2 87% on RA; pt placed on 2L [place]

## 2019-11-26 NOTE — Telephone Encounter (Signed)
Patient called to update on oxygen and medications. She stated that the medications are not there at [facility] checked with [facility], they are not there, checked with [employer] the medications are there and ready for pick up. Still awaiting oxygen authorizations

## 2019-11-26 NOTE — ED Notes (Signed)
Pts sats stayed at 94%-96% while ambulating with 2L.  While ambulating with no o2 her sats dropped to 88, she became SOB and stated her chest pain had returned, it took 5-6 mins for her oxygen to return to 96% on 2L. After 10 mins her sats are back to 98-100

## 2019-11-26 NOTE — ED Notes (Signed)
Per provider pt to go to ED for further eval; offered EMS and pt refused

## 2019-11-26 NOTE — ED Provider Notes (Signed)
[facility] EMERGENCY DEPARTMENT Provider Note   CSN: [id number] Arrival date & time: 11/26/19  [id number]     History Chief Complaint  Patient presents with  . Shortness of Breath    [name] is a 64 y.o. female with PMHx COPD who presents to the ED today after presenting to urgent care with noted O2 sat at 88% on RA.  Patient reports that she has been having shortness of breath with a dry cough as well as intermittent diffuse chest tightness since February when she received her first [place] Covid vaccine.  She states that she has not gotten the second dose and she will not due to her starting to have the symptoms.  She has been seen multiple times by her PCP Dr. Gerarda Fraction who is placed her on multiple rounds of antibiotics without improvement.  She states that the last time she was on antibiotic was in late March.  States her symptoms have been worsening for the past 2 weeks and she tried to make an appointment with her PCP however they have been busy and unable to get her in.  She went to urgent care today where she was noted to be hypoxic and told to come to the ED for further evaluation.  On arrival to the ED walked from the lobby into her room and was noted to have an O2 sat of 77% was placed on 4 L O2.  Patient is not normally on oxygen at home.  She states that she has been using her Minerva Fester every day as indicated as well as using her albuterol inhaler every 4 hours as indicated.  He has also been taking over-the-counter cough medication including Robitussin and Mucinex without relief.  She denies any recent sick contacts.  She denies any history of DVT/PE.  No recent prolonged travel or immobilization.  She does use estrogen vaginal cream.  No active malignancy.  No hemoptysis.  She does report her mother had a heart attack in her 49s or 42s.  She has noted to be a current every day smoker.  Denies fevers, chills, lesion to arms or jaw, unilateral leg swelling, palpitations, diaphoresis, any  other associated symptoms.   The history is provided by the patient and medical records.       Past Medical History:  Diagnosis Date  . COPD (chronic obstructive pulmonary disease) ([place])   . Diverticulosis   . Hypercholesteremia   . Pneumonia 12/2018   double pneumonia  . PONV (postoperative nausea and vomiting)   . Seasonal allergies   . Shingles   . Varicose veins     Patient Active Problem List   Diagnosis Date Noted  . Chronic cystitis with hematuria 10/12/2019  . OAB (overactive bladder) 10/12/2019  . Special screening for malignant neoplasms, colon 10/03/2016  . Varicose veins of left lower extremity with complications [id number]    Past Surgical History:  Procedure Laterality Date  . ABDOMINAL HYSTERECTOMY     partial  . BACK SURGERY     cervical surgery  . COLONOSCOPY N/A 02/04/2017   Procedure: COLONOSCOPY;  Surgeon: Rogene Houston, MD;  [facility];  Service: Endoscopy;  Laterality: N/A;  930  . ENDOSCOPIC CONCHA BULLOSA RESECTION Bilateral 03/21/2019   Procedure: ENDOSCOPIC CONCHA BULLOSA RESECTION;  Surgeon: Leta Baptist, MD;  Location: [place];  Service: ENT;  Laterality: Bilateral;  . NASAL SEPTOPLASTY W/ TURBINOPLASTY Bilateral 03/21/2019   Procedure: NASAL SEPTOPLASTY WITH BILATERAL TURBINATE REDUCTION;  Surgeon: Benjamine Mola,  Su, MD;  Location: [place];  Service: ENT;  Laterality: Bilateral;  . TONSILLECTOMY    . WRIST SURGERY     age 66yrs     OB History   No obstetric history on file.     Family History  Problem Relation Age of Onset  . Cancer Mother        breast  . Heart attack Mother   . Colon cancer Neg Hx     Social History   Tobacco Use  . Smoking status: Current Every Day Smoker    Packs/day: 0.50    Years: 45.00    Pack years: 22.50    Types: Cigarettes  . Smokeless tobacco: Never Used  Substance Use Topics  . Alcohol use: Yes    Alcohol/week: 0.0 standard drinks    Comment: occ  . Drug  use: No    Home Medications Prior to Admission medications   Medication Sig Start Date End Date Taking? Authorizing Provider  aspirin 81 MG EC tablet Take 81 mg by mouth every other day.   Yes [provider]  cetirizine (ZYRTEC) 10 MG tablet Take 10 mg by mouth daily.   Yes [provider]  Chlorpheniramine-DM (ROBITUSSIN NIGHTTIME COUGH PO) Take 20 mLs by mouth at bedtime.   Yes [provider]  Cholecalciferol (VITAMIN D3) 25 MCG (1000 UT) CAPS Take 1 capsule by mouth daily.   Yes [provider]  conjugated estrogens (PREMARIN) vaginal cream Place 1 Applicatorful vaginally every other day. 10/12/19  Yes McKenzie, [name], MD  Cranberry 1000 MG CAPS Take 1 capsule by mouth daily.   Yes [provider]  dextromethorphan-guaiFENesin (MUCINEX DM) 30-600 MG 12hr tablet Take 1 tablet by mouth 2 (two) times daily.   Yes [provider]  estradiol (ESTRACE) 0.5 MG tablet Take 0.5 mg by mouth daily.   Yes [provider]  Garlic [id number] MG CAPS Take 1 capsule by mouth daily.   Yes [provider]  levalbuterol (XOPENEX HFA) 45 MCG/ACT inhaler Inhale 2 puffs into the lungs every 4 (four) hours as needed for wheezing.   Yes [provider]  Multiple Vitamins-Minerals (HAIR SKIN AND NAILS FORMULA) TABS Take 2 tablets by mouth daily.   Yes [provider]  Multiple Vitamins-Minerals (MULTIVITAMIN ADULTS PO) Take 1 tablet by mouth daily.   Yes [provider]  Omega-3 Fatty Acids (FISH OIL) 1000 MG CAPS Take 1 capsule by mouth daily.   Yes [provider]  Tiotropium Bromide Monohydrate (SPIRIVA RESPIMAT) 1.25 MCG/ACT AERS Inhale into the lungs.   Yes [provider]  Tiotropium Bromide-Olodaterol (STIOLTO RESPIMAT) 2.5-2.5 MCG/ACT AERS Inhale 1 puff into the lungs every 12 (twelve) hours.   Yes [provider]  B Complex-C (SUPER B COMPLEX PO) Take 1 tablet by mouth daily.     [provider]  ipratropium-albuterol (DUONEB) 0.5-2.5 (3) MG/3ML SOLN Take 3 mLs by nebulization every 6 (six) hours as needed for up to 10 doses. 11/26/19   Alroy Bailiff, , PA-C  predniSONE (DELTASONE) 50 MG tablet Take 1 tablet (50 mg total) by mouth daily. 11/26/19   Eustaquio Maize, PA-C    Allergies    Bee venom, Codeine, and Other  Review of Systems   Review of Systems  Constitutional: Negative for chills and fever.  Respiratory: Positive for cough and shortness of breath.   Cardiovascular: Positive for chest pain. Negative for palpitations and leg swelling.  Gastrointestinal: Negative for abdominal pain, nausea  and vomiting.  All other systems reviewed and are negative.   Physical Exam Updated Vital Signs BP (!) 158/97 (BP Location: Right Arm)   Pulse 66   Resp (!) 22   Ht 5\' 7"  (1.702 m)   Wt 80.7 kg   SpO2 94%   BMI 27.88 kg/m   Physical Exam Vitals and nursing note reviewed.  Constitutional:      Appearance: She is not ill-appearing or diaphoretic.  HENT:     Head: Normocephalic and atraumatic.  Eyes:     Conjunctiva/sclera: Conjunctivae normal.  Cardiovascular:     Rate and Rhythm: Normal rate and regular rhythm.     Pulses: Normal pulses.  Pulmonary:     Effort: Pulmonary effort is normal.     Breath sounds: Wheezing present.     Comments: Diffuse end inspiratory wheezes. Pt on 4L  currently with O2 at 94%. Able to speak in full sentences without difficulty. No accessory muscle use.  Chest:     Chest wall: No tenderness.  Abdominal:     Palpations: Abdomen is soft.     Tenderness: There is no abdominal tenderness. There is no guarding or rebound.  Musculoskeletal:     Cervical back: Neck supple.     Right lower leg: No edema.     Left lower leg: No edema.  Skin:    General: Skin is warm and dry.  Neurological:     Mental Status: She is alert.     ED Results / Procedures / Treatments   Labs (all labs ordered are listed, but only  abnormal results are displayed) Labs Reviewed  CBC WITH DIFFERENTIAL/PLATELET - Abnormal; Notable for the following components:      Result Value   Eosinophils Absolute 0.7 (*)    All other components within normal limits  BLOOD GAS, ARTERIAL - Abnormal; Notable for the following components:   Allens test (pass/fail) LEFT BRACHIAL (*)    All other components within normal limits  RESPIRATORY PANEL BY RT PCR (FLU A&B, COVID)  COMPREHENSIVE METABOLIC PANEL  D-DIMER, QUANTITATIVE (NOT AT [facility])  URINALYSIS, ROUTINE W REFLEX MICROSCOPIC  BRAIN NATRIURETIC PEPTIDE  TROPONIN I (HIGH SENSITIVITY)  TROPONIN I (HIGH SENSITIVITY)    EKG None  Radiology DG Chest 2 View  Result Date: 11/26/2019 CLINICAL DATA:  Chest pain, shortness of breath, and wheezing. COPD. Smoker. EXAM: CHEST - 2 VIEW COMPARISON:  None. FINDINGS: The heart size and mediastinal contours are within normal limits. Both lungs are clear. Cervical spine fusion hardware again noted. IMPRESSION: No active cardiopulmonary disease. Electronically Signed   By: Marlaine Hind M.D.   On: 11/26/2019 10:31    Procedures Procedures (including critical care time)  Medications Ordered in ED Medications  methylPREDNISolone sodium succinate (SOLU-MEDROL) 125 mg/2 mL injection 125 mg (125 mg Intravenous Given 11/26/19 1029)  ipratropium-albuterol (DUONEB) 0.5-2.5 (3) MG/3ML nebulizer solution 3 mL (3 mLs Nebulization Given 11/26/19 1203)    ED Course  I have reviewed the triage vital signs and the nursing notes.  Pertinent labs & imaging results that were available during my care of the patient were reviewed by me and considered in my medical decision making (see chart for details).  Clinical Course as of Nov 25 1416  Sat Nov 26, 2019  1033 Clear  DG Chest 2 View [MV]  1050 D-Dimer, Quant: 0.42 [MV]  1248 90-93%.    [MV]    Clinical Course User Index [MV] Eustaquio Maize, PA-C   MDM Rules/Calculators/A&P  64 year old female presents to the ED today noted to be hypoxic at urgent care visit today for shortness of breath, cough, chest pain x2 months.  On arrival to the ED patient ambulated from the lobby to her room and was noted to have an O2 sat of 77% on room air and placed on 4 L O2.  History of COPD.  On exam patient has diffuse end inspiratory wheezes.  She uses Spiriva and albuterol inhaler at home.  She has received 1 dose of her [place] vaccine in February however reports that her symptoms started after this and she has not gotten the second dose and will not.  Even her hypoxia she will be admitted to the hospital today.  Will work-up for her shortness of breath.  Given she is having chest pain as well as hypoxia will obtain D-dimer at this time.  Swab for Covid.  Will give Solu-Medrol and reevaluate.  Once Covid test returned patient may benefit from duo nebs.  ABG without acute findings CBC without leukocytosis. Hgb 13.2 CMP without electrolyte abnormalities D dimer neg at 0.42 BNP 19 Troponin of 3  U/A without infection CXR clear  COVID test negative  On reevaluation pt reports mild improvement in sx. She continues to have wheezing however. Given covid negative will order breathing treatment at this time. Pt's O2 decreased to 2L with O2 sats remaining in the 90s.   Reeval after breathing tx - pt appears improved overall although still continues to have some wheezing. Weaned pt off of O2 and had her lay in the bed with O2 sats sitting around 91%. Then had patient stand up next to bed and O2 sat quickly dropped to 87% on RA; pt placed back on 2L. Will consult hospitalist for admission.   Discussed case with [facility] Dr. Manuella Ghazi who spoke with Alma Friendly at [facility] who can set up for oxygen at home today to keep patient out of hospital with only need for O2 given rest of work up very reassuring. I spoke with Colletta Maryland myself who needs pt ambulated in the ED without O2 with  documented sats and then bumped up to where her O2 would stay at 90-93% to determine how much O2 she requires at home. Will have tech do this.   Pt ambulated:  Pts sats stayed at 94%-96% while ambulating with 2L.  While ambulating with no o2 her sats dropped to 88, she became SOB and stated her chest pain had returned, it took 5-6 mins for her oxygen to return to 96% on 2L. After 10 mins her sats are back to 98-100  Oxygen has been ordered per DME order. Alma Friendly RN is currently working on getting it set up for patient today. Pt recommended to have someone pick her up for the ED and go home and rest until she gets the O2 delivered. She is instructed to call 9-1-1 immediately for any worsening symptoms including worsening SOB prior to O2 being delivered. Pt is in agreement with plan and stable for discharge home.   This note was prepared using Dragon voice recognition software and may include unintentional dictation errors due to the inherent limitations of voice recognition software.  Final Clinical Impression(s) / ED Diagnoses Final diagnoses:  COPD exacerbation ([place])  Hypoxia    Rx / DC Orders ED Discharge Orders         Ordered    For home use only DME oxygen     11/26/19 1410    predniSONE (  DELTASONE) 50 MG tablet  Daily     11/26/19 1411    For home use only DME Nebulizer machine     11/26/19 1412    ipratropium-albuterol (DUONEB) 0.5-2.5 (3) MG/3ML SOLN  Every 6 hours PRN     11/26/19 1413           Discharge Instructions     Please pick up medications and use as prescribed. An order has been placed for both a nebulizer machine as well as home oxygen. The oxygen should be delivered to you today.   Follow up with Dr. Gerarda Fraction regarding your ED visit today. He can reassess you and determine need for longterm O2 if needed  While at home awaiting oxygen please avoid excessive activity as your oxygen saturation level will drop causing you to feel more short of breath. It  is recommended that you relax on the couch or bed until the oxygen can be delivered to you  Return to the ED IMMEDIATELY for any worsening symptoms including worsening SOB before oxygen is delivered - you should call 9-1-1 immediately at that point. Return for worsening SOB while on oxygen, chest pain, dizziness/lightheadedness, fever, using your nebulizer more than every 4 hours, confusion, or any other new or concerning symptoms       Eustaquio Maize, PA-C 11/26/19 1418    Milton Ferguson, MD 11/27/19 [phone]

## 2019-11-26 NOTE — Discharge Instructions (Addendum)
Please pick up medications and use as prescribed. An order has been placed for both a nebulizer machine as well as home oxygen. The oxygen should be delivered to you today.   Follow up with Dr. Gerarda Fraction regarding your ED visit today. He can reassess you and determine need for longterm O2 if needed  While at home awaiting oxygen please avoid excessive activity as your oxygen saturation level will drop causing you to feel more short of breath. It is recommended that you relax on the couch or bed until the oxygen can be delivered to you  Return to the ED IMMEDIATELY for any worsening symptoms including worsening SOB before oxygen is delivered - you should call 9-1-1 immediately at that point. Return for worsening SOB while on oxygen, chest pain, dizziness/lightheadedness, fever, using your nebulizer more than every 4 hours, confusion, or any other new or concerning symptoms

## 2019-11-26 NOTE — Progress Notes (Signed)
Originally contacted [employer] for oxygen delivery, [employer] does not go to [employer]. However representative called back at 1525 stating they do not accept patients insurance. Called [employer], who already had a account for patient, I will fax over notes and paperwork and they will get a representative over with the oxygen.

## 2019-11-26 NOTE — ED Notes (Signed)
Pt has a purewic and call light is at bedside

## 2019-11-26 NOTE — Progress Notes (Signed)
[employer] called patient is approved and oxygen will be delivered they are calling patient with time of delivery tonight.

## 2019-11-26 NOTE — ED Triage Notes (Signed)
Pt reports sob and cough since receiving her first [place] covid vaccine in feb.  Reports has been on multiple antibiotics without relief.  Reports sob worse today.  Pt went to urgent care and was told to come here.  Reports o2 sat at urgent care was in the 80's.

## 2019-11-26 NOTE — Progress Notes (Signed)
[employer] reviewing all paerwork sent over notes, order, and walking oxygenation on room air and with oxygen for consideration for home o2

## 2019-11-28 DIAGNOSIS — J449 Chronic obstructive pulmonary disease, unspecified: Secondary | ICD-10-CM | POA: Diagnosis not present

## 2019-11-29 DIAGNOSIS — J441 Chronic obstructive pulmonary disease with (acute) exacerbation: Secondary | ICD-10-CM | POA: Diagnosis not present

## 2019-11-29 DIAGNOSIS — Z6830 Body mass index (BMI) 30.0-30.9, adult: Secondary | ICD-10-CM | POA: Diagnosis not present

## 2019-11-29 DIAGNOSIS — E6609 Other obesity due to excess calories: Secondary | ICD-10-CM | POA: Diagnosis not present

## 2019-12-06 DIAGNOSIS — J449 Chronic obstructive pulmonary disease, unspecified: Secondary | ICD-10-CM | POA: Diagnosis not present

## 2019-12-07 ENCOUNTER — Encounter: Payer: Self-pay | Admitting: Internal Medicine

## 2019-12-07 ENCOUNTER — Other Ambulatory Visit: Payer: Self-pay

## 2019-12-07 ENCOUNTER — Ambulatory Visit: Payer: PPO | Admitting: Internal Medicine

## 2019-12-07 VITALS — BP 132/72 | HR 69 | Temp 97.7°F | Ht 66.0 in | Wt 185.2 lb

## 2019-12-07 DIAGNOSIS — Z720 Tobacco use: Secondary | ICD-10-CM

## 2019-12-07 DIAGNOSIS — J4550 Severe persistent asthma, uncomplicated: Secondary | ICD-10-CM

## 2019-12-07 MED ORDER — FAMOTIDINE 20 MG PO TABS: 20.0000 mg | ORAL_TABLET | Freq: Every day | ORAL | 3 refills | Status: DC

## 2019-12-07 MED ORDER — BREO ELLIPTA 200-25 MCG/INH IN AEPB: 1.0000 | INHALATION_SPRAY | Freq: Every day | RESPIRATORY_TRACT | 11 refills | Status: DC

## 2019-12-07 MED ORDER — CETIRIZINE HCL 10 MG PO TABS: 10.0000 mg | ORAL_TABLET | Freq: Every day | ORAL | 3 refills | Status: AC

## 2019-12-07 NOTE — Progress Notes (Signed)
Synopsis: Referred for DOE by [employer], MD  Assessment & Plan:  Problem 1 Likely asthma: could be some COPD overlap but history more c/w asthma.  Recent flare with timing that correlated with receiving Moderna COVID vaccine, either she had an allergic response or came into a trigger in the environment in which she received the med. Problem 2 Ongoing smoking: does not help asthma control, fits criteria for LDCT testing. Problem 3 Oxygen dependence: she walked around the office on room air today and saturations stayed above 96%.  She denies any dyspnea, I wonder if her nail polish interfered with saturation probes in ER.  - Stop stiolto and duonebs - Start breo - Okay to stop supplemental O2 - Okay to continue low dose spiriva - Start zyrtec, pepcid - Check IgE, Eos, suspect these will be high - Lung cancer screening referral - Lung function testing  MDM . I reviewed prior external note(s) from Dr. Roderic Palau on 11/26/19 . I reviewed the result(s) of eosinophil count from 11/26/19: 700 (500 back in 2017) . I have ordered meds and labs listed above  Review of patient's CXR images on 5/8 revealed normal lungs. The patient's images have been independently reviewed by me.     End of visit medications:  Current Outpatient Medications:  .  aspirin 81 MG EC tablet, Take 81 mg by mouth every other day., Disp: , Rfl:  .  B Complex-C (SUPER B COMPLEX PO), Take 1 tablet by mouth daily., Disp: , Rfl:  .  Chlorpheniramine-DM (ROBITUSSIN NIGHTTIME COUGH PO), Take 20 mLs by mouth at bedtime., Disp: , Rfl:  .  Cholecalciferol (VITAMIN D3) 25 MCG (1000 UT) CAPS, Take 1 capsule by mouth daily., Disp: , Rfl:  .  conjugated estrogens (PREMARIN) vaginal cream, Place 1 Applicatorful vaginally every other day., Disp: 42.5 g, Rfl: 5 .  Cranberry 1000 MG CAPS, Take 1 capsule by mouth daily., Disp: , Rfl:  .  dextromethorphan-guaiFENesin (MUCINEX DM) 30-600 MG 12hr tablet, Take 1 tablet by mouth 2 (two)  times daily., Disp: , Rfl:  .  estradiol (ESTRACE) 0.5 MG tablet, Take 0.5 mg by mouth daily., Disp: , Rfl:  .  Garlic 123XX123 MG CAPS, Take 1 capsule by mouth daily., Disp: , Rfl:  .  levalbuterol (XOPENEX HFA) 45 MCG/ACT inhaler, Inhale 2 puffs into the lungs every 4 (four) hours as needed for wheezing., Disp: , Rfl:  .  Multiple Vitamins-Minerals (HAIR SKIN AND NAILS FORMULA) TABS, Take 2 tablets by mouth daily., Disp: , Rfl:  .  Multiple Vitamins-Minerals (MULTIVITAMIN ADULTS PO), Take 1 tablet by mouth daily., Disp: , Rfl:  .  Omega-3 Fatty Acids (FISH OIL) 1000 MG CAPS, Take 1 capsule by mouth daily., Disp: , Rfl:  .  cetirizine (ZYRTEC ALLERGY) 10 MG tablet, Take 1 tablet (10 mg total) by mouth daily., Disp: 90 tablet, Rfl: 3 .  famotidine (PEPCID) 20 MG tablet, Take 1 tablet (20 mg total) by mouth at bedtime., Disp: 90 tablet, Rfl: 3 .  fluticasone furoate-vilanterol (BREO ELLIPTA) 200-25 MCG/INH AEPB, Inhale 1 puff into the lungs daily., Disp: 30 each, Rfl: 11 .  Tiotropium Bromide Monohydrate (SPIRIVA RESPIMAT) 1.25 MCG/ACT AERS, Inhale into the lungs., Disp: , Rfl:    [name], MD [facility] 12/07/2019 4:29 PM    Subjective:   PATIENT ID: [name] GENDER: female DOB: October 10, 1955, MRN: [id number]  Chief Complaint  Patient presents with  . Consult    O2 since  ER visit on 11/26/19    HPI Here after ER visit on 5/8. Had wheezing, DOE after receiving Moderna COVID vaccine. Longstanding history of DOE, wheezing, seasonal allergies, sinus issues. Sent home from ER on oxygen and steroids. Feels better, does not want oxygen anymore. 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  25 pack year history.  Ancillary information including prior medications, full medical/surgical/family/social histoies, and PFTs (when available) are listed below and have been reviewed.   ROS + symptoms in bold Fevers, chills, weight loss Nausea, vomiting, diarrhea Shortness of breath, wheezing, cough Chest pain, palpitations, lower ext edema   Objective:   Vitals:   12/07/19 1426  BP: 132/72  Pulse: 69  Temp: 97.7 F (36.5 C)  TempSrc: Temporal  SpO2: 99%  Weight: 185 lb 3.2 oz (84 kg)  Height: 5\' 6"  (1.676 m)   99% on RA BMI Readings from Last 3 Encounters:  12/07/19 29.89 kg/m  11/26/19 27.88 kg/m  10/12/19 29.21 kg/m   Wt Readings from Last 3 Encounters:  12/07/19 185 lb 3.2 oz (84 kg)  11/26/19 178 lb (80.7 kg)  10/12/19 181 lb (82.1 kg)    GEN: pleasant woman in no acute distress HEENT: trachea midline, mucus membranes moist CV: Regular rate and rhythm, extremities are warm PULM: Wheezing bilaterally without accessory muscle use GI: Soft, +BS EXT: No edema or clubbing NEURO: Moves all 4 extremities   Ancillary Information    Past Medical History:  Diagnosis Date  . COPD (chronic obstructive pulmonary disease) ([place])   . Diverticulosis   . Hypercholesteremia   . Pneumonia 12/2018   double pneumonia  . PONV (postoperative nausea and vomiting)   . Seasonal allergies   . Shingles   . Varicose veins      Family History  Problem Relation Age of Onset  . Cancer Mother        breast  . Heart attack Mother   . Colon cancer Neg Hx      Past Surgical History:  Procedure Laterality Date  . ABDOMINAL HYSTERECTOMY     partial  . BACK SURGERY     cervical surgery  . COLONOSCOPY N/A 02/04/2017   Procedure: COLONOSCOPY;  Surgeon: Rogene Houston, MD;  [facility];  Service: Endoscopy;  Laterality: N/A;  930  . ENDOSCOPIC CONCHA BULLOSA RESECTION Bilateral 03/21/2019   Procedure: ENDOSCOPIC CONCHA BULLOSA RESECTION;  Surgeon:  Leta Baptist, MD;  Location: [facility];  Service: ENT;  Laterality: Bilateral;  . NASAL SEPTOPLASTY W/ TURBINOPLASTY Bilateral 03/21/2019   Procedure: NASAL SEPTOPLASTY WITH BILATERAL TURBINATE REDUCTION;  Surgeon: Leta Baptist, MD;  Location: [facility];  Service: ENT;  Laterality: Bilateral;  . TONSILLECTOMY    . WRIST SURGERY     age 18yrs    Social History   Socioeconomic History  . Marital status: Widowed    Spouse name: Not on file  . Number of children: Not on file  . Years of education: Not on file  . Highest education level: Not on file  Occupational History  . Not on file  Tobacco Use  . Smoking status: Former Smoker    Packs/day: 0.50    Years:  45.00    Pack years: 22.50    Types: Cigarettes    Quit date: 11/23/2019    Years since quitting: 0.0  . Smokeless tobacco: Never Used  . Tobacco comment: quit smoking on 11/23/19   Substance and Sexual Activity  . Alcohol use: Yes    Alcohol/week: 0.0 standard drinks    Comment: occ  . Drug use: No  . Sexual activity: Not Currently    Birth control/protection: Post-menopausal  Other Topics Concern  . Not on file  Social History Narrative  . Not on file   Social Determinants of Health   Financial Resource Strain:   . Difficulty of Paying Living Expenses:   Food Insecurity:   . Worried About Charity fundraiser in the Last Year:   . Arboriculturist in the Last Year:   Transportation Needs:   . Film/video editor (Medical):   Marland Kitchen Lack of Transportation (Non-Medical):   Physical Activity:   . Days of Exercise per Week:   . Minutes of Exercise per Session:   Stress:   . Feeling of Stress :   Social Connections:   . Frequency of Communication with Friends and Family:   . Frequency of Social Gatherings with Friends and Family:   . Attends Religious Services:   . Active Member of Clubs or Organizations:   . Attends Archivist Meetings:   Marland Kitchen Marital Status:   Intimate Partner  Violence:   . Fear of Current or Ex-Partner:   . Emotionally Abused:   Marland Kitchen Physically Abused:   . Sexually Abused:      Allergies  Allergen Reactions  . Bee Venom Anaphylaxis  . Codeine Nausea And Vomiting  . Other Nausea And Vomiting    General anesthesia      CBC    Component Value Date/Time   WBC 8.3 11/26/2019 1024   RBC 4.21 11/26/2019 1024   HGB 13.2 11/26/2019 1024   HCT 40.4 11/26/2019 1024   PLT 277 11/26/2019 1024   MCV 96.0 11/26/2019 1024   MCH 31.4 11/26/2019 1024   MCHC 32.7 11/26/2019 1024   RDW 12.3 11/26/2019 1024   LYMPHSABS 3.0 11/26/2019 1024   MONOABS 0.6 11/26/2019 1024   EOSABS 0.7 (H) 11/26/2019 1024   BASOSABS 0.1 11/26/2019 1024    Pulmonary Functions Testing Results: No flowsheet data found.  Outpatient Medications Prior to Visit  Medication Sig Dispense Refill  . aspirin 81 MG EC tablet Take 81 mg by mouth every other day.    . B Complex-C (SUPER B COMPLEX PO) Take 1 tablet by mouth daily.    . Chlorpheniramine-DM (ROBITUSSIN NIGHTTIME COUGH PO) Take 20 mLs by mouth at bedtime.    . Cholecalciferol (VITAMIN D3) 25 MCG (1000 UT) CAPS Take 1 capsule by mouth daily.    Marland Kitchen conjugated estrogens (PREMARIN) vaginal cream Place 1 Applicatorful vaginally every other day. 42.5 g 5  . Cranberry 1000 MG CAPS Take 1 capsule by mouth daily.    Marland Kitchen dextromethorphan-guaiFENesin (MUCINEX DM) 30-600 MG 12hr tablet Take 1 tablet by mouth 2 (two) times daily.    Marland Kitchen estradiol (ESTRACE) 0.5 MG tablet Take 0.5 mg by mouth daily.    . Garlic 123XX123 MG CAPS Take 1 capsule by mouth daily.    Marland Kitchen levalbuterol (XOPENEX HFA) 45 MCG/ACT inhaler Inhale 2 puffs into the lungs every 4 (four) hours as needed for wheezing.    . Multiple Vitamins-Minerals (HAIR SKIN AND NAILS FORMULA) TABS Take  2 tablets by mouth daily.    . Multiple Vitamins-Minerals (MULTIVITAMIN ADULTS PO) Take 1 tablet by mouth daily.    . Omega-3 Fatty Acids (FISH OIL) 1000 MG CAPS Take 1 capsule by mouth daily.     . cetirizine (ZYRTEC) 10 MG tablet Take 10 mg by mouth daily.    Marland Kitchen ipratropium-albuterol (DUONEB) 0.5-2.5 (3) MG/3ML SOLN Take 3 mLs by nebulization every 6 (six) hours as needed for up to 10 doses. 30 mL 0  . Tiotropium Bromide Monohydrate (SPIRIVA RESPIMAT) 1.25 MCG/ACT AERS Inhale into the lungs.    . predniSONE (DELTASONE) 50 MG tablet Take 1 tablet (50 mg total) by mouth daily. (Patient not taking: Reported on 12/07/2019) 5 tablet 0  . Tiotropium Bromide-Olodaterol (STIOLTO RESPIMAT) 2.5-2.5 MCG/ACT AERS Inhale 1 puff into the lungs every 12 (twelve) hours.     No facility-administered medications prior to visit.

## 2019-12-07 NOTE — Patient Instructions (Addendum)
-   Stop stiolto and duonebs - Continue spiriva (tiotripium) respimat once daily - Start breo once daily, rinse out mouth after use - Restart zyrtec, I have sent script for this to your pharmacy - Start pepcid - Lung cancer screening referral - Stop smoking - Lung function tests  - Come back in 6 weeks to see either me or one of our nurse practitioners to review labs and breathing

## 2019-12-12 ENCOUNTER — Other Ambulatory Visit (HOSPITAL_COMMUNITY): Payer: Self-pay | Admitting: Internal Medicine

## 2019-12-12 ENCOUNTER — Other Ambulatory Visit: Payer: Self-pay | Admitting: *Deleted

## 2019-12-12 ENCOUNTER — Encounter (HOSPITAL_COMMUNITY): Payer: Self-pay

## 2019-12-12 ENCOUNTER — Other Ambulatory Visit: Payer: Self-pay

## 2019-12-12 DIAGNOSIS — Z87891 Personal history of nicotine dependence: Secondary | ICD-10-CM

## 2019-12-12 DIAGNOSIS — Z1231 Encounter for screening mammogram for malignant neoplasm of breast: Secondary | ICD-10-CM

## 2019-12-13 ENCOUNTER — Encounter (HOSPITAL_COMMUNITY): Payer: Self-pay | Admitting: Hematology

## 2019-12-13 ENCOUNTER — Inpatient Hospital Stay (HOSPITAL_COMMUNITY): Payer: PPO

## 2019-12-13 ENCOUNTER — Inpatient Hospital Stay (HOSPITAL_COMMUNITY): Payer: PPO | Attending: Hematology | Admitting: Hematology

## 2019-12-13 VITALS — BP 108/61 | HR 73 | Temp 97.0°F | Resp 18 | Ht 66.0 in | Wt 188.9 lb

## 2019-12-13 DIAGNOSIS — Z87891 Personal history of nicotine dependence: Secondary | ICD-10-CM | POA: Insufficient documentation

## 2019-12-13 DIAGNOSIS — D72829 Elevated white blood cell count, unspecified: Secondary | ICD-10-CM | POA: Diagnosis not present

## 2019-12-13 LAB — CBC WITH DIFFERENTIAL/PLATELET
Abs Immature Granulocytes: 0.03 10*3/uL (ref 0.00–0.07)
Basophils Absolute: 0.1 10*3/uL (ref 0.0–0.1)
Basophils Relative: 1 %
Eosinophils Absolute: 0.7 10*3/uL — ABNORMAL HIGH (ref 0.0–0.5)
Eosinophils Relative: 7 %
HCT: 38 % (ref 36.0–46.0)
Hemoglobin: 12.4 g/dL (ref 12.0–15.0)
Immature Granulocytes: 0 %
Lymphocytes Relative: 38 %
Lymphs Abs: 3.6 10*3/uL (ref 0.7–4.0)
MCH: 31.2 pg (ref 26.0–34.0)
MCHC: 32.6 g/dL (ref 30.0–36.0)
MCV: 95.5 fL (ref 80.0–100.0)
Monocytes Absolute: 0.9 10*3/uL (ref 0.1–1.0)
Monocytes Relative: 10 %
Neutro Abs: 4.2 10*3/uL (ref 1.7–7.7)
Neutrophils Relative %: 44 %
Platelets: 218 10*3/uL (ref 150–400)
RBC: 3.98 MIL/uL (ref 3.87–5.11)
RDW: 12.1 % (ref 11.5–15.5)
WBC: 9.5 10*3/uL (ref 4.0–10.5)
nRBC: 0 % (ref 0.0–0.2)

## 2019-12-13 LAB — LACTATE DEHYDROGENASE: LDH: 172 U/L (ref 98–192)

## 2019-12-13 NOTE — Progress Notes (Signed)
CONSULT NOTE  Patient Care Team: Redmond School, MD as PCP - General (Internal Medicine) Derek Jack, MD as Consulting Physician (Hematology)  CHIEF COMPLAINTS/PURPOSE OF CONSULTATION:  Leukocytosis.  HISTORY OF PRESENTING ILLNESS:  [name] 64 y.o. female is seen at the request of Dr. Gerarda Fraction for further work-up and management of leukocytosis.  CBC on 10/24/2019 showed white count 14.5, hemoglobin 14.1 and platelet count 294.  Differential showed 64% neutrophils, 24% lymphocytes, 7% monocytes and 4% eosinophils and 1% basophil.  She denies any fevers, night sweats or weight loss.  She has been gaining weight since she quit smoking about 3 weeks ago.  She recently went to the ER on 11/26/2019 with breathing problem.  CBC at that time was normal white count of 8.3 with hemoglobin 13.1 platelet count 277.  Differential showed elevated absolute eosinophil count of 700.  She reported that she had severe infection to COVID-19 vaccine when she received it towards the end of February.  She reportedly had pneumonia x2 last year and thinks she might have had COVID-19 infection at that time.  Denies any prior history of splenectomy.  During her recent visit to the ER, she was prescribed prednisone for 5 days.  She has taken prednisone previously intermittently.  She is being followed by pulmonology and was thought to have asthma.  She is started on Breo inhaler and Pepcid.  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  She smoked half pack per day for 40 years and quit smoking 3 years ago.  Family history significant for paternal grandmother and paternal aunt with "bone cancer".  Maternal grandfather had leukemia.  Mother had breast cancer and died of leukemia.  Maternal aunt had metastatic cancer.  Another maternal aunt and maternal cousin died of metastatic breast cancer.  She did not have any mammogram done after 2019.  MEDICAL HISTORY:  Past Medical History:  Diagnosis  Date  . Asthma   . Diverticulosis   . Hypercholesteremia   . Pneumonia 12/2018   double pneumonia  . PONV (postoperative nausea and vomiting)   . Seasonal allergies   . Shingles   . Varicose veins     SURGICAL HISTORY: Past Surgical History:  Procedure Laterality Date  . ABDOMINAL HYSTERECTOMY     partial  . BACK SURGERY     cervical surgery  . COLONOSCOPY N/A 02/04/2017   Procedure: COLONOSCOPY;  Surgeon: Rogene Houston, MD;  [facility];  Service: Endoscopy;  Laterality: N/A;  930  . ENDOSCOPIC CONCHA BULLOSA RESECTION Bilateral 03/21/2019   Procedure: ENDOSCOPIC CONCHA BULLOSA RESECTION;  Surgeon: Leta Baptist, MD;  Location: [facility];  Service: ENT;  Laterality: Bilateral;  . NASAL SEPTOPLASTY W/ TURBINOPLASTY Bilateral 03/21/2019   Procedure: NASAL SEPTOPLASTY WITH BILATERAL TURBINATE REDUCTION;  Surgeon: Leta Baptist, MD;  Location: [facility];  Service: ENT;  Laterality: Bilateral;  . TONSILLECTOMY    . WRIST SURGERY     age 66yrs    SOCIAL HISTORY: Social History   Socioeconomic History  . Marital status: Widowed    Spouse name: Not on file  . Number of children: 0  . Years of education: Not on file  . Highest education level: Not on file  Occupational History  . Not on file  Tobacco Use  . Smoking status: Former Smoker    Packs/day: 0.50    Years: 45.00    Pack years: 22.50    Types: Cigarettes  Quit date: 11/23/2019    Years since quitting: 0.0  . Smokeless tobacco: Never Used  . Tobacco comment: quit smoking on 11/23/19   Substance and Sexual Activity  . Alcohol use: Yes    Alcohol/week: 0.0 standard drinks    Comment: occ  . Drug use: No  . Sexual activity: Not Currently    Birth control/protection: Post-menopausal  Other Topics Concern  . Not on file  Social History Narrative  . Not on file   Social Determinants of Health   Financial Resource Strain:   . Difficulty of Paying Living Expenses:   Food  Insecurity:   . Worried About Charity fundraiser in the Last Year:   . Arboriculturist in the Last Year:   Transportation Needs:   . Film/video editor (Medical):   Marland Kitchen Lack of Transportation (Non-Medical):   Physical Activity:   . Days of Exercise per Week:   . Minutes of Exercise per Session:   Stress:   . Feeling of Stress :   Social Connections:   . Frequency of Communication with Friends and Family:   . Frequency of Social Gatherings with Friends and Family:   . Attends Religious Services:   . Active Member of Clubs or Organizations:   . Attends Archivist Meetings:   Marland Kitchen Marital Status:   Intimate Partner Violence:   . Fear of Current or Ex-Partner:   . Emotionally Abused:   Marland Kitchen Physically Abused:   . Sexually Abused:     FAMILY HISTORY: Family History  Problem Relation Age of Onset  . Cancer Mother        breast  . Heart attack Mother   . Acute myelogenous leukemia Mother   . Gallbladder disease Father   . Diabetes Brother   . Thyroid cancer Maternal Aunt   . Brain cancer Maternal Aunt   . Bone cancer Paternal Aunt   . Diabetes Maternal Grandmother   . Dementia Maternal Grandmother   . Leukemia Maternal Grandfather   . Bone cancer Paternal Grandmother   . Breast cancer Maternal Aunt   . Lung cancer Maternal Aunt   . Skin cancer Brother   . Diabetes Brother   . Colon cancer Neg Hx     ALLERGIES:  is allergic to bee venom; codeine; and other.  MEDICATIONS:  Current Outpatient Medications  Medication Sig Dispense Refill  . aspirin 81 MG EC tablet Take 81 mg by mouth every other day.    . B Complex-C (SUPER B COMPLEX PO) Take 1 tablet by mouth daily.    . cetirizine (ZYRTEC ALLERGY) 10 MG tablet Take 1 tablet (10 mg total) by mouth daily. 90 tablet 3  . Cholecalciferol (VITAMIN D3) 25 MCG (1000 UT) CAPS Take 1 capsule by mouth daily.    Marland Kitchen conjugated estrogens (PREMARIN) vaginal cream Place 1 Applicatorful vaginally every other day. 42.5 g 5  .  Cranberry 1000 MG CAPS Take 1 capsule by mouth daily.    . famotidine (PEPCID) 20 MG tablet Take 1 tablet (20 mg total) by mouth at bedtime. 90 tablet 3  . fluticasone furoate-vilanterol (BREO ELLIPTA) 200-25 MCG/INH AEPB Inhale 1 puff into the lungs daily. 30 each 11  . Garlic 123XX123 MG CAPS Take 1 capsule by mouth daily.    . Multiple Vitamins-Minerals (HAIR SKIN AND NAILS FORMULA) TABS Take 2 tablets by mouth daily.    . Multiple Vitamins-Minerals (MULTIVITAMIN ADULTS PO) Take 1 tablet by mouth daily.    Marland Kitchen  Omega-3 Fatty Acids (FISH OIL) 1000 MG CAPS Take 1 capsule by mouth daily.     No current facility-administered medications for this visit.    REVIEW OF SYSTEMS:   Constitutional: Denies fevers, chills or abnormal night sweats Eyes: Denies blurriness of vision, double vision or watery eyes Ears, nose, mouth, throat, and face: Denies mucositis or sore throat Respiratory: Denies cough, dyspnea or wheezes Cardiovascular: Denies palpitation, chest discomfort or lower extremity swelling Gastrointestinal:  Denies nausea, heartburn or change in bowel habits Skin: Denies abnormal skin rashes Lymphatics: Denies new lymphadenopathy or easy bruising Neurological:Denies numbness, tingling or new weaknesses Behavioral/Psych: Mood is stable, no new changes  All other systems were reviewed with the patient and are negative.  PHYSICAL EXAMINATION: ECOG PERFORMANCE STATUS: 0 - Asymptomatic  Vitals:   12/13/19 1359  BP: 108/61  Pulse: 73  Resp: 18  Temp: (!) 97 F (36.1 C)  SpO2: 98%   Filed Weights   12/13/19 1359  Weight: 188 lb 14.4 oz (85.7 kg)    GENERAL:alert, no distress and comfortable SKIN: skin color, texture, turgor are normal, no rashes or significant lesions EYES: normal, conjunctiva are pink and non-injected, sclera clear OROPHARYNX:no exudate, no erythema and lips, buccal mucosa, and tongue normal  NECK: supple, thyroid normal size, non-tender, without nodularity LYMPH:   no palpable lymphadenopathy in the cervical, axillary or inguinal LUNGS: clear to auscultation and percussion with normal breathing effort HEART: regular rate & rhythm and no murmurs and no lower extremity edema ABDOMEN:abdomen soft, non-tender and normal bowel sounds Musculoskeletal:no cyanosis of digits and no clubbing  PSYCH: alert & oriented x 3 with fluent speech NEURO: no focal motor/sensory deficits  LABORATORY DATA:  I have reviewed the data as listed Recent Results (from the past 2160 hour(s))  Comprehensive metabolic panel     Status: None   Collection Time: 11/26/19 10:24 AM  Result Value Ref Range   Sodium 138 135 - 145 mmol/L   Potassium 4.4 3.5 - 5.1 mmol/L   Chloride 103 98 - 111 mmol/L   CO2 28 22 - 32 mmol/L   Glucose, Bld 94 70 - 99 mg/dL    Comment: Glucose reference range applies only to samples taken after fasting for at least 8 hours.   BUN 18 8 - 23 mg/dL   Creatinine, Ser 0.70 0.44 - 1.00 mg/dL   Calcium 8.9 8.9 - 10.3 mg/dL   Total Protein 7.0 6.5 - 8.1 g/dL   Albumin 4.0 3.5 - 5.0 g/dL   AST 25 15 - 41 U/L   ALT 21 0 - 44 U/L   Alkaline Phosphatase 77 38 - 126 U/L   Total Bilirubin 0.7 0.3 - 1.2 mg/dL   GFR calc non Af Amer >60 >60 mL/min   GFR calc Af Amer >60 >60 mL/min   Anion gap 7 5 - 15    Comment: Performed at [facility], [address], [place], [place] [address]  Troponin I (High Sensitivity)     Status: None   Collection Time: 11/26/19 10:24 AM  Result Value Ref Range   Troponin I (High Sensitivity) 3 <18 ng/L    Comment: (NOTE) Elevated high sensitivity troponin I (hsTnI) values and significant  changes across serial measurements may suggest ACS but many other  chronic and acute conditions are known to elevate hsTnI results.  Refer to the "Links" section for chest pain algorithms and additional  guidance. Performed at [facility], [address], [place], [place] [address]  D-dimer, quantitative     Status: None   Collection Time:  11/26/19 10:24 AM  Result Value Ref Range   D-Dimer, Quant 0.42 0.00 - 0.50 ug/mL-FEU    Comment: (NOTE) At the manufacturer cut-off of 0.50 ug/mL FEU, this assay has been documented to exclude PE with a sensitivity and negative predictive value of 97 to 99%.  At this time, this assay has not been approved by the FDA to exclude DVT/VTE. Results should be correlated with clinical presentation. Performed at [facility], [address], [place], [place] [address]   CBC with Differential     Status: Abnormal   Collection Time: 11/26/19 10:24 AM  Result Value Ref Range   WBC 8.3 4.0 - 10.5 K/uL   RBC 4.21 3.87 - 5.11 MIL/uL   Hemoglobin 13.2 12.0 - 15.0 g/dL   HCT 40.4 36.0 - 46.0 %   MCV 96.0 80.0 - 100.0 fL   MCH 31.4 26.0 - 34.0 pg   MCHC 32.7 30.0 - 36.0 g/dL   RDW 12.3 11.5 - 15.5 %   Platelets 277 150 - 400 K/uL   nRBC 0.0 0.0 - 0.2 %   Neutrophils Relative % 48 %   Neutro Abs 4.0 1.7 - 7.7 K/uL   Lymphocytes Relative 36 %   Lymphs Abs 3.0 0.7 - 4.0 K/uL   Monocytes Relative 7 %   Monocytes Absolute 0.6 0.1 - 1.0 K/uL   Eosinophils Relative 8 %   Eosinophils Absolute 0.7 (H) 0.0 - 0.5 K/uL   Basophils Relative 1 %   Basophils Absolute 0.1 0.0 - 0.1 K/uL   Immature Granulocytes 0 %   Abs Immature Granulocytes 0.02 0.00 - 0.07 K/uL    Comment: Performed at [facility], [address] [place], [place] [address]  Brain natriuretic peptide     Status: None   Collection Time: 11/26/19 10:24 AM  Result Value Ref Range   B Natriuretic Peptide 19.0 0.0 - 100.0 pg/mL    Comment: Performed at [facility], [address] [place], [place] [address]  Blood gas, arterial (at [facility])     Status: Abnormal   Collection Time: 11/26/19 10:24 AM  Result Value Ref Range   FIO2 36.00    pH, Arterial 7.423 7.350 - 7.450   pCO2 arterial 40.0 32.0 - 48.0 mmHg   pO2, Arterial 89.9 83.0 - 108.0 mmHg   Bicarbonate 25.9 20.0 - 28.0 mmol/L   Acid-Base Excess 1.7 0.0 - 2.0 mmol/L   O2  Saturation 96.8 %   Patient temperature 36.7    Allens test (pass/fail) LEFT BRACHIAL (A) PASS    Comment: Performed at [facility], [address], [place], [place] [address]  Respiratory Panel by RT PCR (Flu A&B, Covid) - Nasopharyngeal Swab     Status: None   Collection Time: 11/26/19 10:39 AM   Specimen: Nasopharyngeal Swab  Result Value Ref Range   SARS Coronavirus 2 by RT PCR NEGATIVE NEGATIVE    Comment: (NOTE) SARS-CoV-2 target nucleic acids are NOT DETECTED. The SARS-CoV-2 RNA is generally detectable in upper respiratoy specimens during the acute phase of infection. The lowest concentration of SARS-CoV-2 viral copies this assay can detect is 131 copies/mL. A negative result does not preclude SARS-Cov-2 infection and should not be used as the sole basis for treatment or other patient management decisions. A negative result may occur with  improper specimen collection/handling, submission of specimen other than nasopharyngeal swab, presence of viral mutation(s) within the areas  targeted by this assay, and inadequate number of viral copies (<131 copies/mL). A negative result must be combined with clinical observations, patient history, and epidemiological information. The expected result is Negative. Fact Sheet for Patients:  PinkCheek.be Fact Sheet for Healthcare Providers:  GravelBags.it This test is not yet ap proved or cleared by the [place] FDA and  has been authorized for detection and/or diagnosis of SARS-CoV-2 by FDA under an Emergency Use Authorization (EUA). This EUA will remain  in effect (meaning this test can be used) for the duration of the COVID-19 declaration under Section 564(b)(1) of the Act, 21 U.S.C. section 360bbb-3(b)(1), unless the authorization is terminated or revoked sooner.    Influenza A by PCR NEGATIVE NEGATIVE   Influenza B by PCR NEGATIVE NEGATIVE    Comment: (NOTE) The Xpert  Xpress SARS-CoV-2/FLU/RSV assay is intended as an aid in  the diagnosis of influenza from Nasopharyngeal swab specimens and  should not be used as a sole basis for treatment. Nasal washings and  aspirates are unacceptable for Xpert Xpress SARS-CoV-2/FLU/RSV  testing. Fact Sheet for Patients: PinkCheek.be Fact Sheet for Healthcare Providers: GravelBags.it This test is not yet approved or cleared by the [place] FDA and  has been authorized for detection and/or diagnosis of SARS-CoV-2 by  FDA under an Emergency Use Authorization (EUA). This EUA will remain  in effect (meaning this test can be used) for the duration of the  Covid-19 declaration under Section 564(b)(1) of the Act, 21  U.S.C. section 360bbb-3(b)(1), unless the authorization is  terminated or revoked. Performed at [facility], [address] [place], [place] [address]   Urinalysis, Routine w reflex microscopic     Status: None   Collection Time: 11/26/19 11:46 AM  Result Value Ref Range   Color, Urine YELLOW YELLOW   APPearance CLEAR CLEAR   Specific Gravity, Urine 1.008 1.005 - 1.030   pH 8.0 5.0 - 8.0   Glucose, UA NEGATIVE NEGATIVE mg/dL   Hgb urine dipstick NEGATIVE NEGATIVE   Bilirubin Urine NEGATIVE NEGATIVE   Ketones, ur NEGATIVE NEGATIVE mg/dL   Protein, ur NEGATIVE NEGATIVE mg/dL   Nitrite NEGATIVE NEGATIVE   Leukocytes,Ua NEGATIVE NEGATIVE    Comment: Performed at [facility], [address] [place], [place] [address]  Troponin I (High Sensitivity)     Status: None   Collection Time: 11/26/19 12:32 PM  Result Value Ref Range   Troponin I (High Sensitivity) 4 <18 ng/L    Comment: (NOTE) Elevated high sensitivity troponin I (hsTnI) values and significant  changes across serial measurements may suggest ACS but many other  chronic and acute conditions are known to elevate hsTnI results.  Refer to the "Links" section for chest pain algorithms and  additional  guidance. Performed at [facility], [address], [place], [place] [address]     RADIOGRAPHIC STUDIES: I have personally reviewed the radiological images as listed and agreed with the findings in the report. DG Chest 2 View  Result Date: 11/26/2019 CLINICAL DATA:  Chest pain, shortness of breath, and wheezing. COPD. Smoker. EXAM: CHEST - 2 VIEW COMPARISON:  None. FINDINGS: The heart size and mediastinal contours are within normal limits. Both lungs are clear. Cervical spine fusion hardware again noted. IMPRESSION: No active cardiopulmonary disease. Electronically Signed   By: Marlaine Hind M.D.   On: 11/26/2019 10:31    ASSESSMENT & PLAN:  Leukocytosis 1.  Leukocytosis: -Patient seen at the request of Dr. Gerarda Fraction for further work-up and management of leukocytosis. -  CBC on 10/24/2019 showed white count of 14.5 with normal hemoglobin and platelet count.  Differential showed elevated neutrophil count, lymphocyte count, monocytes and eosinophils. -Patient reports that several of her prior CBCs have shown elevated white counts. -She reportedly had a bad reaction to COVID-19 vaccine towards the end of February.  She had double pneumonia last year and thinks it is probably Covid infection. -Denies any fevers, night sweats or weight loss.  She has started gaining weight since she quit smoking 3 weeks ago. -She was recently on prednisone for 5 days on 11/26/2019 when she went to the ER with breathing problem. -She is not currently on systemic steroids.  Denies any prior history of splenectomy. -Physical examination today did not reveal any palpable adenopathy or splenomegaly. -I will repeat her CBC and review her smear.  We will check LDH, JAK2 V617F mutation and BCR/ABL.  We will also send for flow cytometry.  We will see her back in 2 weeks for follow-up.  2.  Asthma: -She follows up with [facility] pulmonology.  She was recently started on Breo Ellipta and Pepcid. -She quit smoking 3 weeks  ago.  Smoked half pack per day for 40 years. -She is being arranged for lung cancer screening program with  pulmonology.  3.  Family history: -Paternal grandmother and paternal aunt had "bone cancer". -Maternal grandfather had leukemia.  Mother had breast cancer but died of acute myeloid leukemia.  Maternal aunt had metastatic cancer.  Another maternal aunt and maternal cousin died of metastatic breast cancer.  4.  Health maintenance: -Her last mammogram was in 2019, BI-RADS 1.  She reports that she is scheduled for her mammogram soon.     All questions were answered. The patient knows to call the clinic with any problems, questions or concerns.      Derek Jack, MD 12/13/19 2:48 PM

## 2019-12-13 NOTE — Patient Instructions (Addendum)
[place] at [facility] Discharge Instructions  You were seen today by Dr. Delton Coombes. He went over your history, family history and how you've been feeling lately. You will have blood drawn before you leave the hospital today. He will see you back in 2 weeks for labs and follow up.   Thank you for choosing [place] at [facility] to provide your oncology and hematology care.  To afford each patient quality time with our provider, please arrive at least 15 minutes before your scheduled appointment time.   If you have a lab appointment with the [facility] please come in thru the  Main Entrance and check in at the main information desk  You need to re-schedule your appointment should you arrive 10 or more minutes late.  We strive to give you quality time with our providers, and arriving late affects you and other patients whose appointments are after yours.  Also, if you no show three or more times for appointments you may be dismissed from the clinic at the providers discretion.     Again, thank you for choosing [facility].  Our hope is that these requests will decrease the amount of time that you wait before being seen by our physicians.       _____________________________________________________________  Should you have questions after your visit to [facility], please contact our office at [phone] between the hours of 8:00 a.m. and 4:30 p.m.  Voicemails left after 4:00 p.m. will not be returned until the following business day.  For prescription refill requests, have your pharmacy contact our office and allow 72 hours.    Cancer Center Support Programs:   > Cancer Support Group  2nd Tuesday of the month 1pm-2pm, Journey Room

## 2019-12-13 NOTE — Assessment & Plan Note (Signed)
1.  Leukocytosis: -Patient seen at the request of Dr. Gerarda Fraction for further work-up and management of leukocytosis. -CBC on 10/24/2019 showed white count of 14.5 with normal hemoglobin and platelet count.  Differential showed elevated neutrophil count, lymphocyte count, monocytes and eosinophils. -Patient reports that several of her prior CBCs have shown elevated white counts. -She reportedly had a bad reaction to COVID-19 vaccine towards the end of February.  She had double pneumonia last year and thinks it is probably Covid infection. -Denies any fevers, night sweats or weight loss.  She has started gaining weight since she quit smoking 3 weeks ago. -She was recently on prednisone for 5 days on 11/26/2019 when she went to the ER with breathing problem. -She is not currently on systemic steroids.  Denies any prior history of splenectomy. -Physical examination today did not reveal any palpable adenopathy or splenomegaly. -I will repeat her CBC and review her smear.  We will check LDH, JAK2 V617F mutation and BCR/ABL.  We will also send for flow cytometry.  We will see her back in 2 weeks for follow-up.  2.  Asthma: -She follows up with [facility] pulmonology.  She was recently started on Breo Ellipta and Pepcid. -She quit smoking 3 weeks ago.  Smoked half pack per day for 40 years. -She is being arranged for lung cancer screening program with  pulmonology.  3.  Family history: -Paternal grandmother and paternal aunt had "bone cancer". -Maternal grandfather had leukemia.  Mother had breast cancer but died of acute myeloid leukemia.  Maternal aunt had metastatic cancer.  Another maternal aunt and maternal cousin died of metastatic breast cancer.  4.  Health maintenance: -Her last mammogram was in 2019, BI-RADS 1.  She reports that she is scheduled for her mammogram soon.

## 2019-12-14 LAB — SURGICAL PATHOLOGY

## 2019-12-15 LAB — FLOW CYTOMETRY

## 2019-12-19 DIAGNOSIS — Z720 Tobacco use: Secondary | ICD-10-CM | POA: Diagnosis not present

## 2019-12-19 DIAGNOSIS — E7849 Other hyperlipidemia: Secondary | ICD-10-CM | POA: Diagnosis not present

## 2019-12-19 DIAGNOSIS — I10 Essential (primary) hypertension: Secondary | ICD-10-CM | POA: Diagnosis not present

## 2019-12-19 DIAGNOSIS — J449 Chronic obstructive pulmonary disease, unspecified: Secondary | ICD-10-CM | POA: Diagnosis not present

## 2019-12-21 LAB — CALR + JAK2 E12-15 + MPL (REFLEXED)

## 2019-12-21 LAB — JAK2 V617F, W REFLEX TO CALR/E12/MPL

## 2019-12-21 LAB — BCR-ABL1 FISH
Cells Analyzed: 200
Cells Counted: 200

## 2019-12-22 ENCOUNTER — Ambulatory Visit (HOSPITAL_COMMUNITY)
Admission: RE | Admit: 2019-12-22 | Discharge: 2019-12-22 | Disposition: A | Discharge status: Home / Self Care | Payer: PPO | Source: Ambulatory Visit | Attending: Internal Medicine | Admitting: Internal Medicine

## 2019-12-22 ENCOUNTER — Other Ambulatory Visit: Payer: Self-pay

## 2019-12-22 DIAGNOSIS — Z1231 Encounter for screening mammogram for malignant neoplasm of breast: Secondary | ICD-10-CM | POA: Diagnosis not present

## 2019-12-27 ENCOUNTER — Other Ambulatory Visit: Payer: Self-pay

## 2019-12-27 ENCOUNTER — Inpatient Hospital Stay (HOSPITAL_COMMUNITY): Payer: PPO | Attending: Hematology | Admitting: Hematology

## 2019-12-27 VITALS — BP 118/77 | HR 70 | Temp 98.2°F | Resp 18 | Wt 191.3 lb

## 2019-12-27 DIAGNOSIS — Z8249 Family history of ischemic heart disease and other diseases of the circulatory system: Secondary | ICD-10-CM | POA: Diagnosis not present

## 2019-12-27 DIAGNOSIS — Z90710 Acquired absence of both cervix and uterus: Secondary | ICD-10-CM | POA: Diagnosis not present

## 2019-12-27 DIAGNOSIS — Z79899 Other long term (current) drug therapy: Secondary | ICD-10-CM | POA: Insufficient documentation

## 2019-12-27 DIAGNOSIS — Z7951 Long term (current) use of inhaled steroids: Secondary | ICD-10-CM | POA: Diagnosis not present

## 2019-12-27 DIAGNOSIS — Z806 Family history of leukemia: Secondary | ICD-10-CM | POA: Insufficient documentation

## 2019-12-27 DIAGNOSIS — J45909 Unspecified asthma, uncomplicated: Secondary | ICD-10-CM | POA: Diagnosis not present

## 2019-12-27 DIAGNOSIS — Z87891 Personal history of nicotine dependence: Secondary | ICD-10-CM | POA: Insufficient documentation

## 2019-12-27 DIAGNOSIS — Z808 Family history of malignant neoplasm of other organs or systems: Secondary | ICD-10-CM | POA: Insufficient documentation

## 2019-12-27 DIAGNOSIS — Z803 Family history of malignant neoplasm of breast: Secondary | ICD-10-CM | POA: Diagnosis not present

## 2019-12-27 DIAGNOSIS — Z801 Family history of malignant neoplasm of trachea, bronchus and lung: Secondary | ICD-10-CM | POA: Insufficient documentation

## 2019-12-27 DIAGNOSIS — D72829 Elevated white blood cell count, unspecified: Secondary | ICD-10-CM | POA: Diagnosis not present

## 2019-12-27 DIAGNOSIS — Z833 Family history of diabetes mellitus: Secondary | ICD-10-CM | POA: Diagnosis not present

## 2019-12-27 DIAGNOSIS — Z7982 Long term (current) use of aspirin: Secondary | ICD-10-CM | POA: Diagnosis not present

## 2019-12-27 DIAGNOSIS — E7800 Pure hypercholesterolemia, unspecified: Secondary | ICD-10-CM | POA: Diagnosis not present

## 2019-12-27 NOTE — Patient Instructions (Signed)
[place] at [facility] Discharge Instructions  You were seen today by Dr. Delton Coombes. He went over your recent results and possible diagnoses. Please continue your usual care with your primary care doctor. Please return to the clinic in 6 months to see the NP/PA for labs and follow up.   Thank you for choosing [place] at [facility] to provide your oncology and hematology care.  To afford each patient quality time with our provider, please arrive at least 15 minutes before your scheduled appointment time.   If you have a lab appointment with the [facility] please come in thru the Main Entrance and check in at the main information desk  You need to re-schedule your appointment should you arrive 10 or more minutes late.  We strive to give you quality time with our providers, and arriving late affects you and other patients whose appointments are after yours.  Also, if you no show three or more times for appointments you may be dismissed from the clinic at the providers discretion.     Again, thank you for choosing [facility].  Our hope is that these requests will decrease the amount of time that you wait before being seen by our physicians.       _____________________________________________________________  Should you have questions after your visit to [facility], please contact our office at [phone] between the hours of 8:00 a.m. and 4:30 p.m.  Voicemails left after 4:00 p.m. will not be returned until the following business day.  For prescription refill requests, have your pharmacy contact our office and allow 72 hours.    Cancer Center Support Programs:   > Cancer Support Group  2nd Tuesday of the month 1pm-2pm, Journey Room

## 2019-12-27 NOTE — Progress Notes (Signed)
[address] [place], [place] [address]   CLINIC:  Medical Oncology/Hematology  PCP:  [facility], [place] / [place] [address]  [phone]  REASON FOR VISIT:  Follow-up for leukocytosis  PRIOR THERAPY: None  CURRENT THERAPY: Observation.  INTERVAL HISTORY:  [name], a 64 y.o. female, returns for routine follow-up for her leukocytosis. [name] was last seen on 12/13/2019.  She reports gaining over 10 lbs of weight in the last month after quitting smoking. Her appetite is stable and she eats just 1 meal a day, which is lunch. She is having her PFT on July 9th and a CT scan prior to that. She had a severe reaction to her Moderna COVID vaccine in February and had to go to the hospital.    REVIEW OF SYSTEMS:  Review of Systems  Constitutional: Positive for appetite change (mildly decreased). Negative for fatigue.  Respiratory: Negative for cough.   All other systems reviewed and are negative.   PAST MEDICAL/SURGICAL HISTORY:  Past Medical History:  Diagnosis Date  . Asthma   . Diverticulosis   . Hypercholesteremia   . Pneumonia 12/2018   double pneumonia  . PONV (postoperative nausea and vomiting)   . Seasonal allergies   . Shingles   . Varicose veins    Past Surgical History:  Procedure Laterality Date  . ABDOMINAL HYSTERECTOMY     partial  . BACK SURGERY     cervical surgery  . COLONOSCOPY N/A 02/04/2017   Procedure: COLONOSCOPY;  Surgeon: Rogene Houston, MD;  Location: [facility];  Service: Endoscopy;  Laterality: N/A;  930  . ENDOSCOPIC CONCHA BULLOSA RESECTION Bilateral 03/21/2019   Procedure: ENDOSCOPIC CONCHA BULLOSA RESECTION;  Surgeon: Leta Baptist, MD;  Location: [facility];  Service: ENT;  Laterality: Bilateral;  . NASAL SEPTOPLASTY W/ TURBINOPLASTY Bilateral 03/21/2019   Procedure: NASAL SEPTOPLASTY WITH BILATERAL TURBINATE REDUCTION;  Surgeon: Leta Baptist, MD;  Location: [facility];  Service: ENT;  Laterality: Bilateral;  . TONSILLECTOMY    . WRIST SURGERY     age 62yr    SOCIAL HISTORY:  Social History   Socioeconomic History  . Marital status: Widowed    Spouse name: Not on file  . Number of children: 0  . Years of education: Not on file  . Highest education level: Not on file  Occupational History  . Not on file  Tobacco Use  . Smoking status: Former Smoker    Packs/day: 0.50    Years: 45.00    Pack years: 22.50    Types: Cigarettes    Quit date: 11/23/2019    Years since quitting: 0.0  . Smokeless tobacco: Never Used  . Tobacco comment: quit smoking on 11/23/19   Substance and Sexual Activity  . Alcohol use: Yes    Alcohol/week: 0.0 standard drinks    Comment: occ  . Drug use: No  . Sexual activity: Not Currently    Birth control/protection: Post-menopausal  Other Topics Concern  . Not on file  Social History Narrative  . Not on file   Social Determinants of Health   Financial Resource Strain:   . Difficulty of Paying Living Expenses:   Food Insecurity:   . Worried About RCharity fundraiserin the Last Year:   . RArboriculturistin the Last Year:   Transportation Needs:   . LFilm/video editor(Medical):   .Marland KitchenLack of Transportation (  Non-Medical):   Physical Activity:   . Days of Exercise per Week:   . Minutes of Exercise per Session:   Stress:   . Feeling of Stress :   Social Connections:   . Frequency of Communication with Friends and Family:   . Frequency of Social Gatherings with Friends and Family:   . Attends Religious Services:   . Active Member of Clubs or Organizations:   . Attends Archivist Meetings:   Marland Kitchen Marital Status:   Intimate Partner Violence:   . Fear of Current or Ex-Partner:   . Emotionally Abused:   Marland Kitchen Physically Abused:   . Sexually Abused:     FAMILY HISTORY:  Family History  Problem Relation Age of Onset  . Cancer Mother        breast  . Heart attack Mother   . Acute  myelogenous leukemia Mother   . Gallbladder disease Father   . Diabetes Brother   . Thyroid cancer Maternal Aunt   . Brain cancer Maternal Aunt   . Bone cancer Paternal Aunt   . Diabetes Maternal Grandmother   . Dementia Maternal Grandmother   . Leukemia Maternal Grandfather   . Bone cancer Paternal Grandmother   . Breast cancer Maternal Aunt   . Lung cancer Maternal Aunt   . Skin cancer Brother   . Diabetes Brother   . Colon cancer Neg Hx     CURRENT MEDICATIONS:  Current Outpatient Medications  Medication Sig Dispense Refill  . aspirin 81 MG EC tablet Take 81 mg by mouth every other day.    . B Complex-C (SUPER B COMPLEX PO) Take 1 tablet by mouth daily.    . cetirizine (ZYRTEC ALLERGY) 10 MG tablet Take 1 tablet (10 mg total) by mouth daily. 90 tablet 3  . Cholecalciferol (VITAMIN D3) 25 MCG (1000 UT) CAPS Take 1 capsule by mouth daily.    Marland Kitchen conjugated estrogens (PREMARIN) vaginal cream Place 1 Applicatorful vaginally every other day. 42.5 g 5  . Cranberry 1000 MG CAPS Take 1 capsule by mouth daily.    . famotidine (PEPCID) 20 MG tablet Take 1 tablet (20 mg total) by mouth at bedtime. 90 tablet 3  . fluticasone furoate-vilanterol (BREO ELLIPTA) 200-25 MCG/INH AEPB Inhale 1 puff into the lungs daily. 30 each 11  . Garlic 0017 MG CAPS Take 1 capsule by mouth daily.    . Multiple Vitamins-Minerals (HAIR SKIN AND NAILS FORMULA) TABS Take 2 tablets by mouth daily.    . Multiple Vitamins-Minerals (MULTIVITAMIN ADULTS PO) Take 1 tablet by mouth daily.    . Omega-3 Fatty Acids (FISH OIL) 1000 MG CAPS Take 1 capsule by mouth daily.     No current facility-administered medications for this visit.    ALLERGIES:  Allergies  Allergen Reactions  . Bee Venom Anaphylaxis  . Codeine Nausea And Vomiting  . Other Nausea And Vomiting    General anesthesia     PHYSICAL EXAM:  Performance status (ECOG): 0 - Asymptomatic  There were no vitals filed for this visit. Wt Readings from Last 3  Encounters:  12/13/19 188 lb 14.4 oz (85.7 kg)  12/07/19 185 lb 3.2 oz (84 kg)  11/26/19 178 lb (80.7 kg)   Physical Exam Vitals reviewed.  Constitutional:      Appearance: Normal appearance.  Cardiovascular:     Rate and Rhythm: Normal rate and regular rhythm.     Pulses: Normal pulses.     Heart sounds: Normal heart sounds.  Pulmonary:     Effort: Pulmonary effort is normal.     Breath sounds: Normal breath sounds.  Neurological:     General: No focal deficit present.     Mental Status: She is alert and oriented to person, place, and time.  Psychiatric:        Mood and Affect: Mood normal.        Behavior: Behavior normal.     LABORATORY DATA:  I have reviewed the labs as listed.  CBC Latest Ref Rng & Units 12/13/2019 11/26/2019 03/15/2016  WBC 4.0 - 10.5 K/uL 9.5 8.3 9.1  Hemoglobin 12.0 - 15.0 g/dL 12.4 13.2 13.3  Hematocrit 36.0 - 46.0 % 38.0 40.4 39.0  Platelets 150 - 400 K/uL 218 277 270   CMP Latest Ref Rng & Units 11/26/2019 03/15/2016  Glucose 70 - 99 mg/dL 94 97  BUN 8 - 23 mg/dL 18 22(H)  Creatinine 0.44 - 1.00 mg/dL 0.70 1.00  Sodium 135 - 145 mmol/L 138 137  Potassium 3.5 - 5.1 mmol/L 4.4 4.2  Chloride 98 - 111 mmol/L 103 103  CO2 22 - 32 mmol/L 28 24  Calcium 8.9 - 10.3 mg/dL 8.9 9.7  Total Protein 6.5 - 8.1 g/dL 7.0 7.2  Total Bilirubin 0.3 - 1.2 mg/dL 0.7 0.6  Alkaline Phos 38 - 126 U/L 77 70  AST 15 - 41 U/L 25 24  ALT 0 - 44 U/L 21 18      Component Value Date/Time   RBC 3.98 12/13/2019 1450   MCV 95.5 12/13/2019 1450   MCH 31.2 12/13/2019 1450   MCHC 32.6 12/13/2019 1450   RDW 12.1 12/13/2019 1450   LYMPHSABS 3.6 12/13/2019 1450   MONOABS 0.9 12/13/2019 1450   EOSABS 0.7 (H) 12/13/2019 1450   BASOSABS 0.1 12/13/2019 1450    DIAGNOSTIC IMAGING:  I have independently reviewed the scans and discussed with the patient. MM 3D SCREEN BREAST BILATERAL  Result Date: 12/23/2019 CLINICAL DATA:  Screening. EXAM: DIGITAL SCREENING BILATERAL MAMMOGRAM  WITH TOMO AND CAD COMPARISON:  Previous exam(s). ACR Breast Density Category a: The breast tissue is almost entirely fatty. FINDINGS: There are no findings suspicious for malignancy. Images were processed with CAD. IMPRESSION: No mammographic evidence of malignancy. A result letter of this screening mammogram will be mailed directly to the patient. RECOMMENDATION: Screening mammogram in one year. (Code:SM-B-01Y) BI-RADS CATEGORY  1: Negative. Electronically Signed   By: Lovey Newcomer M.D.   On: 12/23/2019 11:45     ASSESSMENT:  1.  Leukocytosis: -Patient seen at the request of Dr. Gerarda Fraction as CBC on 10/24/2019 showed white count 14.5 with normal hemoglobin and platelets. -She reported that several of her prior CBCs have shown elevated white counts. -Labs on 12/13/2019 shows normal white count 9.5.  Mild eosinophilia. -JAK2 V617F and reflex testing, BCR/ABL was negative.  Flow cytometry was negative.  2.  Family history: -Paternal grandmother had and paternal aunt had "bone cancer". -Maternal grandfather had leukemia.  Mother had breast cancer but died of acute myeloid leukemia.  Maternal aunt had metastatic cancer.  Another maternal aunt and maternal cousin died of metastatic breast cancer.   PLAN:  1.  Leukocytosis: -We reviewed results from 12/13/2019.  CBC has normalized.  Chronic myeloproliferative disorder testing was negative. -No further work-up needed.  If there is any significant changes, will consider bone marrow biopsy.  We will follow her in 6 months with repeat labs.  2.  Asthma: -She is on Breo Ellipta and Pepcid. -  She quit smoking about 5 weeks ago.  Smoked half pack per day for 40 years. -She will soon have lung cancer screening CT scan.  She is also scheduled for PFTs.  3.  Health maintenance: -Mammogram on 12/22/2019 was BI-RADS Category 1.   Orders placed this encounter:  No orders of the defined types were placed in this encounter.    Derek Jack, MD [place] [phone]   I, Milinda Antis, am acting as a scribe for Dr. Sanda Linger.  I, Derek Jack MD, have reviewed the above documentation for accuracy and completeness, and I agree with the above.

## 2020-01-16 ENCOUNTER — Ambulatory Visit (INDEPENDENT_AMBULATORY_CARE_PROVIDER_SITE_OTHER): Payer: PPO | Admitting: Acute Care

## 2020-01-16 ENCOUNTER — Ambulatory Visit (HOSPITAL_COMMUNITY)
Admission: RE | Admit: 2020-01-16 | Discharge: 2020-01-16 | Disposition: A | Discharge status: Home / Self Care | Payer: PPO | Source: Ambulatory Visit | Attending: Acute Care | Admitting: Acute Care

## 2020-01-16 ENCOUNTER — Encounter: Payer: Self-pay | Admitting: Acute Care

## 2020-01-16 ENCOUNTER — Other Ambulatory Visit: Payer: Self-pay

## 2020-01-16 DIAGNOSIS — Z87891 Personal history of nicotine dependence: Secondary | ICD-10-CM | POA: Insufficient documentation

## 2020-01-16 DIAGNOSIS — F17210 Nicotine dependence, cigarettes, uncomplicated: Secondary | ICD-10-CM | POA: Diagnosis not present

## 2020-01-16 DIAGNOSIS — Z122 Encounter for screening for malignant neoplasm of respiratory organs: Secondary | ICD-10-CM | POA: Insufficient documentation

## 2020-01-16 NOTE — Patient Instructions (Signed)
Thank you for participating in the [facility]. It was our pleasure to meet you today. We will call you with the results of your scan within the next few days. Your scan will be assigned a Lung RADS category score by the physicians reading the scans.  This Lung RADS score determines follow up scanning.  See below for description of categories, and follow up screening recommendations. We will be in touch to schedule your follow up screening annually or based on recommendations of our providers. We will fax a copy of your scan results to your Primary Care Physician, or the physician who referred you to the program, to ensure they have the results. Please call the office if you have any questions or concerns regarding your scanning experience or results.  Our office number is [phone]. Please speak with Denise Phelps, RN. She is our Lung Cancer Screening RN. If she is unavailable when you call, please have the office staff send her a message. She will return your call at her earliest convenience. Remember, if your scan is normal, we will scan you annually as long as you continue to meet the criteria for the program. (Age 55-77, Current smoker or smoker who has quit within the last 15 years). If you are a smoker, remember, quitting is the single most powerful action that you can take to decrease your risk of lung cancer and other pulmonary, breathing related problems. We know quitting is hard, and we are here to help.  Please let us know if there is anything we can do to help you meet your goal of quitting. If you are a former smoker, congratulations. We are proud of you! Remain smoke free! Remember you can refer friends or family members through the number above.  We will screen them to make sure they meet criteria for the program. Thank you for helping us take better care of you by participating in Lung Screening.  Lung RADS Categories:  Lung RADS 1: no nodules  or definitely non-concerning nodules.  Recommendation is for a repeat annual scan in 12 months.  Lung RADS 2:  nodules that are non-concerning in appearance and behavior with a very low likelihood of becoming an active cancer. Recommendation is for a repeat annual scan in 12 months.  Lung RADS 3: nodules that are probably non-concerning , includes nodules with a low likelihood of becoming an active cancer.  Recommendation is for a 6-month repeat screening scan. Often noted after an upper respiratory illness. We will be in touch to make sure you have no questions, and to schedule your 6-month scan.  Lung RADS 4 A: nodules with concerning findings, recommendation is most often for a follow up scan in 3 months or additional testing based on our provider's assessment of the scan. We will be in touch to make sure you have no questions and to schedule the recommended 3 month follow up scan.  Lung RADS 4 B:  indicates findings that are concerning. We will be in touch with you to schedule additional diagnostic testing based on our provider's  assessment of the scan.   

## 2020-01-16 NOTE — Progress Notes (Signed)
Shared Decision Making Visit Lung Cancer Screening Program [phone])   Eligibility:  Age 64 y.o.  Pack Years Smoking History Calculation 30 pack years (# packs/per year x # years smoked)  Recent History of coughing up blood  no  Unexplained weight loss? no ( >Than 15 pounds within the last 6 months )  Prior History Lung / other cancer no (Diagnosis within the last 5 years already requiring surveillance chest CT Scans).  Smoking Status current every day smoker  Former Smokers: Years since quit: < 1 year  Quit Date: 11/2019( Less than 1 month ago)  Visit Components:  Discussion included one or more decision making aids. yes  Discussion included risk/benefits of screening. yes  Discussion included potential follow up diagnostic testing for abnormal scans. yes  Discussion included meaning and risk of over diagnosis. yes  Discussion included meaning and risk of False Positives. yes  Discussion included meaning of total radiation exposure. yes  Counseling Included:  Importance of adherence to annual lung cancer LDCT screening. yes  Impact of comorbidities on ability to participate in the program. yes  Ability and willingness to under diagnostic treatment. yes  Smoking Cessation Counseling:  Current Smokers:   Discussed importance of smoking cessation. yes  Information about tobacco cessation classes and interventions provided to patient. yes  Patient provided with "ticket" for LDCT Scan. yes  Symptomatic Patient. no  Counseling  Diagnosis Code: Tobacco Use Z72.0  Asymptomatic Patient yes  Counseling (Intermediate counseling: > three minutes counseling) [id number]  Former Smokers:   Discussed the importance of maintaining cigarette abstinence. yes  Diagnosis Code: Personal History of Nicotine Dependence. J69.678  Information about tobacco cessation classes and interventions provided to patient. Yes  Patient provided with "ticket" for LDCT Scan. yes  Written  Order for Lung Cancer Screening with LDCT placed in Epic. Yes (CT Chest Lung Cancer Screening Low Dose W/O CM) [id number] Z12.2-Screening of respiratory organs Z87.891-Personal history of nicotine dependence   Magdalen Spatz, NP 01/16/2020

## 2020-01-18 DIAGNOSIS — J449 Chronic obstructive pulmonary disease, unspecified: Secondary | ICD-10-CM | POA: Diagnosis not present

## 2020-01-18 DIAGNOSIS — Z720 Tobacco use: Secondary | ICD-10-CM | POA: Diagnosis not present

## 2020-01-18 DIAGNOSIS — I10 Essential (primary) hypertension: Secondary | ICD-10-CM | POA: Diagnosis not present

## 2020-01-18 DIAGNOSIS — E7849 Other hyperlipidemia: Secondary | ICD-10-CM | POA: Diagnosis not present

## 2020-01-19 ENCOUNTER — Other Ambulatory Visit: Payer: Self-pay | Admitting: *Deleted

## 2020-01-19 DIAGNOSIS — Z87891 Personal history of nicotine dependence: Secondary | ICD-10-CM

## 2020-01-19 NOTE — Progress Notes (Signed)
Please call patient and let them  know their  low dose Ct was read as a Lung RADS 2: nodules that are benign in appearance and behavior with a very low likelihood of becoming a clinically active cancer due to size or lack of growth. Recommendation per radiology is for a repeat LDCT in 12 months. .Please let them  know we will order and schedule their  annual screening scan for 12/2020.  Please place order for annual  screening scan for  12/2020 and fax results to PCP. Thanks so much.

## 2020-01-24 ENCOUNTER — Other Ambulatory Visit (HOSPITAL_COMMUNITY): Payer: PPO

## 2020-01-24 ENCOUNTER — Other Ambulatory Visit (HOSPITAL_COMMUNITY)
Admission: RE | Admit: 2020-01-24 | Discharge: 2020-01-24 | Disposition: A | Discharge status: Home / Self Care | Payer: PPO | Source: Ambulatory Visit | Attending: Pulmonary Disease | Admitting: Pulmonary Disease

## 2020-01-24 DIAGNOSIS — Z01812 Encounter for preprocedural laboratory examination: Secondary | ICD-10-CM | POA: Insufficient documentation

## 2020-01-24 DIAGNOSIS — Z20822 Contact with and (suspected) exposure to covid-19: Secondary | ICD-10-CM | POA: Diagnosis not present

## 2020-01-24 LAB — SARS CORONAVIRUS 2 (TAT 6-24 HRS): SARS Coronavirus 2: NEGATIVE

## 2020-01-27 ENCOUNTER — Ambulatory Visit: Payer: PPO | Admitting: Pulmonary Disease

## 2020-01-27 ENCOUNTER — Other Ambulatory Visit: Payer: Self-pay

## 2020-01-27 ENCOUNTER — Encounter: Payer: Self-pay | Admitting: Pulmonary Disease

## 2020-01-27 ENCOUNTER — Ambulatory Visit (INDEPENDENT_AMBULATORY_CARE_PROVIDER_SITE_OTHER): Payer: PPO | Admitting: Internal Medicine

## 2020-01-27 VITALS — BP 120/70 | HR 80 | Temp 97.8°F | Ht 66.0 in | Wt 193.0 lb

## 2020-01-27 DIAGNOSIS — F17210 Nicotine dependence, cigarettes, uncomplicated: Secondary | ICD-10-CM | POA: Diagnosis not present

## 2020-01-27 DIAGNOSIS — J449 Chronic obstructive pulmonary disease, unspecified: Secondary | ICD-10-CM

## 2020-01-27 DIAGNOSIS — J4550 Severe persistent asthma, uncomplicated: Secondary | ICD-10-CM

## 2020-01-27 DIAGNOSIS — F17200 Nicotine dependence, unspecified, uncomplicated: Secondary | ICD-10-CM | POA: Insufficient documentation

## 2020-01-27 LAB — PULMONARY FUNCTION TEST
DL/VA % pred: 93 %
DL/VA: 3.84 ml/min/mmHg/L
DLCO cor % pred: 95 %
DLCO cor: 20.17 ml/min/mmHg
DLCO unc % pred: 95 %
DLCO unc: 20.17 ml/min/mmHg
FEF 25-75 Post: 2.16 L/sec
FEF 25-75 Pre: 1.58 L/sec
FEF2575-%Change-Post: 36 %
FEF2575-%Pred-Post: 98 %
FEF2575-%Pred-Pre: 71 %
FEV1-%Change-Post: 9 %
FEV1-%Pred-Post: 88 %
FEV1-%Pred-Pre: 80 %
FEV1-Post: 2.3 L
FEV1-Pre: 2.09 L
FEV1FVC-%Change-Post: 4 %
FEV1FVC-%Pred-Pre: 95 %
FEV6-%Change-Post: 5 %
FEV6-%Pred-Post: 91 %
FEV6-%Pred-Pre: 87 %
FEV6-Post: 2.98 L
FEV6-Pre: 2.84 L
FEV6FVC-%Pred-Post: 104 %
FEV6FVC-%Pred-Pre: 104 %
FVC-%Change-Post: 5 %
FVC-%Pred-Post: 88 %
FVC-%Pred-Pre: 83 %
FVC-Post: 2.98 L
FVC-Pre: 2.84 L
Post FEV1/FVC ratio: 77 %
Post FEV6/FVC ratio: 100 %
Pre FEV1/FVC ratio: 74 %
Pre FEV6/FVC Ratio: 100 %
RV % pred: 109 %
RV: 2.43 L
TLC % pred: 102 %
TLC: 5.47 L

## 2020-01-27 NOTE — Progress Notes (Signed)
@Patient  ID: [name], female    DOB: [birth year], 64 y.o.   MRN: [id number]  Chief Complaint  Patient presents with  . Follow-up    PFT today    Referring provider: Redmond School, MD  HPI:  64 year old female current everyday smoker followed in our office for suspected asthma COPD or lab syndrome  PMH: Varicose veins, chronic cystitis Smoker/ Smoking History: Current smoker.  Smoking 4 cigarettes a day.  33.75-pack-year smoking history Maintenance: Breo Ellipta 200 Pt of: Dr. Tamala Julian  01/27/2020  - Visit   64 year old female current everyday smoker followed in our office for COPD likely asthma COPD overlap syndrome.  Patient is presenting today after completing pulmonary function testing.  Those results are listed below:  01/27/2020-pulmonary function test-FVC 2.84 (83% predicted), postbronchodilator ratio 77, postbronchodilator FEV1 2.30 (88%), no positive bronchodilator response, DLCO 20.17 (95% predicted)  Patient has also been established with a lung cancer screening program and those results are listed below   01/17/2020-lung cancer screening CT-centrilobular emphysema noted, lung RADS 2, tiny pulmonary nodules in the right lung measure up to maximum of 4.2 mm  Patient was last seen in our office in May/2021 for initial consult with Dr. Tamala Julian.  She was started on Brio Ellipta inhaler.  Lab work was ordered.  It does not look like this was completed.  She was walked in office and found to not require oxygen.  Her Stiolto and duo nebs were stopped.  Patient reporting today that she enjoys her Breo 200.  She thinks that this is much better than her Stiolto Respimat.  Previous blood work shows an elevated eosinophil count of 700.  She is also been evaluated by hematology.  That assessment and plan as listed below:  PLAN:  1. Leukocytosis: -We reviewed results from 12/13/2019.  CBC has normalized.  Chronic myeloproliferative disorder testing was negative. -No further  work-up needed.  If there is any significant changes, will consider bone marrow biopsy.  We will follow her in 6 months with repeat labs.  2. Asthma: -She is on Breo Ellipta and Pepcid. -She quit smoking about 5 weeks ago.  Smoked half pack per day for 40 years. -She will soon have lung cancer screening CT scan.  She is also scheduled for PFTs.  3. Health maintenance: -Mammogram on 12/22/2019 was BI-RADS Category 1.  Patient reports she continues to use her daily allergy pill.  She has not needed to use her rescue inhaler since starting Breo Ellipta.  Unfortunately she does still continue to smoke.  She smokes around 4 cigarettes a day.  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  We will discuss this today.  Patient still needs to obtain IgE testing which was previously ordered at last office visit.  We will discuss this today.  Questionaires / Pulmonary Flowsheets:   ACT:  No flowsheet data found.  MMRC: No flowsheet data found.  Epworth:  No flowsheet data found.  Tests:   01/27/2020-pulmonary function test-FVC 2.84 (83% predicted), postbronchodilator ratio 77, postbronchodilator FEV1 2.30 (88% predicted), no bronchodilator response, mid flow reversibility, DLCO 20.17 (95% predicted  11/26/2019-CBC with differential-eosinophils 0.7  01/17/2020-lung cancer screening CT-centrilobular emphysema noted, lung RADS 2, tiny pulmonary nodules in the right lung measure up to maximum of 4.2 mm   FENO:  No results found for: NITRICOXIDE  PFT: PFT Results Latest Ref Rng & Units 01/27/2020  FVC-Pre L 2.84  FVC-Predicted Pre % 83  FVC-Post L 2.98  FVC-Predicted Post % 88  Pre FEV1/FVC % % 74  Post FEV1/FCV % % 77  FEV1-Pre L 2.09  FEV1-Predicted Pre % 80  FEV1-Post L 2.30  DLCO UNC% % 95  DLCO COR %Predicted % 93  TLC L 5.47  TLC % Predicted % 102  RV % Predicted % 109    WALK:  No flowsheet data  found.  Imaging: CT CHEST LUNG CA SCREEN LOW DOSE W/O CM  Result Date: 01/17/2020 CLINICAL DATA:  64 year old female with 30 pack-year history of smoking. Lung cancer screening. EXAM: CT CHEST WITHOUT CONTRAST LOW-DOSE FOR LUNG CANCER SCREENING TECHNIQUE: Multidetector CT imaging of the chest was performed following the standard protocol without IV contrast. COMPARISON:  Standard CT chest 10/23/2006. FINDINGS: Cardiovascular: The heart size is normal. No substantial pericardial effusion. No thoracic aortic aneurysm. Mediastinum/Nodes: No mediastinal lymphadenopathy. No evidence for gross hilar lymphadenopathy although assessment is limited by the lack of intravenous contrast on today's study. The esophagus has normal imaging features. There is no axillary lymphadenopathy. Lungs/Pleura: Centrilobular emphsyema noted. Tiny pulmonary nodules in the right lung measure up to maximum volume derived equivalent diameter of 4.2 mm. No suspicious pulmonary nodule or mass. No focal airspace consolidation. No pleural effusion. Upper Abdomen: Unremarkable. Musculoskeletal: No worrisome lytic or sclerotic osseous abnormality. IMPRESSION: 1. Lung-RADS 2, benign appearance or behavior. Continue annual screening with low-dose chest CT without contrast in 12 months. 2. Emphysema (ICD10-J43.9). Electronically Signed   By: Misty Stanley M.D.   On: 01/17/2020 08:30    Lab Results:  CBC    Component Value Date/Time   WBC 9.5 12/13/2019 1450   RBC 3.98 12/13/2019 1450   HGB 12.4 12/13/2019 1450   HCT 38.0 12/13/2019 1450   PLT 218 12/13/2019 1450   MCV 95.5 12/13/2019 1450   MCH 31.2 12/13/2019 1450   MCHC 32.6 12/13/2019 1450   RDW 12.1 12/13/2019 1450   LYMPHSABS 3.6 12/13/2019 1450   MONOABS 0.9 12/13/2019 1450   EOSABS 0.7 (H) 12/13/2019 1450   BASOSABS 0.1 12/13/2019 1450    BMET    Component Value Date/Time   NA 138 11/26/2019 1024   K 4.4 11/26/2019 1024   CL 103 11/26/2019 1024   CO2 28 11/26/2019  1024   GLUCOSE 94 11/26/2019 1024   BUN 18 11/26/2019 1024   CREATININE 0.70 11/26/2019 1024   CALCIUM 8.9 11/26/2019 1024   GFRNONAA >60 11/26/2019 1024   GFRAA >60 11/26/2019 1024    BNP    Component Value Date/Time   BNP 19.0 11/26/2019 1024    ProBNP No results found for: PROBNP  Specialty Problems      Pulmonary Problems   Asthma-COPD overlap syndrome (HCC)      Allergies  Allergen Reactions  . Bee Venom Anaphylaxis  . Anesthesia S-I-40  [Propofol] Nausea And Vomiting  . Codeine Nausea And Vomiting  . Other Nausea And Vomiting    General anesthesia     Immunization History  Administered Date(s) Administered  . Moderna SARS-COVID-2 Vaccination 09/15/2019    Past Medical History:  Diagnosis Date  . Asthma   . Diverticulosis   . Hypercholesteremia   . Pneumonia 12/2018   double pneumonia  . PONV (postoperative nausea and vomiting)   . Seasonal allergies   . Shingles   . Varicose veins     Tobacco History: Social History   Tobacco Use  Smoking Status Current Every Day Smoker  . Packs/day:  0.55  . Years: 45.00  . Pack years: 33.75  . Types: Cigarettes  Smokeless Tobacco Never Used  Tobacco Comment   Averaging 4 cigarettes daily   Ready to quit: No Counseling given: Yes Comment: Averaging 4 cigarettes daily   Smoking assessment and cessation counseling  Patient currently smoking: 4 cigarettes a day I have advised the patient to quit/stop smoking as soon as possible due to high risk for multiple medical problems.  It will also be very difficult for [place] to manage patient's  respiratory symptoms and status if we continue to expose her lungs to a known irritant.  We do not advise e-cigarettes as a form of stopping smoking.  Patient is not willing to quit smoking.  Patient is not interested in stopping smoking at this time.  She reports that she has too much stress going on her life due to her job and small business.  If and when she does decide  to seek assistance with stopping smoking.  She is not interested in Chantix.  I have advised the patient that we can assist and have options of nicotine replacement therapy, provided smoking cessation education today, provided smoking cessation counseling, and provided cessation resources.  Follow-up next office visit office visit for assessment of smoking cessation.    Smoking cessation counseling advised for: 3 minutes    Outpatient Encounter Medications as of 01/27/2020  Medication Sig  . aspirin 81 MG EC tablet Take 81 mg by mouth every other day.  . B Complex-C (SUPER B COMPLEX PO) Take 1 tablet by mouth daily.  . cetirizine (ZYRTEC ALLERGY) 10 MG tablet Take 1 tablet (10 mg total) by mouth daily.  . Cholecalciferol (VITAMIN D3) 25 MCG (1000 UT) CAPS Take 1 capsule by mouth daily.  Marland Kitchen conjugated estrogens (PREMARIN) vaginal cream Place 1 Applicatorful vaginally every other day. (Patient taking differently: Place 1 Applicatorful vaginally once a week. )  . Cranberry 1000 MG CAPS Take 1 capsule by mouth daily.  . famotidine (PEPCID) 20 MG tablet Take 1 tablet (20 mg total) by mouth at bedtime.  . fluticasone furoate-vilanterol (BREO ELLIPTA) 200-25 MCG/INH AEPB Inhale 1 puff into the lungs daily.  . Garlic 3716 MG CAPS Take 1 capsule by mouth daily.  . Multiple Vitamins-Minerals (HAIR SKIN AND NAILS FORMULA) TABS Take 1 tablet by mouth daily.   . Multiple Vitamins-Minerals (MULTIVITAMIN ADULTS PO) Take 1 tablet by mouth daily.  . Omega-3 Fatty Acids (FISH OIL) 1000 MG CAPS Take 1 capsule by mouth daily.   No facility-administered encounter medications on file as of 01/27/2020.     Review of Systems  Review of Systems  Constitutional: Negative for activity change, fatigue and fever.  HENT: Negative for sinus pressure, sinus pain and sore throat.   Respiratory: Negative for cough, shortness of breath and wheezing.   Cardiovascular: Negative for chest pain and palpitations.   Gastrointestinal: Negative for diarrhea, nausea and vomiting.  Musculoskeletal: Negative for arthralgias.  Neurological: Negative for dizziness.  Psychiatric/Behavioral: Negative for sleep disturbance. The patient is not nervous/anxious.      Physical Exam  BP 120/70 (BP Location: Left Arm, Cuff Size: Normal)   Pulse 80   Temp 97.8 F (36.6 C) (Oral)   Ht 5' 6"  (1.676 m)   Wt 193 lb (87.5 kg)   SpO2 98%   BMI 31.15 kg/m   Wt Readings from Last 5 Encounters:  01/27/20 193 lb (87.5 kg)  12/27/19 191 lb 4.8 oz (86.8 kg)  12/13/19 188  lb 14.4 oz (85.7 kg)  12/07/19 185 lb 3.2 oz (84 kg)  11/26/19 178 lb (80.7 kg)    BMI Readings from Last 5 Encounters:  01/27/20 31.15 kg/m  12/27/19 30.88 kg/m  12/13/19 30.49 kg/m  12/07/19 29.89 kg/m  11/26/19 27.88 kg/m     Physical Exam Vitals and nursing note reviewed.  Constitutional:      General: She is not in acute distress.    Appearance: Normal appearance. She is obese.  HENT:     Head: Normocephalic and atraumatic.     Right Ear: External ear normal.     Left Ear: External ear normal.     Nose: Nose normal. No congestion.     Mouth/Throat:     Mouth: Mucous membranes are moist.     Pharynx: Oropharynx is clear.  Eyes:     Pupils: Pupils are equal, round, and reactive to light.  Cardiovascular:     Rate and Rhythm: Normal rate and regular rhythm.     Pulses: Normal pulses.     Heart sounds: Normal heart sounds. No murmur heard.   Pulmonary:     Effort: Pulmonary effort is normal. No respiratory distress.     Breath sounds: Normal breath sounds. No decreased air movement. No decreased breath sounds, wheezing or rales.  Musculoskeletal:     Cervical back: Normal range of motion.  Skin:    General: Skin is warm and dry.     Capillary Refill: Capillary refill takes less than 2 seconds.  Neurological:     General: No focal deficit present.     Mental Status: She is alert and oriented to person, place, and time.  Mental status is at baseline.     Gait: Gait normal.  Psychiatric:        Mood and Affect: Mood normal.        Behavior: Behavior normal.        Thought Content: Thought content normal.        Judgment: Judgment normal.       Assessment & Plan:   Asthma-COPD overlap syndrome (HCC) Current smoker Not interested in quitting Feels significant improvement with Breo Ellipta instead of Stiolto Respimat Eosinophil count 700  Plan: Continue Breo Ellipta 200 Lab work today Recommended the patient stop smoking Patient to contact our office if and when she decides to stop smoking   Smoker Current smoker Not interested in quitting smoking Not interested in Chantix 33-pack-year smoking history Established in lung cancer screening program, lung RADS 2  Plan: Emphasized need to stop smoking Patient to contact our office if she becomes interested in stopping smoking Remain in lung cancer screening program    Return in about 2 months (around 03/29/2020), or if symptoms worsen or fail to improve, for Follow up with Dr. Vaughan Browner, Follow up with Dr. Carlis Abbott, Follow up with Dr. Shearon Stalls, [place].   Lauraine Rinne, NP 01/27/2020   This appointment required 32 minutes of patient care (this includes precharting, chart review, review of results, face-to-face care, etc.).

## 2020-01-27 NOTE — Patient Instructions (Addendum)
You were seen today by Lauraine Rinne, NP  for:   1. Asthma-COPD overlap syndrome (HCC)  Labs today  Breo Ellipta 200 >>> Take 1 puff daily in the morning right when you wake up >>>Rinse your mouth out after use >>>This is a daily maintenance inhaler, NOT a rescue inhaler >>>Contact our office if you are having difficulties affording or obtaining this medication >>>It is important for you to be able to take this daily and not miss any doses   Continue daily Zyrtec  Would recommend that you start  Start nasal saline rinses twice daily Use distilled water Shake well Get bottle lukewarm like a baby bottle  Continue Flonase after nasal saline rinses   Follow Up:    Return in about 2 months (around 03/29/2020), or if symptoms worsen or fail to improve, for Follow up with Dr. Vaughan Browner, Follow up with Dr. Carlis Abbott, Follow up with Dr. Shearon Stalls, [place].   Can choose one of the above doctors to establish patient with that she is a former Dr. Tamala Julian patient.  Needs to be in 30-minute slot.  Please do your part to reduce the spread of COVID-19:      Reduce your risk of any infection  and COVID19 by using the similar precautions used for avoiding the common cold or flu:  Marland Kitchen Wash your hands often with soap and warm water for at least 20 seconds.  If soap and water are not readily available, use an alcohol-based hand sanitizer with at least 60% alcohol.  . If coughing or sneezing, cover your mouth and nose by coughing or sneezing into the elbow areas of your shirt or coat, into a tissue or into your sleeve (not your hands). Langley Gauss A MASK when in public  . Avoid shaking hands with others and consider head nods or verbal greetings only. . Avoid touching your eyes, nose, or mouth with unwashed hands.  . Avoid close contact with people who are sick. . Avoid places or events with large numbers of people in one location, like concerts or sporting events. . If you have some symptoms but not all  symptoms, continue to monitor at home and seek medical attention if your symptoms worsen. . If you are having a medical emergency, call 911.   [place] / e-Visit: [email or web address]         [facility]: [facility]: [phone]                   [facility]: [phone]     It is flu season:   >>> Best ways to protect herself from the flu: Receive the yearly flu vaccine, practice good hand hygiene washing with soap and also using hand sanitizer when available, eat a nutritious meals, get adequate rest, hydrate appropriately   Please contact the office if your symptoms worsen or you have concerns that you are not improving.   Thank you for choosing [facility] for your healthcare, and for allowing [place] to partner with you on your healthcare journey. I am thankful to be able to provide care to you today.   Wyn Quaker FNP-C

## 2020-01-27 NOTE — Addendum Note (Signed)
Addended by: Suzzanne Cloud E on: 01/27/2020 03:47 PM   Modules accepted: Orders

## 2020-01-27 NOTE — Assessment & Plan Note (Signed)
Current smoker Not interested in quitting Feels significant improvement with Breo Ellipta instead of Stiolto Respimat Eosinophil count 700  Plan: Continue Breo Ellipta 200 Lab work today Recommended the patient stop smoking Patient to contact our office if and when she decides to stop smoking

## 2020-01-27 NOTE — Assessment & Plan Note (Signed)
Current smoker Not interested in quitting smoking Not interested in Chantix 33-pack-year smoking history Established in lung cancer screening program, lung RADS 2  Plan: Emphasized need to stop smoking Patient to contact our office if she becomes interested in stopping smoking Remain in lung cancer screening program

## 2020-01-27 NOTE — Progress Notes (Signed)
Full PFT performed today. °

## 2020-01-30 LAB — CBC WITH DIFFERENTIAL/PLATELET
Absolute Monocytes: 867 cells/uL (ref 200–950)
Basophils Absolute: 96 cells/uL (ref 0–200)
Basophils Relative: 0.9 %
Eosinophils Absolute: 364 cells/uL (ref 15–500)
Eosinophils Relative: 3.4 %
HCT: 41 % (ref 35.0–45.0)
Hemoglobin: 13.8 g/dL (ref 11.7–15.5)
Lymphs Abs: 4580 cells/uL — ABNORMAL HIGH (ref 850–3900)
MCH: 31.4 pg (ref 27.0–33.0)
MCHC: 33.7 g/dL (ref 32.0–36.0)
MCV: 93.2 fL (ref 80.0–100.0)
MPV: 9.9 fL (ref 7.5–12.5)
Monocytes Relative: 8.1 %
Neutro Abs: 4794 cells/uL (ref 1500–7800)
Neutrophils Relative %: 44.8 %
Platelets: 287 10*3/uL (ref 140–400)
RBC: 4.4 10*6/uL (ref 3.80–5.10)
RDW: 13.1 % (ref 11.0–15.0)
Total Lymphocyte: 42.8 %
WBC: 10.7 10*3/uL (ref 3.8–10.8)

## 2020-01-30 LAB — IGE: IgE (Immunoglobulin E), Serum: 428 kU/L — ABNORMAL HIGH (ref <=114)

## 2020-01-30 NOTE — Telephone Encounter (Signed)
Can sometimes be a nonspecific result.  I think it the best interest would be to repeat a CBC in 1 week.  If continuing to persist may need to discuss with hematology to see if they would like additional work-up.  Okay to place order for repeat CBC with differential.  Wyn Quaker, FNP

## 2020-01-30 NOTE — Telephone Encounter (Signed)
[name],  Please see patient comment and advise.  Thank you.

## 2020-02-01 ENCOUNTER — Other Ambulatory Visit (HOSPITAL_COMMUNITY): Payer: Self-pay | Admitting: Internal Medicine

## 2020-02-01 DIAGNOSIS — R635 Abnormal weight gain: Secondary | ICD-10-CM | POA: Diagnosis not present

## 2020-02-01 DIAGNOSIS — E2839 Other primary ovarian failure: Secondary | ICD-10-CM

## 2020-02-01 DIAGNOSIS — M2550 Pain in unspecified joint: Secondary | ICD-10-CM | POA: Diagnosis not present

## 2020-02-01 DIAGNOSIS — D729 Disorder of white blood cells, unspecified: Secondary | ICD-10-CM | POA: Diagnosis not present

## 2020-02-01 DIAGNOSIS — E6609 Other obesity due to excess calories: Secondary | ICD-10-CM | POA: Diagnosis not present

## 2020-02-01 DIAGNOSIS — D72820 Lymphocytosis (symptomatic): Secondary | ICD-10-CM | POA: Diagnosis not present

## 2020-02-01 DIAGNOSIS — I10 Essential (primary) hypertension: Secondary | ICD-10-CM | POA: Diagnosis not present

## 2020-02-01 DIAGNOSIS — J449 Chronic obstructive pulmonary disease, unspecified: Secondary | ICD-10-CM | POA: Diagnosis not present

## 2020-02-01 DIAGNOSIS — Z6831 Body mass index (BMI) 31.0-31.9, adult: Secondary | ICD-10-CM | POA: Diagnosis not present

## 2020-02-01 DIAGNOSIS — J4520 Mild intermittent asthma, uncomplicated: Secondary | ICD-10-CM | POA: Diagnosis not present

## 2020-02-17 DIAGNOSIS — J449 Chronic obstructive pulmonary disease, unspecified: Secondary | ICD-10-CM | POA: Diagnosis not present

## 2020-02-17 DIAGNOSIS — Z720 Tobacco use: Secondary | ICD-10-CM | POA: Diagnosis not present

## 2020-02-17 DIAGNOSIS — E7849 Other hyperlipidemia: Secondary | ICD-10-CM | POA: Diagnosis not present

## 2020-02-17 DIAGNOSIS — I10 Essential (primary) hypertension: Secondary | ICD-10-CM | POA: Diagnosis not present

## 2020-02-20 ENCOUNTER — Inpatient Hospital Stay (HOSPITAL_COMMUNITY): Admission: RE | Admit: 2020-02-20 | Payer: PPO | Source: Ambulatory Visit

## 2020-02-27 ENCOUNTER — Ambulatory Visit (HOSPITAL_COMMUNITY)
Admission: RE | Admit: 2020-02-27 | Discharge: 2020-02-27 | Disposition: A | Discharge status: Home / Self Care | Payer: PPO | Source: Ambulatory Visit | Attending: Internal Medicine | Admitting: Internal Medicine

## 2020-02-27 ENCOUNTER — Other Ambulatory Visit: Payer: Self-pay

## 2020-02-27 DIAGNOSIS — Z780 Asymptomatic menopausal state: Secondary | ICD-10-CM | POA: Diagnosis not present

## 2020-02-27 DIAGNOSIS — E2839 Other primary ovarian failure: Secondary | ICD-10-CM | POA: Insufficient documentation

## 2020-02-27 DIAGNOSIS — Z720 Tobacco use: Secondary | ICD-10-CM | POA: Diagnosis not present

## 2020-02-27 DIAGNOSIS — Z90710 Acquired absence of both cervix and uterus: Secondary | ICD-10-CM | POA: Diagnosis not present

## 2020-02-27 DIAGNOSIS — M8589 Other specified disorders of bone density and structure, multiple sites: Secondary | ICD-10-CM | POA: Diagnosis not present

## 2020-03-20 DIAGNOSIS — Z720 Tobacco use: Secondary | ICD-10-CM | POA: Diagnosis not present

## 2020-03-20 DIAGNOSIS — I10 Essential (primary) hypertension: Secondary | ICD-10-CM | POA: Diagnosis not present

## 2020-03-20 DIAGNOSIS — E7849 Other hyperlipidemia: Secondary | ICD-10-CM | POA: Diagnosis not present

## 2020-03-20 DIAGNOSIS — J449 Chronic obstructive pulmonary disease, unspecified: Secondary | ICD-10-CM | POA: Diagnosis not present

## 2020-04-02 ENCOUNTER — Ambulatory Visit: Payer: PPO | Admitting: Pulmonary Disease

## 2020-04-02 ENCOUNTER — Other Ambulatory Visit: Payer: Self-pay

## 2020-04-02 ENCOUNTER — Encounter: Payer: Self-pay | Admitting: Pulmonary Disease

## 2020-04-02 VITALS — BP 128/88 | HR 88 | Temp 98.3°F | Ht 66.5 in | Wt 198.4 lb

## 2020-04-02 DIAGNOSIS — J449 Chronic obstructive pulmonary disease, unspecified: Secondary | ICD-10-CM

## 2020-04-02 NOTE — Progress Notes (Signed)
[name]    [id number]    11-19-1955  Primary Care Physician:Fusco, Purcell Nails, MD  Referring Physician: Redmond School, MD [address] [place],  [place] [address]  Chief complaint: Follow-up for asthma  HPI: 64 year old active smoker with COPD, asthma, seasonal allergies Developed dyspnea, cough, hypoxia after receiving the first dose of Moderna vaccine in February.  Treated with supplemental oxygen and steroids.  Reports pneumonia x2 last year and thinks she may had Covid infection at that time.  Seen by my partner Dr. Tamala Julian around May 2021 and diagnosed with asthma, possible allergic reaction to the vaccine.  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  No mold, hot tub, Jacuzzi Smoking history: 23-pack-year smoker.  Continues to smoke half pack per day Travel history: No significant travel history Relevant family history: No significant family issue of lung disease  Outpatient Encounter Medications as of 04/02/2020  Medication Sig  . aspirin 81 MG EC tablet Take 81 mg by mouth every other day.  . B Complex-C (SUPER B COMPLEX PO) Take 1 tablet by mouth daily.  . cetirizine (ZYRTEC ALLERGY) 10 MG tablet Take 1 tablet (10 mg total) by mouth daily.  . Cholecalciferol (VITAMIN D3) 25 MCG (1000 UT) CAPS Take 1 capsule by mouth daily.  Marland Kitchen conjugated estrogens (PREMARIN) vaginal cream Place 1 Applicatorful vaginally every other day. (Patient taking differently: Place 1 Applicatorful vaginally once a week. )  . Cranberry 1000 MG CAPS Take 1 capsule by mouth daily.  . famotidine (PEPCID) 20 MG tablet Take 1 tablet (20 mg total) by mouth at bedtime.  . fluticasone furoate-vilanterol (BREO ELLIPTA) 200-25 MCG/INH AEPB Inhale 1 puff into the lungs  daily.  . Garlic 7425 MG CAPS Take 1 capsule by mouth daily.  . Multiple Vitamins-Minerals (HAIR SKIN AND NAILS FORMULA) TABS Take 1 tablet by mouth daily.   . Multiple Vitamins-Minerals (MULTIVITAMIN ADULTS PO) Take 1 tablet by mouth daily.  . Omega-3 Fatty Acids (FISH OIL) 1000 MG CAPS Take 1 capsule by mouth daily.   No facility-administered encounter medications on file as of 04/02/2020.    Allergies as of 04/02/2020 - Review Complete 04/02/2020  Allergen Reaction Noted  . Bee venom Anaphylaxis 01/30/2017  . Codeine Nausea And Vomiting 03/15/2016  . Other Nausea And Vomiting 01/30/2017  . Propofol Nausea And Vomiting 12/19/1964    Past Medical History:  Diagnosis Date  . Asthma   . Diverticulosis   . Hypercholesteremia   . Pneumonia 12/2018   double pneumonia  . PONV (postoperative nausea and vomiting)   . Seasonal allergies   . Shingles   . Varicose veins     Past Surgical History:  Procedure Laterality Date  . ABDOMINAL HYSTERECTOMY     partial  . BACK SURGERY     cervical surgery  . COLONOSCOPY N/A 02/04/2017   Procedure: COLONOSCOPY;  Surgeon: Rogene Houston, MD;  [facility];  Service: Endoscopy;  Laterality: N/A;  930  . ENDOSCOPIC CONCHA BULLOSA RESECTION Bilateral 03/21/2019   Procedure: ENDOSCOPIC CONCHA BULLOSA RESECTION;  Surgeon: Leta Baptist, MD;  Location: [facility];  Service: ENT;  Laterality: Bilateral;  . NASAL SEPTOPLASTY W/ TURBINOPLASTY Bilateral 03/21/2019   Procedure: NASAL SEPTOPLASTY WITH BILATERAL TURBINATE REDUCTION;  Surgeon: Benjamine Mola,  Su, MD;  Location: [facility];  Service: ENT;  Laterality: Bilateral;  . TONSILLECTOMY    . WRIST SURGERY     age 16yrs    Family History  Problem Relation Age of Onset  . Cancer Mother        breast  . Heart attack Mother   . Acute myelogenous leukemia Mother   . Gallbladder disease Father   . Diabetes Brother   . Thyroid cancer Maternal Aunt   . Brain cancer  Maternal Aunt   . Bone cancer Paternal Aunt   . Diabetes Maternal Grandmother   . Dementia Maternal Grandmother   . Leukemia Maternal Grandfather   . Bone cancer Paternal Grandmother   . Breast cancer Maternal Aunt   . Lung cancer Maternal Aunt   . Skin cancer Brother   . Diabetes Brother   . Colon cancer Neg Hx     Social History   Socioeconomic History  . Marital status: Widowed    Spouse name: Not on file  . Number of children: 0  . Years of education: Not on file  . Highest education level: Not on file  Occupational History  . Not on file  Tobacco Use  . Smoking status: Current Every Day Smoker    Packs/day: 0.50    Years: 45.00    Pack years: 22.50    Types: Cigarettes  . Smokeless tobacco: Never Used  . Tobacco comment: Averaging 4 cigarettes daily  Vaping Use  . Vaping Use: Former  Substance and Sexual Activity  . Alcohol use: Yes    Alcohol/week: 0.0 standard drinks    Comment: occ  . Drug use: No  . Sexual activity: Not Currently    Birth control/protection: Post-menopausal  Other Topics Concern  . Not on file  Social History Narrative  . Not on file   Social Determinants of Health   Financial Resource Strain:   . Difficulty of Paying Living Expenses: Not on file  Food Insecurity:   . Worried About Charity fundraiser in the Last Year: Not on file  . Ran Out of Food in the Last Year: Not on file  Transportation Needs:   . Lack of Transportation (Medical): Not on file  . Lack of Transportation (Non-Medical): Not on file  Physical Activity:   . Days of Exercise per Week: Not on file  . Minutes of Exercise per Session: Not on file  Stress:   . Feeling of Stress : Not on file  Social Connections:   . Frequency of Communication with Friends and Family: Not on file  . Frequency of Social Gatherings with Friends and Family: Not on file  . Attends Religious Services: Not on file  . Active Member of Clubs or Organizations: Not on file  . Attends English as a second language teacher Meetings: Not on file  . Marital Status: Not on file  Intimate Partner Violence:   . Fear of Current or Ex-Partner: Not on file  . Emotionally Abused: Not on file  . Physically Abused: Not on file  . Sexually Abused: Not on file    Review of systems: Review of Systems  Constitutional: Negative for fever and chills.  HENT: Negative.   Eyes: Negative for blurred vision.  Respiratory: as per HPI  Cardiovascular: Negative for chest pain and palpitations.  Gastrointestinal: Negative for vomiting, diarrhea, blood per rectum. Genitourinary: Negative for dysuria, urgency, frequency and hematuria.  Musculoskeletal: Negative for myalgias, back pain and joint pain.  Skin:  Negative for itching and rash.  Neurological: Negative for dizziness, tremors, focal weakness, seizures and loss of consciousness.  Endo/Heme/Allergies: Negative for environmental allergies.  Psychiatric/Behavioral: Negative for depression, suicidal ideas and hallucinations.  All other systems reviewed and are negative.  Physical Exam: Blood pressure 128/88, pulse 88, temperature 98.3 F (36.8 C), temperature source Oral, height 5' 6.5" (1.689 m), weight 198 lb 6.4 oz (90 kg), SpO2 97 %. Gen:      No acute distress HEENT:  EOMI, sclera anicteric Neck:     No masses; no thyromegaly Lungs:    Clear to auscultation bilaterally; normal respiratory effort CV:         Regular rate and rhythm; no murmurs Abd:      + bowel sounds; soft, non-tender; no palpable masses, no distension Ext:    No edema; adequate peripheral perfusion Skin:      Warm and dry; no rash Neuro: alert and oriented x 3 Psych: normal mood and affect  Data Reviewed: Imaging: CT chest 01/16/2020-centrilobular emphysema, subcentimeter pulmonary nodule. I have reviewed the images personally  PFTs: 01/27/2020 FVC 2.98 [88%], FEV1 2.30 [88%], F/F 77, TLC 5.47 [102+], DLCO 20.17 [95%] Normal test  Labs: CBC 01/27/2020-WBC 10.7, eos 2.4%,  absolute eosinophil count 364 IgE 01/27/2020-428  Assessment:  Asthma-COPD overlap syndrome Continues on Breo inhaler. She feels that the steroid inhaler is causing weight gain Since his symptoms are stable with normal PFTs we will give her a trial of the [facility] and see how she does If symptoms should recur then resume medication.  Reaction to Moderna vaccine She is wary of getting another shot Apparently had high IgG levels to COVID at her primary care around May-June 2021 She plans on getting repeat tested.  If low then consider the Pfizer vaccine  Plan/Recommendations: Trial off Breo and see how she does  Marshell Garfinkel MD  Pulmonary and Critical Care 04/02/2020, 2:29 PM  CC: Redmond School, MD

## 2020-04-02 NOTE — Patient Instructions (Signed)
Am glad you are doing well with regard to your breathing I have reviewed your lung function test which shows normal lung function We will give a trial of Breo and see how you do If your symptoms recur then will resume the medication Follow-up in 3 months

## 2020-04-03 ENCOUNTER — Other Ambulatory Visit: Payer: Self-pay | Admitting: Pulmonary Disease

## 2020-04-03 MED ORDER — BREO ELLIPTA 200-25 MCG/INH IN AEPB: 1.0000 | INHALATION_SPRAY | Freq: Every day | RESPIRATORY_TRACT | 0 refills | Status: DC

## 2020-04-06 DIAGNOSIS — B349 Viral infection, unspecified: Secondary | ICD-10-CM | POA: Diagnosis not present

## 2020-04-19 DIAGNOSIS — J310 Chronic rhinitis: Secondary | ICD-10-CM | POA: Diagnosis not present

## 2020-04-19 DIAGNOSIS — R0982 Postnasal drip: Secondary | ICD-10-CM | POA: Diagnosis not present

## 2020-04-19 DIAGNOSIS — J343 Hypertrophy of nasal turbinates: Secondary | ICD-10-CM | POA: Diagnosis not present

## 2020-05-19 DIAGNOSIS — Z720 Tobacco use: Secondary | ICD-10-CM | POA: Diagnosis not present

## 2020-05-19 DIAGNOSIS — I10 Essential (primary) hypertension: Secondary | ICD-10-CM | POA: Diagnosis not present

## 2020-05-19 DIAGNOSIS — E7849 Other hyperlipidemia: Secondary | ICD-10-CM | POA: Diagnosis not present

## 2020-05-19 DIAGNOSIS — J449 Chronic obstructive pulmonary disease, unspecified: Secondary | ICD-10-CM | POA: Diagnosis not present

## 2020-06-04 ENCOUNTER — Telehealth (HOSPITAL_COMMUNITY): Payer: Self-pay | Admitting: Hematology

## 2020-06-06 ENCOUNTER — Other Ambulatory Visit: Payer: Self-pay | Admitting: Pulmonary Disease

## 2020-06-20 ENCOUNTER — Inpatient Hospital Stay (HOSPITAL_COMMUNITY): Payer: PPO | Attending: Hematology

## 2020-06-20 ENCOUNTER — Other Ambulatory Visit: Payer: Self-pay

## 2020-06-20 DIAGNOSIS — D72829 Elevated white blood cell count, unspecified: Secondary | ICD-10-CM | POA: Diagnosis not present

## 2020-06-20 DIAGNOSIS — J45909 Unspecified asthma, uncomplicated: Secondary | ICD-10-CM | POA: Insufficient documentation

## 2020-06-20 DIAGNOSIS — Z87891 Personal history of nicotine dependence: Secondary | ICD-10-CM | POA: Diagnosis not present

## 2020-06-20 DIAGNOSIS — Z23 Encounter for immunization: Secondary | ICD-10-CM | POA: Diagnosis not present

## 2020-06-20 DIAGNOSIS — Z79899 Other long term (current) drug therapy: Secondary | ICD-10-CM | POA: Diagnosis not present

## 2020-06-20 LAB — CBC WITH DIFFERENTIAL/PLATELET
Abs Immature Granulocytes: 0.05 10*3/uL (ref 0.00–0.07)
Basophils Absolute: 0.1 10*3/uL (ref 0.0–0.1)
Basophils Relative: 1 %
Eosinophils Absolute: 0.2 10*3/uL (ref 0.0–0.5)
Eosinophils Relative: 2 %
HCT: 39.2 % (ref 36.0–46.0)
Hemoglobin: 12.9 g/dL (ref 12.0–15.0)
Immature Granulocytes: 0 %
Lymphocytes Relative: 31 %
Lymphs Abs: 3.6 10*3/uL (ref 0.7–4.0)
MCH: 31.5 pg (ref 26.0–34.0)
MCHC: 32.9 g/dL (ref 30.0–36.0)
MCV: 95.8 fL (ref 80.0–100.0)
Monocytes Absolute: 0.8 10*3/uL (ref 0.1–1.0)
Monocytes Relative: 7 %
Neutro Abs: 7.1 10*3/uL (ref 1.7–7.7)
Neutrophils Relative %: 59 %
Platelets: 255 10*3/uL (ref 150–400)
RBC: 4.09 MIL/uL (ref 3.87–5.11)
RDW: 12.6 % (ref 11.5–15.5)
WBC: 11.8 10*3/uL — ABNORMAL HIGH (ref 4.0–10.5)
nRBC: 0 % (ref 0.0–0.2)

## 2020-06-20 LAB — LACTATE DEHYDROGENASE: LDH: 168 U/L (ref 98–192)

## 2020-06-22 ENCOUNTER — Encounter (HOSPITAL_COMMUNITY): Payer: Self-pay

## 2020-06-27 ENCOUNTER — Other Ambulatory Visit: Payer: Self-pay

## 2020-06-27 ENCOUNTER — Other Ambulatory Visit (HOSPITAL_COMMUNITY): Payer: Self-pay | Admitting: *Deleted

## 2020-06-27 ENCOUNTER — Encounter (HOSPITAL_COMMUNITY): Payer: Self-pay | Admitting: Hematology and Oncology

## 2020-06-27 ENCOUNTER — Inpatient Hospital Stay (HOSPITAL_BASED_OUTPATIENT_CLINIC_OR_DEPARTMENT_OTHER): Payer: PPO | Admitting: Hematology and Oncology

## 2020-06-27 VITALS — BP 113/66 | HR 72 | Temp 97.1°F | Resp 18 | Wt 204.1 lb

## 2020-06-27 DIAGNOSIS — D72829 Elevated white blood cell count, unspecified: Secondary | ICD-10-CM

## 2020-06-27 DIAGNOSIS — Z23 Encounter for immunization: Secondary | ICD-10-CM | POA: Diagnosis not present

## 2020-06-27 NOTE — Progress Notes (Signed)
[place], [place] [address]   CLINIC:  Medical Oncology/Hematology  PCP:  Redmond School, [place] [address] [place] [address]  [phone]  REASON FOR VISIT:  Follow-up for leukocytosis  PRIOR THERAPY: None  CURRENT THERAPY: Observation.  INTERVAL HISTORY:   [name], a 64 y.o. female, returns for routine follow-up for her leukocytosis.  She is doing well. NO B symptoms.  No recent infections. She wonders if her WBC count is slightly increased because of inhaled steroids. She has also been gaining weight. No change in breathing No change in bowel or urinary habits. Up to date with cancer screening  REVIEW OF SYSTEMS:  Review of Systems  Constitutional: Positive for appetite change (mildly decreased). Negative for fatigue.  Respiratory: Negative for cough.   All other systems reviewed and are negative.   PAST MEDICAL/SURGICAL HISTORY:  Past Medical History:  Diagnosis Date  . Asthma   . Diverticulosis   . Hypercholesteremia   . Pneumonia 12/2018   double pneumonia  . PONV (postoperative nausea and vomiting)   . Seasonal allergies   . Shingles   . Varicose veins    Past Surgical History:  Procedure Laterality Date  . ABDOMINAL HYSTERECTOMY     partial  . BACK SURGERY     cervical surgery  . COLONOSCOPY N/A 02/04/2017   Procedure: COLONOSCOPY;  Surgeon: Rogene Houston, MD;  [facility];  Service: Endoscopy;  Laterality: N/A;  930  . ENDOSCOPIC CONCHA BULLOSA RESECTION Bilateral 03/21/2019   Procedure: ENDOSCOPIC CONCHA BULLOSA RESECTION;  Surgeon: Leta Baptist, MD;  Location: [facility];  Service: ENT;  Laterality: Bilateral;  . NASAL SEPTOPLASTY W/ TURBINOPLASTY Bilateral 03/21/2019   Procedure: NASAL SEPTOPLASTY WITH BILATERAL TURBINATE REDUCTION;  Surgeon: Leta Baptist, MD;  Location: [facility];  Service: ENT;  Laterality: Bilateral;  . TONSILLECTOMY    .  WRIST SURGERY     age 63yr    SOCIAL HISTORY:  Social History   Socioeconomic History  . Marital status: Widowed    Spouse name: Not on file  . Number of children: 0  . Years of education: Not on file  . Highest education level: Not on file  Occupational History  . Not on file  Tobacco Use  . Smoking status: Current Every Day Smoker    Packs/day: 0.50    Years: 45.00    Pack years: 22.50    Types: Cigarettes  . Smokeless tobacco: Never Used  . Tobacco comment: Averaging 4 cigarettes daily  Vaping Use  . Vaping Use: Former  Substance and Sexual Activity  . Alcohol use: Yes    Alcohol/week: 0.0 standard drinks    Comment: occ  . Drug use: No  . Sexual activity: Not Currently    Birth control/protection: Post-menopausal  Other Topics Concern  . Not on file  Social History Narrative  . Not on file   Social Determinants of Health   Financial Resource Strain: Low Risk   . Difficulty of Paying Living Expenses: Not hard at all  Food Insecurity: No Food Insecurity  . Worried About RCharity fundraiserin the Last Year: Never true  . Ran Out of Food in the Last Year: Never true  Transportation Needs: No Transportation Needs  . Lack of Transportation (Medical): No  . Lack of Transportation (Non-Medical): No  Physical Activity: Inactive  . Days of Exercise per Week: 0 days  .  Minutes of Exercise per Session: 0 min  Stress: No Stress Concern Present  . Feeling of Stress : Not at all  Social Connections: Moderately Integrated  . Frequency of Communication with Friends and Family: More than three times a week  . Frequency of Social Gatherings with Friends and Family: Three times a week  . Attends Religious Services: 1 to 4 times per year  . Active Member of Clubs or Organizations: No  . Attends Archivist Meetings: Never  . Marital Status: Married  Human resources officer Violence: Not At Risk  . Fear of Current or Ex-Partner: No  . Emotionally Abused: No  .  Physically Abused: No  . Sexually Abused: No    FAMILY HISTORY:  Family History  Problem Relation Age of Onset  . Cancer Mother        breast  . Heart attack Mother   . Acute myelogenous leukemia Mother   . Gallbladder disease Father   . Diabetes Brother   . Thyroid cancer Maternal Aunt   . Brain cancer Maternal Aunt   . Bone cancer Paternal Aunt   . Diabetes Maternal Grandmother   . Dementia Maternal Grandmother   . Leukemia Maternal Grandfather   . Bone cancer Paternal Grandmother   . Breast cancer Maternal Aunt   . Lung cancer Maternal Aunt   . Skin cancer Brother   . Diabetes Brother   . Colon cancer Neg Hx     CURRENT MEDICATIONS:  Current Outpatient Medications  Medication Sig Dispense Refill  . aspirin 81 MG EC tablet Take 81 mg by mouth every other day.    . B Complex-C (SUPER B COMPLEX PO) Take 1 tablet by mouth daily.    Marland Kitchen BREO ELLIPTA 200-25 MCG/INH AEPB INHALE 1 PUFF INTO THE LUNGS ONCE DAILY. 60 each 3  . cetirizine (ZYRTEC ALLERGY) 10 MG tablet Take 1 tablet (10 mg total) by mouth daily. 90 tablet 3  . Cholecalciferol (VITAMIN D3) 25 MCG (1000 UT) CAPS Take 1 capsule by mouth daily.    Marland Kitchen conjugated estrogens (PREMARIN) vaginal cream Place 1 Applicatorful vaginally every other day. (Patient taking differently: Place 1 Applicatorful vaginally once a week. ) 42.5 g 5  . Cranberry 1000 MG CAPS Take 1 capsule by mouth daily.    . famotidine (PEPCID) 20 MG tablet Take 1 tablet (20 mg total) by mouth at bedtime. 90 tablet 3  . Garlic 9381 MG CAPS Take 1 capsule by mouth daily.    . Multiple Vitamins-Minerals (HAIR SKIN AND NAILS FORMULA) TABS Take 1 tablet by mouth daily.     . Multiple Vitamins-Minerals (MULTIVITAMIN ADULTS PO) Take 1 tablet by mouth daily.    . Omega-3 Fatty Acids (FISH OIL) 1000 MG CAPS Take 1 capsule by mouth daily.     No current facility-administered medications for this visit.    ALLERGIES:  Allergies  Allergen Reactions  . Bee Venom  Anaphylaxis  . Codeine Nausea And Vomiting  . Other Nausea And Vomiting    General anesthesia   . Propofol Nausea And Vomiting    PHYSICAL EXAM:  Performance status (ECOG): 0 - Asymptomatic  Vitals:   06/27/20 1400  BP: 113/66  Pulse: 72  Resp: 18  Temp: (!) 97.1 F (36.2 C)  SpO2: 97%   Wt Readings from Last 3 Encounters:  06/27/20 204 lb 1.6 oz (92.6 kg)  04/02/20 198 lb 6.4 oz (90 kg)  01/27/20 193 lb (87.5 kg)   Physical Exam Vitals reviewed.  Constitutional:      Appearance: Normal appearance.  Cardiovascular:     Rate and Rhythm: Normal rate and regular rhythm.     Pulses: Normal pulses.     Heart sounds: Normal heart sounds.  Pulmonary:     Effort: Pulmonary effort is normal.     Breath sounds: Normal breath sounds.  Neurological:     General: No focal deficit present.     Mental Status: She is alert and oriented to person, place, and time.  Psychiatric:        Mood and Affect: Mood normal.        Behavior: Behavior normal.     LABORATORY DATA:  I have reviewed the labs as listed.  CBC Latest Ref Rng & Units 06/20/2020 01/27/2020 12/13/2019  WBC 4.0 - 10.5 K/uL 11.8(H) 10.7 9.5  Hemoglobin 12.0 - 15.0 g/dL 12.9 13.8 12.4  Hematocrit 36 - 46 % 39.2 41.0 38.0  Platelets 150 - 400 K/uL 255 287 218   CMP Latest Ref Rng & Units 11/26/2019 03/15/2016  Glucose 70 - 99 mg/dL 94 97  BUN 8 - 23 mg/dL 18 22(H)  Creatinine 0.44 - 1.00 mg/dL 0.70 1.00  Sodium 135 - 145 mmol/L 138 137  Potassium 3.5 - 5.1 mmol/L 4.4 4.2  Chloride 98 - 111 mmol/L 103 103  CO2 22 - 32 mmol/L 28 24  Calcium 8.9 - 10.3 mg/dL 8.9 9.7  Total Protein 6.5 - 8.1 g/dL 7.0 7.2  Total Bilirubin 0.3 - 1.2 mg/dL 0.7 0.6  Alkaline Phos 38 - 126 U/L 77 70  AST 15 - 41 U/L 25 24  ALT 0 - 44 U/L 21 18      Component Value Date/Time   RBC 4.09 06/20/2020 1404   MCV 95.8 06/20/2020 1404   MCH 31.5 06/20/2020 1404   MCHC 32.9 06/20/2020 1404   RDW 12.6 06/20/2020 1404   LYMPHSABS 3.6 06/20/2020  1404   MONOABS 0.8 06/20/2020 1404   EOSABS 0.2 06/20/2020 1404   BASOSABS 0.1 06/20/2020 1404    DIAGNOSTIC IMAGING:  I have independently reviewed the scans and discussed with the patient. No results found.   ASSESSMENT:   1.  Leukocytosis: -Patient seen at the request of Dr. Gerarda Fraction as CBC on 10/24/2019 showed white count 14.5 with normal hemoglobin and platelets. -She reported that several of her prior CBCs have shown elevated white counts. -JAK2 V617F and reflex testing, BCR/ABL was negative.  Flow cytometry was negative.  2.  Family history: -Paternal grandmother had and paternal aunt had "bone cancer". -Maternal grandfather had leukemia.  Mother had breast cancer but died of acute myeloid leukemia.  Maternal aunt had metastatic cancer.  Another maternal aunt and maternal cousin died of metastatic breast cancer.   PLAN:  1.  Leukocytosis: -We reviewed results from 06/2020.  CBC with mild leukocytosis, Chronic myeloproliferative disorder testing was negative. -No further work-up needed.  If there is any significant changes, will consider bone marrow biopsy.   We will follow her in 1 yr with repeat labs.  2.  Asthma: -She is on Breo Ellipta and Pepcid. -She quit smoking about 5 weeks ago.  Smoked half pack per day for 40 years. -Lung cancer screening , Lung RADS 2.  3.  Health maintenance: -Mammogram on 12/22/2019 was BI-RADS Category 1.   Orders placed this encounter:  No orders of the defined types were placed in this encounter.  I spent 20 minutes in the care of this patient including H  and P, review of records, counseling and coordination of care.  Benay Pike MD

## 2020-07-03 ENCOUNTER — Ambulatory Visit (INDEPENDENT_AMBULATORY_CARE_PROVIDER_SITE_OTHER): Payer: PPO | Admitting: Pulmonary Disease

## 2020-07-03 ENCOUNTER — Other Ambulatory Visit: Payer: Self-pay

## 2020-07-03 DIAGNOSIS — J449 Chronic obstructive pulmonary disease, unspecified: Secondary | ICD-10-CM | POA: Diagnosis not present

## 2020-07-03 DIAGNOSIS — F17200 Nicotine dependence, unspecified, uncomplicated: Secondary | ICD-10-CM | POA: Diagnosis not present

## 2020-07-03 NOTE — Progress Notes (Signed)
[name]    [id number]    02-Oct-1955  Primary Care Physician:Fusco, Purcell Nails, MD  Referring Physician: Redmond School, [place],  [place] [address]   Virtual Visit via Telephone Note  I connected with [name] on 07/03/20 at  1:45 PM EST by telephone and verified that I am speaking with the correct person using two identifiers.  Location: Patient: Home Provider: [facility], [place], [place]   I discussed the limitations, risks, security and privacy concerns of performing an evaluation and management service by telephone and the availability of in person appointments. I also discussed with the patient that there may be a patient responsible charge related to this service. The patient expressed understanding and agreed to proceed.  Chief complaint: Follow-up for asthma  HPI: 64 year old active smoker with COPD, asthma, seasonal allergies Developed dyspnea, cough, hypoxia after receiving the first dose of Moderna vaccine in February.  Treated with supplemental oxygen and steroids.  Reports pneumonia x2 last year and thinks she may had Covid infection at that time.  Seen by my partner Dr. Tamala Julian around May 2021 and diagnosed with asthma, possible allergic reaction to the vaccine.  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  No mold, hot tub, Jacuzzi Smoking history: 23-pack-year smoker.  Continues to smoke half pack per day Travel history: No significant travel history Relevant family history: No significant family issue of lung disease.  Interim History: Continues on the Breo inhaler.  States that breathing is doing well with no issues Continues to smoke cigarettes.  Outpatient Encounter  Medications as of 07/03/2020  Medication Sig  . aspirin 81 MG EC tablet Take 81 mg by mouth every other day.  . B Complex-C (SUPER B COMPLEX PO) Take 1 tablet by mouth daily.  Marland Kitchen BREO ELLIPTA 200-25 MCG/INH AEPB INHALE 1 PUFF INTO THE LUNGS ONCE DAILY.  . cetirizine (ZYRTEC ALLERGY) 10 MG tablet Take 1 tablet (10 mg total) by mouth daily.  . Cholecalciferol (VITAMIN D3) 25 MCG (1000 UT) CAPS Take 1 capsule by mouth daily.  Marland Kitchen conjugated estrogens (PREMARIN) vaginal cream Place 1 Applicatorful vaginally every other day. (Patient taking differently: Place 1 Applicatorful vaginally once a week. )  . Cranberry 1000 MG CAPS Take 1 capsule by mouth daily.  . famotidine (PEPCID) 20 MG tablet Take 1 tablet (20 mg total) by mouth at bedtime.  . Garlic 1914 MG CAPS Take 1 capsule by mouth daily.  . Multiple Vitamins-Minerals (HAIR SKIN AND NAILS FORMULA) TABS Take 1 tablet by mouth daily.   . Multiple Vitamins-Minerals (MULTIVITAMIN ADULTS PO) Take 1 tablet by mouth daily.  . Omega-3 Fatty Acids (FISH OIL) 1000 MG CAPS Take 1 capsule by mouth daily.   No facility-administered encounter medications on file as of 07/03/2020.   Physical Exam: There were no vitals taken for this visit. Gen:      No acute distress HEENT:  EOMI, sclera anicteric Neck:     No masses; no thyromegaly Lungs:    Clear to auscultation bilaterally; normal respiratory effort CV:         Regular rate and rhythm; no murmurs Abd:      + bowel sounds; soft, non-tender; no palpable masses, no distension Ext:  No edema; adequate peripheral perfusion Skin:      Warm and dry; no rash Neuro: alert and oriented x 3 Psych: normal mood and affect  Data Reviewed: Imaging: CT chest 01/16/2020-centrilobular emphysema, subcentimeter pulmonary nodule. I have reviewed the images personally  PFTs: 01/27/2020 FVC 2.98 [88%], FEV1 2.30 [88%], F/F 77, TLC 5.47 [102+], DLCO 20.17 [95%] Normal test  Labs: CBC 01/27/2020-WBC 10.7, eos 2.4%,  absolute eosinophil count 364 IgE 01/27/2020-428  Assessment:  Asthma-COPD overlap syndrome Continues on Breo inhaler. PFTs are normal.  We had discussed getting her off inhalers but she would like to continue as she feels it is helping  Reaction to Moderna vaccine She is wary of getting another shot Apparently had high IgG levels to COVID.  Pulmonary nodules Continue annual screening CT chest.  Plan/Recommendations: Continue Breo Follow-up in 1 year.  I discussed the assessment and treatment plan with the patient. The patient was provided an opportunity to ask questions and all were answered. The patient agreed with the plan and demonstrated an understanding of the instructions.   The patient was advised to call back or seek an in-person evaluation if the symptoms worsen or if the condition fails to improve as anticipated.  I provided 25 minutes of non-face-to-face time during this encounter.  Marshell Garfinkel MD [facility] 07/03/2020, 1:46 PM  CC: Redmond School, MD

## 2020-07-03 NOTE — Patient Instructions (Signed)
Continue the breo Follow-up in 1 year.

## 2020-07-19 DIAGNOSIS — Z681 Body mass index (BMI) 19 or less, adult: Secondary | ICD-10-CM | POA: Diagnosis not present

## 2020-07-19 DIAGNOSIS — J019 Acute sinusitis, unspecified: Secondary | ICD-10-CM | POA: Diagnosis not present

## 2020-07-20 DIAGNOSIS — I10 Essential (primary) hypertension: Secondary | ICD-10-CM | POA: Diagnosis not present

## 2020-07-20 DIAGNOSIS — E7849 Other hyperlipidemia: Secondary | ICD-10-CM | POA: Diagnosis not present

## 2020-07-20 DIAGNOSIS — J449 Chronic obstructive pulmonary disease, unspecified: Secondary | ICD-10-CM | POA: Diagnosis not present

## 2020-07-20 DIAGNOSIS — Z720 Tobacco use: Secondary | ICD-10-CM | POA: Diagnosis not present

## 2020-08-18 DIAGNOSIS — I10 Essential (primary) hypertension: Secondary | ICD-10-CM | POA: Diagnosis not present

## 2020-08-18 DIAGNOSIS — J449 Chronic obstructive pulmonary disease, unspecified: Secondary | ICD-10-CM | POA: Diagnosis not present

## 2020-08-18 DIAGNOSIS — E7849 Other hyperlipidemia: Secondary | ICD-10-CM | POA: Diagnosis not present

## 2020-08-18 DIAGNOSIS — Z720 Tobacco use: Secondary | ICD-10-CM | POA: Diagnosis not present

## 2020-10-12 ENCOUNTER — Other Ambulatory Visit: Payer: Self-pay | Admitting: Pulmonary Disease

## 2020-11-17 DIAGNOSIS — Z720 Tobacco use: Secondary | ICD-10-CM | POA: Diagnosis not present

## 2020-11-17 DIAGNOSIS — E7849 Other hyperlipidemia: Secondary | ICD-10-CM | POA: Diagnosis not present

## 2020-11-17 DIAGNOSIS — I10 Essential (primary) hypertension: Secondary | ICD-10-CM | POA: Diagnosis not present

## 2020-11-17 DIAGNOSIS — J449 Chronic obstructive pulmonary disease, unspecified: Secondary | ICD-10-CM | POA: Diagnosis not present

## 2020-11-20 DIAGNOSIS — Z6832 Body mass index (BMI) 32.0-32.9, adult: Secondary | ICD-10-CM | POA: Diagnosis not present

## 2020-11-20 DIAGNOSIS — Z1389 Encounter for screening for other disorder: Secondary | ICD-10-CM | POA: Diagnosis not present

## 2020-11-20 DIAGNOSIS — E7849 Other hyperlipidemia: Secondary | ICD-10-CM | POA: Diagnosis not present

## 2020-11-20 DIAGNOSIS — E6609 Other obesity due to excess calories: Secondary | ICD-10-CM | POA: Diagnosis not present

## 2020-11-20 DIAGNOSIS — Z0000 Encounter for general adult medical examination without abnormal findings: Secondary | ICD-10-CM | POA: Diagnosis not present

## 2020-11-20 DIAGNOSIS — Z1331 Encounter for screening for depression: Secondary | ICD-10-CM | POA: Diagnosis not present

## 2020-12-18 ENCOUNTER — Telehealth: Payer: Self-pay | Admitting: Pulmonary Disease

## 2020-12-18 MED ORDER — CLOTRIMAZOLE 10 MG MT TROC
10.0000 mg | Freq: Every day | OROMUCOSAL | 0 refills | Status: DC
Start: 2020-12-18 — End: 2021-07-26

## 2020-12-18 NOTE — Telephone Encounter (Signed)
Fine to stop Breo for a few days.  Does she want me to call her in something for thrush Would resume Breo after a few days.  Make sure that she brush rinse and gargles.  May use a little bit of peroxide and water to rinse her mouth out twice a week.  Eat yogurt daily.

## 2020-12-18 NOTE — Telephone Encounter (Signed)
Thrush started on Friday evening.  She stated that she kept using her Breo.  She stated that she has blisters on the inside of her cheeks, the back of her throat and she stated that she cannot eat, and drinking is very painful for her.  She is requesting that something be called in for this.    She also wanted to know if she should hold off on using the Breo until this is cleared up.   PM is off.  TP please advise. Thanks

## 2020-12-18 NOTE — Telephone Encounter (Signed)
Spoke with pt and advised or recommendations per Tammy Parrett, Np. Rx sent to pharmacy. Pt verbalized understanding. Nothing further needed.

## 2020-12-18 NOTE — Telephone Encounter (Signed)
Pt would like to have something called in for thrush.  thanks

## 2020-12-18 NOTE — Telephone Encounter (Signed)
Mycelex troche #35  1 5 times daily for 1 week.  No refills.

## 2021-01-17 DIAGNOSIS — I10 Essential (primary) hypertension: Secondary | ICD-10-CM | POA: Diagnosis not present

## 2021-01-17 DIAGNOSIS — J449 Chronic obstructive pulmonary disease, unspecified: Secondary | ICD-10-CM | POA: Diagnosis not present

## 2021-01-17 DIAGNOSIS — E782 Mixed hyperlipidemia: Secondary | ICD-10-CM | POA: Diagnosis not present

## 2021-01-24 ENCOUNTER — Other Ambulatory Visit: Payer: Self-pay | Admitting: *Deleted

## 2021-01-24 DIAGNOSIS — Z87891 Personal history of nicotine dependence: Secondary | ICD-10-CM

## 2021-02-15 ENCOUNTER — Ambulatory Visit (HOSPITAL_COMMUNITY)
Admission: RE | Admit: 2021-02-15 | Discharge: 2021-02-15 | Disposition: A | Discharge status: Home / Self Care | Payer: PPO | Source: Ambulatory Visit | Attending: Acute Care | Admitting: Acute Care

## 2021-02-15 ENCOUNTER — Other Ambulatory Visit: Payer: Self-pay

## 2021-02-15 DIAGNOSIS — J439 Emphysema, unspecified: Secondary | ICD-10-CM | POA: Diagnosis not present

## 2021-02-15 DIAGNOSIS — Z87891 Personal history of nicotine dependence: Secondary | ICD-10-CM | POA: Insufficient documentation

## 2021-02-15 DIAGNOSIS — Z122 Encounter for screening for malignant neoplasm of respiratory organs: Secondary | ICD-10-CM | POA: Diagnosis not present

## 2021-02-18 ENCOUNTER — Encounter: Payer: Self-pay | Admitting: *Deleted

## 2021-02-18 ENCOUNTER — Telehealth: Payer: Self-pay | Admitting: Acute Care

## 2021-02-18 DIAGNOSIS — Z87891 Personal history of nicotine dependence: Secondary | ICD-10-CM

## 2021-02-18 DIAGNOSIS — F17200 Nicotine dependence, unspecified, uncomplicated: Secondary | ICD-10-CM

## 2021-02-18 NOTE — Telephone Encounter (Signed)
CT result letter sent to pt via Mychart. Copy of CT faxed to PCP. Order placed for 1 yr f/u low dose ct.   

## 2021-02-28 NOTE — Progress Notes (Signed)
Please call patient and let them  know their  low dose Ct was read as a Lung RADS 2: nodules that are benign in appearance and behavior with a very low likelihood of becoming a clinically active cancer due to size or lack of growth. Recommendation per radiology is for a repeat LDCT in 12 months. .Please let them  know we will order and schedule their  annual screening scan for 01/2022. Pt.  is not  currently on statin therapy. Please place order for annual  screening scan for  01/2022 and fax results to PCP. Thanks so much.

## 2021-03-01 ENCOUNTER — Other Ambulatory Visit: Payer: Self-pay

## 2021-03-01 DIAGNOSIS — N3021 Other chronic cystitis with hematuria: Secondary | ICD-10-CM

## 2021-03-01 MED ORDER — ESTROGENS, CONJUGATED 0.625 MG/GM VA CREA: 1.0000 | TOPICAL_CREAM | VAGINAL | 2 refills | Status: DC

## 2021-03-04 ENCOUNTER — Other Ambulatory Visit: Payer: Self-pay

## 2021-03-04 MED ORDER — ESTRADIOL 0.1 MG/GM VA CREA
0.5000 | TOPICAL_CREAM | VAGINAL | 3 refills | Status: DC
Start: 2021-03-04 — End: 2021-04-22

## 2021-03-20 ENCOUNTER — Other Ambulatory Visit: Payer: Self-pay | Admitting: Pulmonary Disease

## 2021-03-26 DIAGNOSIS — D23111 Other benign neoplasm of skin of right upper eyelid, including canthus: Secondary | ICD-10-CM | POA: Diagnosis not present

## 2021-03-31 IMAGING — DX DG CHEST 2V
2 series · 2 of 2 positions shown · non-contrast
Comparison: Chest x-ray 01/28/2019.

CLINICAL DATA: 63-year-old female with history of cough.

EXAM:
CHEST - 2 VIEW

[chest pa]
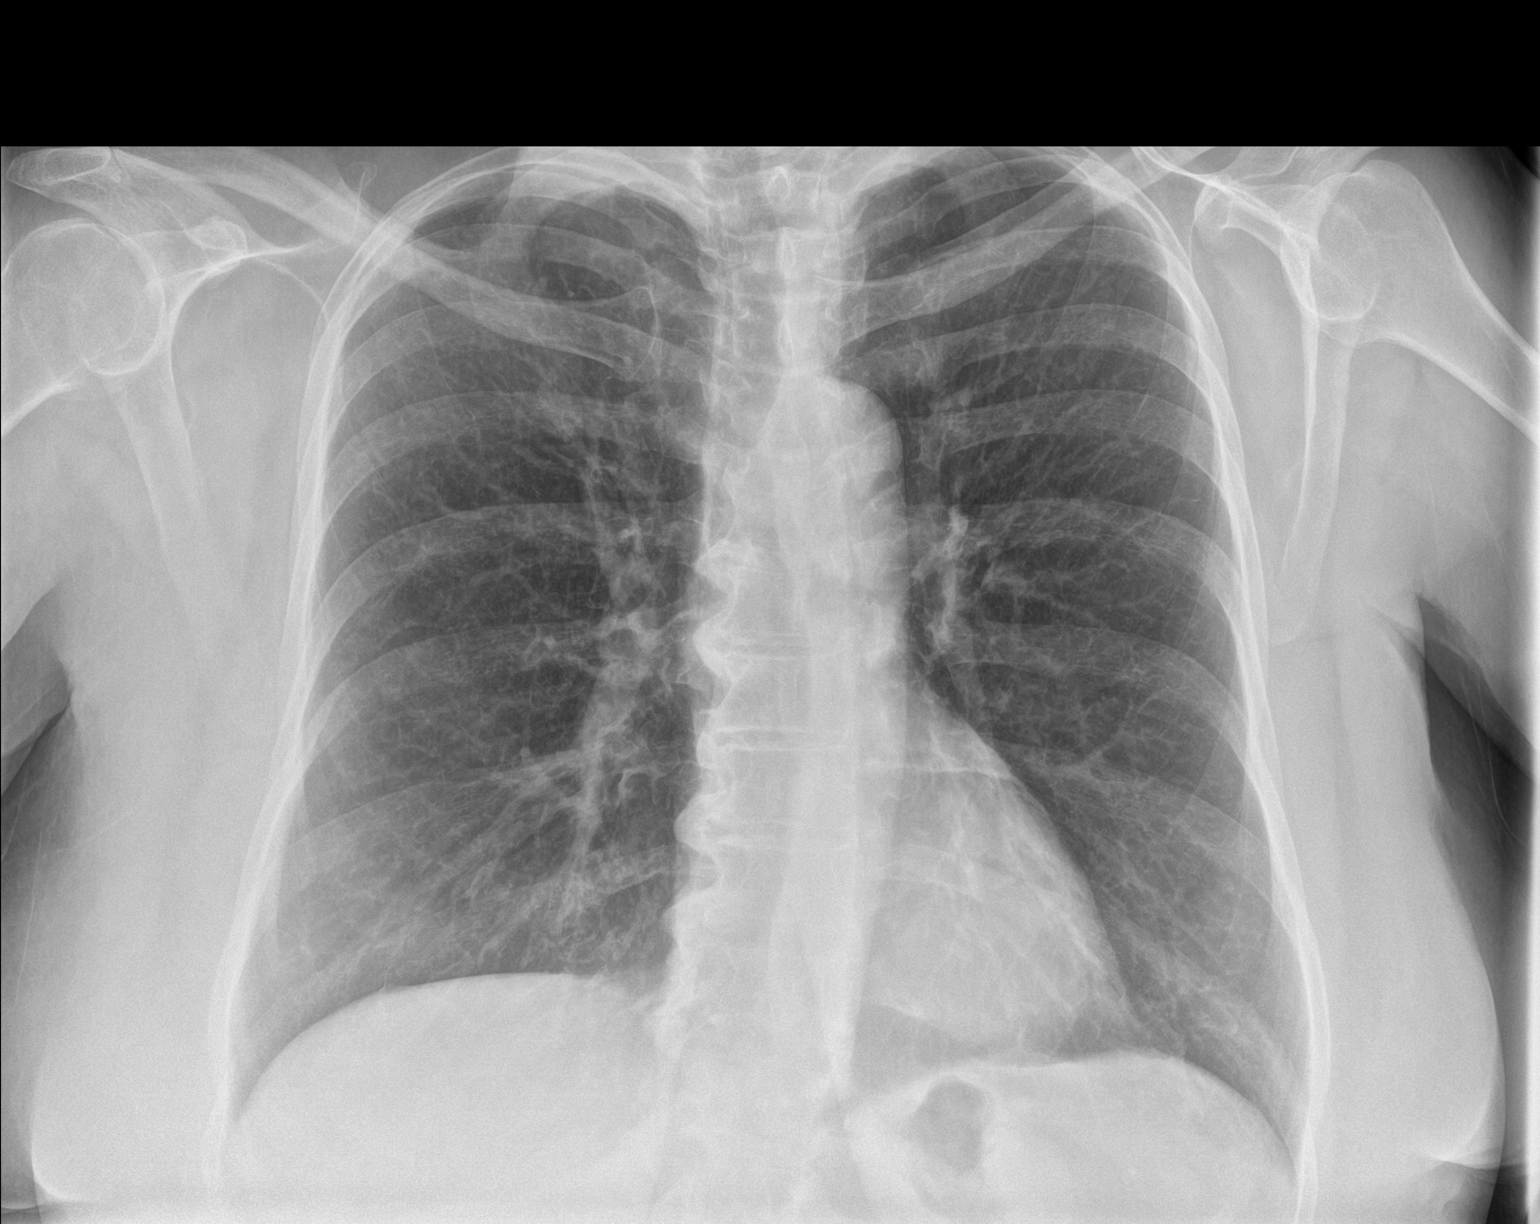

[chest lat]
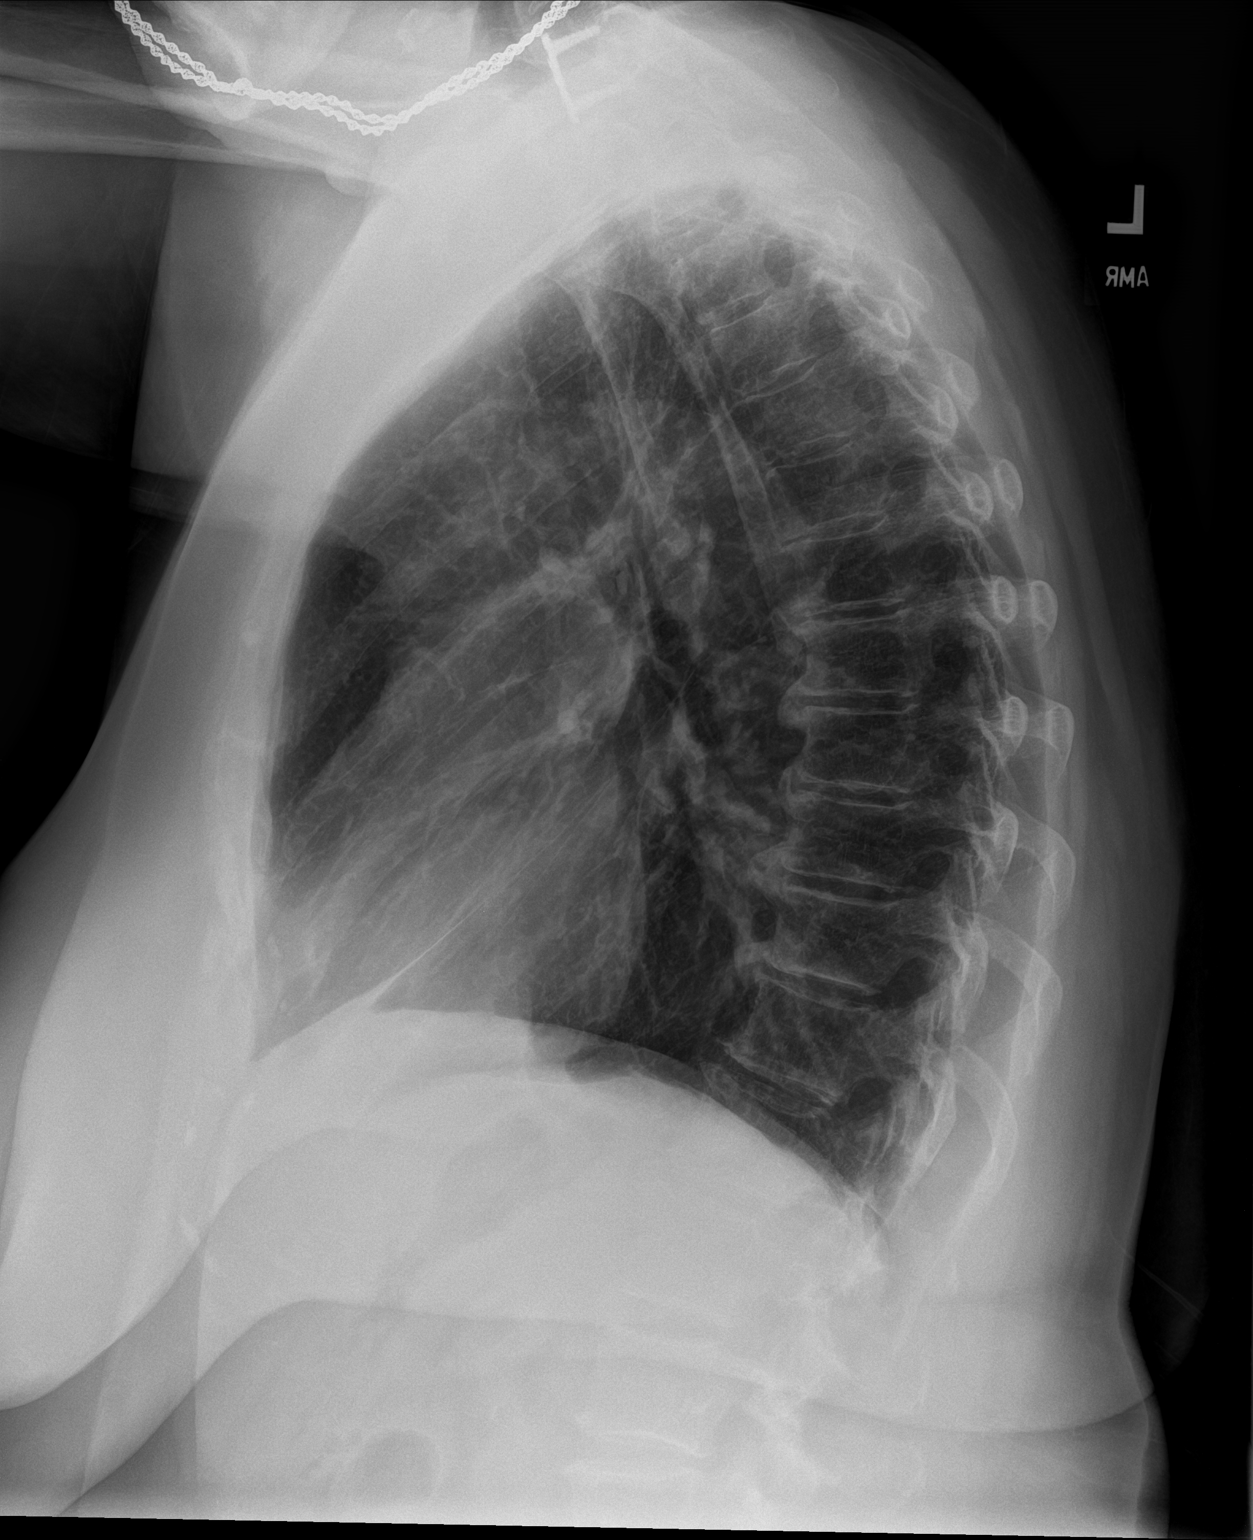

[2 of 2 positions shown; findings below may reference images not displayed]

FINDINGS: Lung volumes are normal. No consolidative airspace disease. No
pleural effusions. No pneumothorax. No pulmonary nodule or mass
noted. Pulmonary vasculature and the cardiomediastinal silhouette
are within normal limits. Orthopedic fixation hardware in the lower
cervical spine incidentally noted.
IMPRESSION: No radiographic evidence of acute cardiopulmonary disease.

## 2021-04-17 DIAGNOSIS — J449 Chronic obstructive pulmonary disease, unspecified: Secondary | ICD-10-CM | POA: Diagnosis not present

## 2021-04-17 DIAGNOSIS — J329 Chronic sinusitis, unspecified: Secondary | ICD-10-CM | POA: Diagnosis not present

## 2021-04-22 ENCOUNTER — Other Ambulatory Visit: Payer: Self-pay

## 2021-04-22 MED ORDER — ESTRADIOL 0.1 MG/GM VA CREA: 0.5000 | TOPICAL_CREAM | VAGINAL | 3 refills | Status: DC

## 2021-05-17 IMAGING — DX DG CHEST 2V
2 series · 2 of 2 positions shown · non-contrast
Comparison: None.

CLINICAL DATA: Chest pain, shortness of breath, and wheezing. COPD.
Smoker.

EXAM:
CHEST - 2 VIEW

[chest pa]
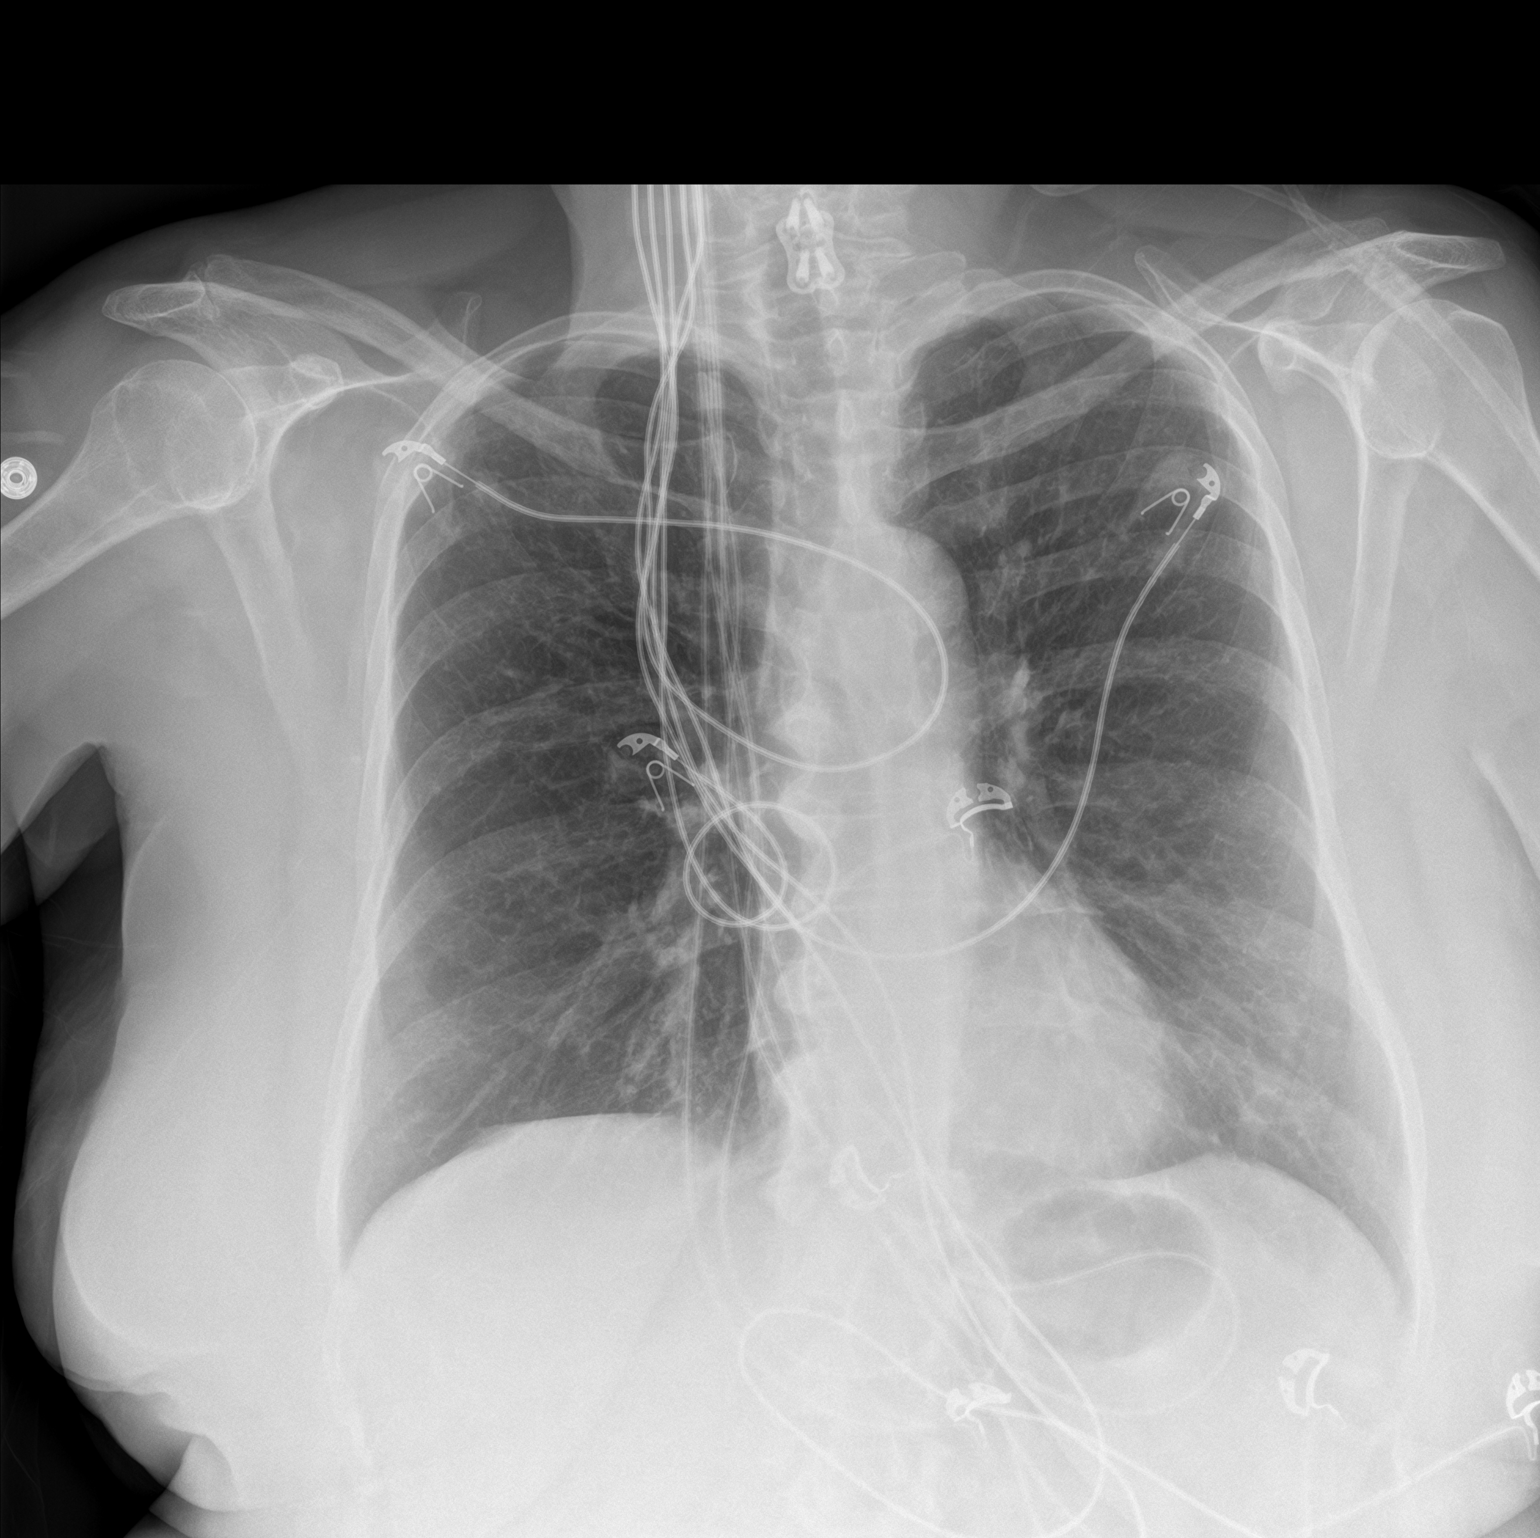

[chest lat]
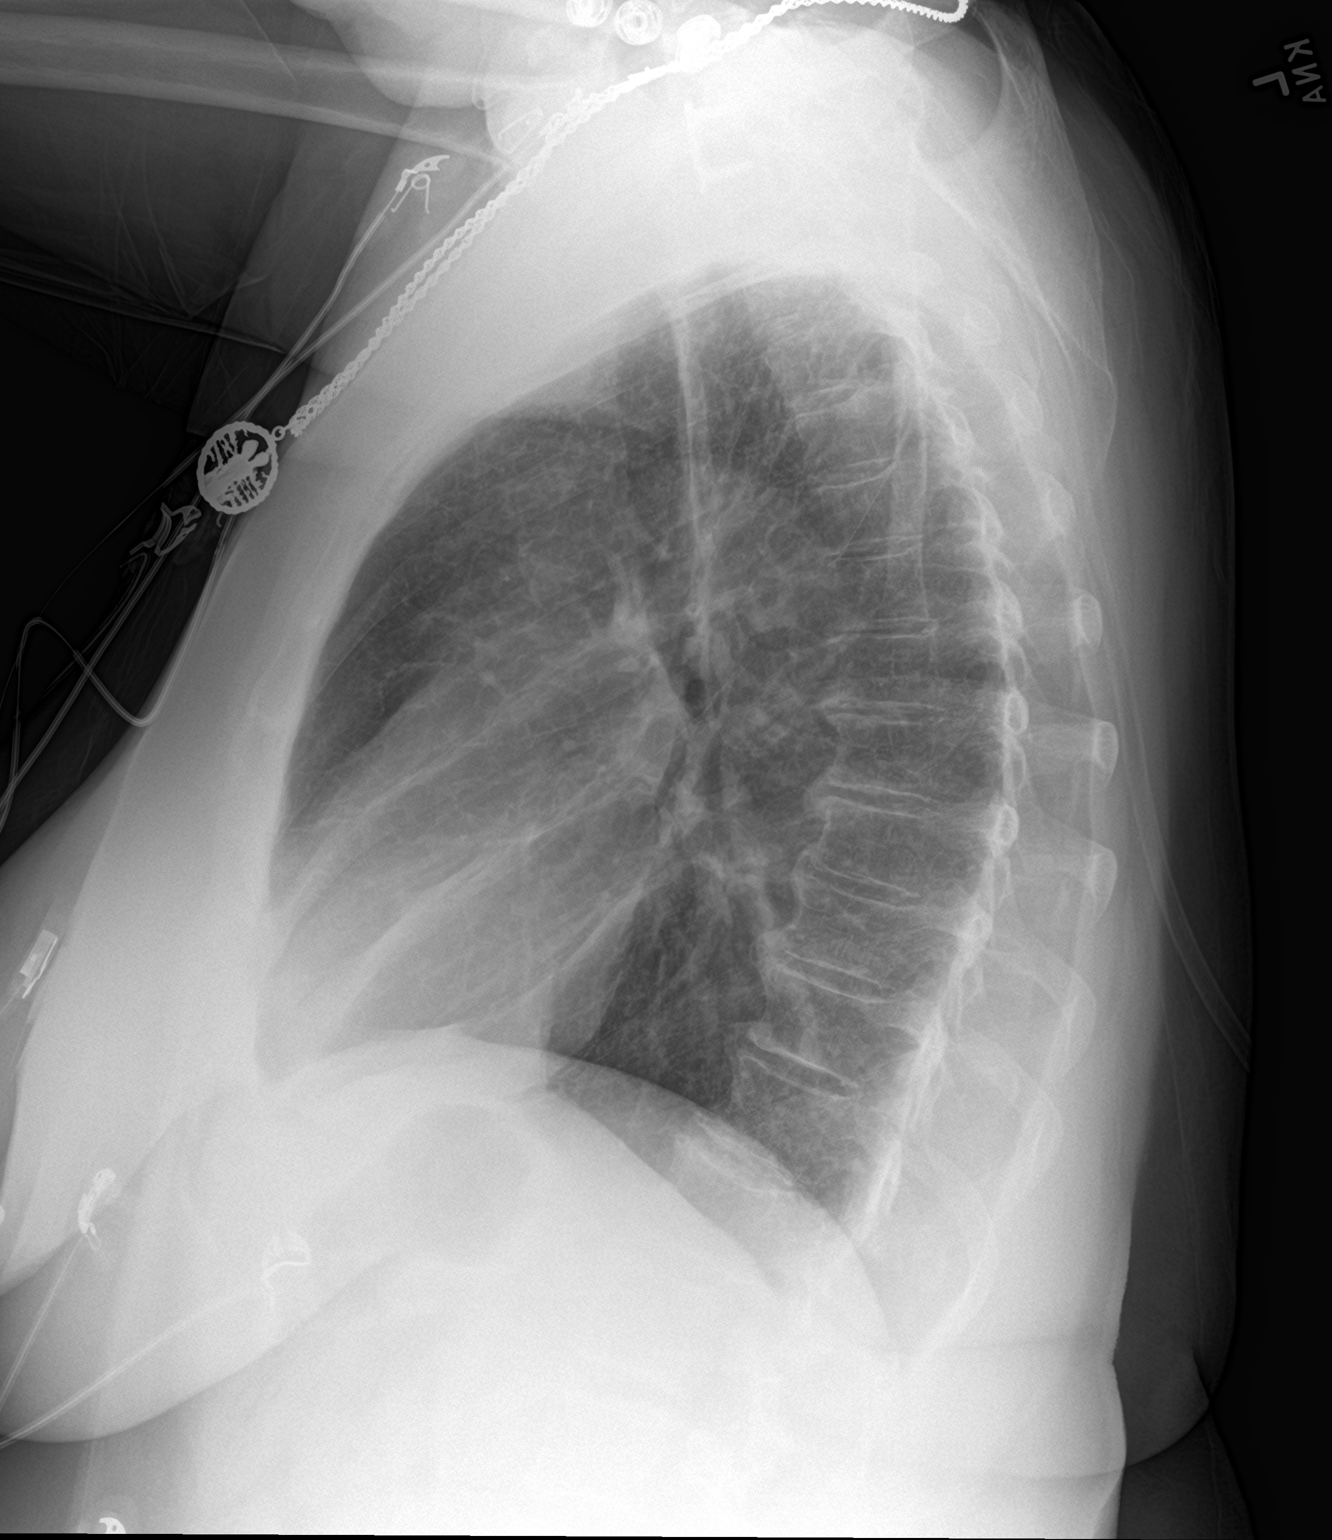

[2 of 2 positions shown; findings below may reference images not displayed]

FINDINGS: The heart size and mediastinal contours are within normal limits.
Both lungs are clear. Cervical spine fusion hardware again noted.
IMPRESSION: No active cardiopulmonary disease.

## 2021-05-20 ENCOUNTER — Other Ambulatory Visit: Payer: Self-pay | Admitting: Internal Medicine

## 2021-05-20 ENCOUNTER — Other Ambulatory Visit: Payer: Self-pay

## 2021-05-20 ENCOUNTER — Ambulatory Visit
Admission: RE | Admit: 2021-05-20 | Discharge: 2021-05-20 | Disposition: A | Discharge status: Home / Self Care | Payer: PPO | Source: Ambulatory Visit | Attending: Internal Medicine | Admitting: Internal Medicine

## 2021-05-20 DIAGNOSIS — Z139 Encounter for screening, unspecified: Secondary | ICD-10-CM

## 2021-05-20 DIAGNOSIS — J209 Acute bronchitis, unspecified: Secondary | ICD-10-CM | POA: Diagnosis not present

## 2021-05-20 DIAGNOSIS — Z6832 Body mass index (BMI) 32.0-32.9, adult: Secondary | ICD-10-CM | POA: Diagnosis not present

## 2021-05-20 DIAGNOSIS — E6609 Other obesity due to excess calories: Secondary | ICD-10-CM | POA: Diagnosis not present

## 2021-05-20 DIAGNOSIS — Z1231 Encounter for screening mammogram for malignant neoplasm of breast: Secondary | ICD-10-CM | POA: Diagnosis not present

## 2021-06-19 ENCOUNTER — Encounter: Payer: Self-pay | Admitting: Pulmonary Disease

## 2021-06-19 NOTE — Telephone Encounter (Signed)
Dr. Vaughan Browner pt would like to know if she needs to get Hep C vaccine. Please advise.

## 2021-06-20 NOTE — Telephone Encounter (Signed)
I am not familiar with the requirements for hepatitis C vaccine.  It would be best if she can check with her primary care about this

## 2021-07-02 ENCOUNTER — Inpatient Hospital Stay (HOSPITAL_COMMUNITY): Payer: PPO | Attending: Hematology

## 2021-07-02 DIAGNOSIS — J449 Chronic obstructive pulmonary disease, unspecified: Secondary | ICD-10-CM | POA: Insufficient documentation

## 2021-07-02 DIAGNOSIS — Z7951 Long term (current) use of inhaled steroids: Secondary | ICD-10-CM | POA: Insufficient documentation

## 2021-07-02 DIAGNOSIS — D72829 Elevated white blood cell count, unspecified: Secondary | ICD-10-CM | POA: Insufficient documentation

## 2021-07-02 DIAGNOSIS — F17210 Nicotine dependence, cigarettes, uncomplicated: Secondary | ICD-10-CM | POA: Diagnosis not present

## 2021-07-02 LAB — CBC WITH DIFFERENTIAL/PLATELET
Abs Immature Granulocytes: 0.04 10*3/uL (ref 0.00–0.07)
Basophils Absolute: 0.1 10*3/uL (ref 0.0–0.1)
Basophils Relative: 1 %
Eosinophils Absolute: 0.2 10*3/uL (ref 0.0–0.5)
Eosinophils Relative: 2 %
HCT: 37.3 % (ref 36.0–46.0)
Hemoglobin: 12.8 g/dL (ref 12.0–15.0)
Immature Granulocytes: 0 %
Lymphocytes Relative: 25 %
Lymphs Abs: 3.3 10*3/uL (ref 0.7–4.0)
MCH: 32.3 pg (ref 26.0–34.0)
MCHC: 34.3 g/dL (ref 30.0–36.0)
MCV: 94.2 fL (ref 80.0–100.0)
Monocytes Absolute: 1 10*3/uL (ref 0.1–1.0)
Monocytes Relative: 7 %
Neutro Abs: 8.6 10*3/uL — ABNORMAL HIGH (ref 1.7–7.7)
Neutrophils Relative %: 65 %
Platelets: 240 10*3/uL (ref 150–400)
RBC: 3.96 MIL/uL (ref 3.87–5.11)
RDW: 12.6 % (ref 11.5–15.5)
WBC: 13.2 10*3/uL — ABNORMAL HIGH (ref 4.0–10.5)
nRBC: 0 % (ref 0.0–0.2)

## 2021-07-03 ENCOUNTER — Encounter: Payer: Self-pay | Admitting: Pulmonary Disease

## 2021-07-03 ENCOUNTER — Ambulatory Visit (INDEPENDENT_AMBULATORY_CARE_PROVIDER_SITE_OTHER): Payer: PPO

## 2021-07-03 ENCOUNTER — Other Ambulatory Visit: Payer: Self-pay

## 2021-07-03 ENCOUNTER — Ambulatory Visit (INDEPENDENT_AMBULATORY_CARE_PROVIDER_SITE_OTHER): Payer: PPO | Admitting: Pulmonary Disease

## 2021-07-03 VITALS — BP 116/74 | HR 92 | Temp 98.6°F | Ht 66.5 in | Wt 202.0 lb

## 2021-07-03 DIAGNOSIS — R062 Wheezing: Secondary | ICD-10-CM | POA: Diagnosis not present

## 2021-07-03 DIAGNOSIS — J449 Chronic obstructive pulmonary disease, unspecified: Secondary | ICD-10-CM

## 2021-07-03 DIAGNOSIS — R059 Cough, unspecified: Secondary | ICD-10-CM

## 2021-07-03 MED ORDER — TRELEGY ELLIPTA 200-62.5-25 MCG/ACT IN AEPB: 1.0000 | INHALATION_SPRAY | Freq: Every day | RESPIRATORY_TRACT | 2 refills | Status: DC

## 2021-07-03 MED ORDER — MONTELUKAST SODIUM 10 MG PO TABS
10.0000 mg | ORAL_TABLET | Freq: Every day | ORAL | 5 refills | Status: DC
Start: 2021-07-03 — End: 2021-07-04

## 2021-07-03 MED ORDER — IPRATROPIUM-ALBUTEROL 0.5-2.5 (3) MG/3ML IN SOLN: 3.0000 mL | Freq: Four times a day (QID) | RESPIRATORY_TRACT | 11 refills | Status: DC | PRN

## 2021-07-03 NOTE — Progress Notes (Signed)
[name]    [id number]    Jan 25, 1956  Primary Care Physician:Fusco, Purcell Nails, MD  Referring Physician: Redmond School, MD [address] [place],  [place] [address]    Chief complaint: Follow-up for asthma  HPI: 65 year old active smoker with COPD, asthma, seasonal allergies Developed dyspnea, cough, hypoxia after receiving the first dose of Moderna vaccine in February.  Treated with supplemental oxygen and steroids.  Reports pneumonia x2 last year and thinks she may had Covid infection at that time.  Seen by my partner Dr. Tamala Julian around May 2021 and diagnosed with asthma, possible allergic reaction to the vaccine.  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  No mold, hot tub, Jacuzzi Smoking history: 23-pack-year smoker.  Continues to smoke half pack per day Travel history: No significant travel history Relevant family history: No significant family issue of lung disease.  Interim History: Has had recurrent episodes of bronchitis, asthma exacerbations since November.  She has been treated with Z-Pak, amoxicillin and 2 rounds of prednisone with minimal improvement Continues to have cough, wheezing, dyspnea.  Sputum is clear in color.  Denies any fevers, chills  Continues to smoke cigarettes.  Outpatient Encounter Medications as of 07/03/2021  Medication Sig   aspirin 81 MG EC tablet Take 81 mg by mouth every other day.   B Complex-C (SUPER B COMPLEX PO) Take 1 tablet by mouth daily.   cetirizine (ZYRTEC ALLERGY) 10 MG tablet Take 1 tablet (10 mg total) by mouth daily.   Cholecalciferol (VITAMIN D3) 25 MCG (1000 UT) CAPS Take 1 capsule by mouth daily.   clotrimazole (MYCELEX) 10 MG troche Take 1 tablet (10 mg total) by mouth 5 (five) times  daily. Let tablet dissolve in the mouth.   Cranberry 1000 MG CAPS Take 1 capsule by mouth daily.   estradiol (ESTRACE) 0.1 MG/GM vaginal cream Place 0.5 Applicatorfuls vaginally See admin instructions. Apply every other night   famotidine (PEPCID) 20 MG tablet Take 1 tablet (20 mg total) by mouth at bedtime.   fluticasone furoate-vilanterol (BREO ELLIPTA) 200-25 MCG/INH AEPB INHALE 1 PUFF INTO THE LUNGS ONCE DAILY.   Garlic 9379 MG CAPS Take 1 capsule by mouth daily.   Multiple Vitamins-Minerals (HAIR SKIN AND NAILS FORMULA) TABS Take 1 tablet by mouth daily.    Multiple Vitamins-Minerals (MULTIVITAMIN ADULTS PO) Take 1 tablet by mouth daily.   Omega-3 Fatty Acids (FISH OIL) 1000 MG CAPS Take 1 capsule by mouth daily.   No facility-administered encounter medications on file as of 07/03/2021.   Physical Exam: Blood pressure 116/74, pulse 92, temperature 98.6 F (37 C), temperature source Oral, height 5' 6.5" (1.689 m), weight 202 lb (91.6 kg), SpO2 95 %. Gen:      No acute distress HEENT:  EOMI, sclera anicteric Neck:     No masses; no thyromegaly Lungs:    Clear to auscultation bilaterally; normal respiratory effort CV:         Regular rate and rhythm; no murmurs Abd:      + bowel sounds; soft, non-tender; no palpable masses, no distension Ext:    No edema; adequate peripheral perfusion Skin:      Warm and dry; no rash Neuro: alert and oriented x 3 Psych: normal mood and affect   Data  Reviewed: Imaging: CT chest 11/26/2019-centrilobular emphysema, subcentimeter pulmonary nodule. I have reviewed the images personally  PFTs: 01/27/2020 FVC 2.98 [88%], FEV1 2.30 [88%], F/F 77, TLC 5.47 [102+], DLCO 20.17 [95%] Normal test  Labs: CBC 01/27/2020-WBC 10.7, eos 2.4%, absolute eosinophil count 364 IgE 01/27/2020-428  Assessment:  Asthma-COPD overlap syndrome Acute visit for exacerbation Has an exacerbation likely precipitated by viral infection.  She tested negative for COVID as an  outpatient She continues to wheeze on examination today She will like to avoid further steroids as it makes her manic We will change Breo to Trelegy, continue albuterol and duo nebs as needed Start Singulair Chest x-ray   Pulmonary nodules Continue annual screening CT chest.  Active smoker Smoking cessation discussed in detail.  She is not ready to quit yet.  Time spent counseling-5 minutes Reassess at return visit  Plan/Recommendations: Change Breo to Trelegy Albuterol, duo nebs Singulair Chest x-ray  Marshell Garfinkel MD [facility] 07/03/2021, 2:39 PM  CC: Redmond School, MD

## 2021-07-03 NOTE — Addendum Note (Signed)
Addended by: Elton Sin on: 07/03/2021 03:00 PM   Modules accepted: Orders

## 2021-07-03 NOTE — Patient Instructions (Signed)
We will change Breo to Trelegy 200 Continue albuterol as needed Order duo nebs to use with meclizine machine We will get a chest x-ray and start Singulair 10 mg a day  Follow-up in 1 to 2 months

## 2021-07-04 ENCOUNTER — Telehealth: Payer: Self-pay | Admitting: Pulmonary Disease

## 2021-07-04 NOTE — Telephone Encounter (Signed)
Dr. Vaughan Browner spoke with [name].  Dr. Vaughan Browner stated [name] could stop Singulair.  [name] scheduled 08/06/21 for follow up.  Nothing further at this time.

## 2021-07-05 DIAGNOSIS — J449 Chronic obstructive pulmonary disease, unspecified: Secondary | ICD-10-CM | POA: Diagnosis not present

## 2021-07-08 NOTE — Progress Notes (Signed)
[place], [place] [address]   CLINIC:  Medical Oncology/Hematology  PCP:  Redmond School, [place] [address] [phone]   REASON FOR VISIT:  Follow-up for leukocytosis  PRIOR THERAPY: None  CURRENT THERAPY: Observation  INTERVAL HISTORY:  [name] 65 y.o. female returns for routine follow-up of her leukocytosis.  She was last seen by Dr. Chryl Heck on 06/27/2020.  At today's visit, she reports feeling fair.  No recent hospitalizations, surgeries, or changes in baseline health status.  She reports that she has been fighting a lung infection for the past 4 to 6 weeks, and has been prescribed Z-Pak, 2 rounds of prednisone, amoxicillin, and Bactrim.  She is continuing to follow with pulmonology for this.  She continues to use inhaled steroids (Trelegy Ellipta) as prescribed for her COPD/asthma.  She continues to smoke 0.5 PPD cigarettes.  She admits to some chills and night sweats in conjunction with her recent lung infection, but denies any fever or unintentional weight loss.  She denies any new lumps or bumps.  She has 100% energy and 50% appetite. She endorses that she is maintaining a stable weight.    REVIEW OF SYSTEMS:  Review of Systems  Constitutional:  Negative for appetite change, chills, diaphoresis, fatigue, fever and unexpected weight change.  HENT:   Negative for lump/mass and nosebleeds.   Eyes:  Negative for eye problems.  Respiratory:  Positive for cough and shortness of breath. Negative for hemoptysis.   Cardiovascular:  Negative for chest pain, leg swelling and palpitations.  Gastrointestinal:  Negative for abdominal pain, blood in stool, constipation, diarrhea, nausea and vomiting.  Genitourinary:  Negative for hematuria.   Skin: Negative.   Neurological:  Negative for dizziness, headaches and light-headedness.  Hematological:  Does not bruise/bleed easily.     PAST MEDICAL/SURGICAL  HISTORY:  Past Medical History:  Diagnosis Date   Asthma    Diverticulosis    Hypercholesteremia    Pneumonia 12/2018   double pneumonia   PONV (postoperative nausea and vomiting)    Seasonal allergies    Shingles    Varicose veins    Past Surgical History:  Procedure Laterality Date   ABDOMINAL HYSTERECTOMY     partial   BACK SURGERY     cervical surgery   COLONOSCOPY N/A 02/04/2017   Procedure: COLONOSCOPY;  Surgeon: Rogene Houston, MD;  [facility]: [facility];  Service: Endoscopy;  Laterality: N/A;  930   ENDOSCOPIC CONCHA BULLOSA RESECTION Bilateral 03/21/2019   Procedure: ENDOSCOPIC CONCHA BULLOSA RESECTION;  Surgeon: Leta Baptist, MD;  Location: [facility];  Service: ENT;  Laterality: Bilateral;   NASAL SEPTOPLASTY W/ TURBINOPLASTY Bilateral 03/21/2019   Procedure: NASAL SEPTOPLASTY WITH BILATERAL TURBINATE REDUCTION;  Surgeon: Leta Baptist, MD;  Location: [facility];  Service: ENT;  Laterality: Bilateral;   TONSILLECTOMY     WRIST SURGERY     age 61yrs     SOCIAL HISTORY:  Social History   Socioeconomic History   Marital status: Widowed    Spouse name: Not on file   Number of children: 0   Years of education: Not on file   Highest education level: Not on file  Occupational History   Not on file  Tobacco Use   Smoking status: Every Day    Packs/day: 0.50    Years: 45.00    Pack years: 22.50    Types: Cigarettes   Smokeless tobacco: Never  Tobacco comments:    Averaging 4 cigarettes daily  Vaping Use   Vaping Use: Former  Substance and Sexual Activity   Alcohol use: Yes    Alcohol/week: 0.0 standard drinks    Comment: occ   Drug use: No   Sexual activity: Not Currently    Birth control/protection: Post-menopausal  Other Topics Concern   Not on file  Social History Narrative   Not on file   Social Determinants of Health   Financial Resource Strain: Not on file  Food Insecurity: Not on file  Transportation Needs: Not on  file  Physical Activity: Not on file  Stress: Not on file  Social Connections: Not on file  Intimate Partner Violence: Not on file    FAMILY HISTORY:  Family History  Problem Relation Age of Onset   Breast cancer Mother    Cancer Mother        breast   Heart attack Mother    Acute myelogenous leukemia Mother    Gallbladder disease Father    Thyroid cancer Maternal Aunt    Brain cancer Maternal Aunt    Breast cancer Maternal Aunt    Lung cancer Maternal Aunt    Bone cancer Paternal Aunt    Diabetes Maternal Grandmother    Dementia Maternal Grandmother    Leukemia Maternal Grandfather    Bone cancer Paternal Grandmother    Breast cancer Cousin    Diabetes Brother    Skin cancer Brother    Diabetes Brother    Colon cancer Neg Hx     CURRENT MEDICATIONS:  Outpatient Encounter Medications as of 07/09/2021  Medication Sig   aspirin 81 MG EC tablet Take 81 mg by mouth every other day.   B Complex-C (SUPER B COMPLEX PO) Take 1 tablet by mouth daily.   cetirizine (ZYRTEC ALLERGY) 10 MG tablet Take 1 tablet (10 mg total) by mouth daily.   Cholecalciferol (VITAMIN D3) 25 MCG (1000 UT) CAPS Take 1 capsule by mouth daily.   clotrimazole (MYCELEX) 10 MG troche Take 1 tablet (10 mg total) by mouth 5 (five) times daily. Let tablet dissolve in the mouth.   Cranberry 1000 MG CAPS Take 1 capsule by mouth daily.   estradiol (ESTRACE) 0.1 MG/GM vaginal cream Place 0.5 Applicatorfuls vaginally See admin instructions. Apply every other night   famotidine (PEPCID) 20 MG tablet Take 1 tablet (20 mg total) by mouth at bedtime.   fluticasone furoate-vilanterol (BREO ELLIPTA) 200-25 MCG/INH AEPB INHALE 1 PUFF INTO THE LUNGS ONCE DAILY.   Fluticasone-Umeclidin-Vilant (TRELEGY ELLIPTA) 200-62.5-25 MCG/ACT AEPB Inhale 1 puff into the lungs daily.   Garlic 2956 MG CAPS Take 1 capsule by mouth daily.   ipratropium-albuterol (DUONEB) 0.5-2.5 (3) MG/3ML SOLN Take 3 mLs by nebulization every 6 (six) hours  as needed.   Multiple Vitamins-Minerals (HAIR SKIN AND NAILS FORMULA) TABS Take 1 tablet by mouth daily.    Multiple Vitamins-Minerals (MULTIVITAMIN ADULTS PO) Take 1 tablet by mouth daily.   Omega-3 Fatty Acids (FISH OIL) 1000 MG CAPS Take 1 capsule by mouth daily.   No facility-administered encounter medications on file as of 07/09/2021.    ALLERGIES:  Allergies  Allergen Reactions   Bee Venom Anaphylaxis   Codeine Nausea And Vomiting   Other Nausea And Vomiting    General anesthesia    Propofol Nausea And Vomiting     PHYSICAL EXAM:  ECOG PERFORMANCE STATUS: 1 - Symptomatic but completely ambulatory  There were no vitals filed for this visit. There  were no vitals filed for this visit. Physical Exam Constitutional:      Appearance: Normal appearance. She is obese.  HENT:     Head: Normocephalic and atraumatic.     Mouth/Throat:     Mouth: Mucous membranes are moist.  Eyes:     Extraocular Movements: Extraocular movements intact.     Pupils: Pupils are equal, round, and reactive to light.  Cardiovascular:     Rate and Rhythm: Normal rate and regular rhythm.     Pulses: Normal pulses.     Heart sounds: Normal heart sounds.  Pulmonary:     Effort: Pulmonary effort is normal.     Breath sounds: Wheezing and rhonchi present.  Abdominal:     General: Bowel sounds are normal.     Palpations: Abdomen is soft.     Tenderness: There is no abdominal tenderness.  Musculoskeletal:        General: No swelling.     Right lower leg: No edema.     Left lower leg: No edema.  Lymphadenopathy:     Cervical: No cervical adenopathy.  Skin:    General: Skin is warm and dry.  Neurological:     General: No focal deficit present.     Mental Status: She is alert and oriented to person, place, and time.  Psychiatric:        Mood and Affect: Mood normal.        Behavior: Behavior normal.     LABORATORY DATA:  I have reviewed the labs as listed.  CBC    Component Value Date/Time    WBC 13.2 (H) 07/02/2021 1320   RBC 3.96 07/02/2021 1320   HGB 12.8 07/02/2021 1320   HCT 37.3 07/02/2021 1320   PLT 240 07/02/2021 1320   MCV 94.2 07/02/2021 1320   MCH 32.3 07/02/2021 1320   MCHC 34.3 07/02/2021 1320   RDW 12.6 07/02/2021 1320   LYMPHSABS 3.3 07/02/2021 1320   MONOABS 1.0 07/02/2021 1320   EOSABS 0.2 07/02/2021 1320   BASOSABS 0.1 07/02/2021 1320   CMP Latest Ref Rng & Units 11/26/2019 03/15/2016  Glucose 70 - 99 mg/dL 94 97  BUN 8 - 23 mg/dL 18 22(H)  Creatinine 0.44 - 1.00 mg/dL 0.70 1.00  Sodium 135 - 145 mmol/L 138 137  Potassium 3.5 - 5.1 mmol/L 4.4 4.2  Chloride 98 - 111 mmol/L 103 103  CO2 22 - 32 mmol/L 28 24  Calcium 8.9 - 10.3 mg/dL 8.9 9.7  Total Protein 6.5 - 8.1 g/dL 7.0 7.2  Total Bilirubin 0.3 - 1.2 mg/dL 0.7 0.6  Alkaline Phos 38 - 126 U/L 77 70  AST 15 - 41 U/L 25 24  ALT 0 - 44 U/L 21 18    DIAGNOSTIC IMAGING:  I have independently reviewed the relevant imaging and discussed with the patient.  ASSESSMENT & PLAN: 1.  Leukocytosis - Patient seen at the request of Dr. Gerarda Fraction as CBC on 10/24/2019 showed white count 14.5 with normal hemoglobin and platelets. - She reported that several of her prior CBCs have shown elevated white counts. - JAK2 V617F and reflex testing, BCR/ABL was negative.  Flow cytometry was negative. - She is a current smoker, 0.5 PPD x40 years.   -She was prescribed inhaled steroids (Trellegy Ellipta) for her underlying asthma/COPD  - No B symptoms, or lumps or bumps - No lymphadenopathy or hepatosplenomegaly palpated on exam - Most recent labs (07/02/2021): WBC 13.2/ANC 8.6, normal Hgb/platelets - As MPN work-up was  negative, differential diagnosis favors reactive leukocytosis in the setting of inhaled steroid use and continued cigarette smoking. - PLAN: Myeloproliferative work-up negative.  No concern for malignant leukocytosis at this time. - Patient can discharge from clinic to follow-up with PCP and pulmonologist. -  She should be referred to [place] in the future if she has persistently elevated WBC greater than 20,000, elevated WBC associated with B symptoms, or other concerning findings.  2.  Tobacco use - Smoking 0.5 PPD x40 years - Lung cancer screening (02/15/2021) lung RADS 2, benign appearance/behavior, being followed by pulmonologist with annual low-dose CT lung cancer screenings  3.  Other history - Health maintenance: Mammogram on 05/20/2021 was BI-RADS Category 1 -Paternal grandmother had and paternal aunt had "bone cancer". -Maternal grandfather had leukemia.  Mother had breast cancer but died of acute myeloid leukemia.  Maternal aunt had metastatic cancer.  Another maternal aunt and maternal cousin died of metastatic breast cancer.   PLAN SUMMARY & DISPOSITION: Discharge from clinic  All questions were answered. The patient knows to call the clinic with any problems, questions or concerns.  Medical decision making: Low  Time spent on visit: I spent 15 minutes counseling the patient face to face. The total time spent in the appointment was 20 minutes and more than 50% was on counseling.   Amber Rush, PA-C  07/09/2021 3:20 PM

## 2021-07-09 ENCOUNTER — Inpatient Hospital Stay (HOSPITAL_BASED_OUTPATIENT_CLINIC_OR_DEPARTMENT_OTHER): Payer: PPO | Admitting: Physician Assistant

## 2021-07-09 ENCOUNTER — Other Ambulatory Visit: Payer: Self-pay

## 2021-07-09 VITALS — BP 128/68 | HR 97 | Temp 97.5°F | Resp 20 | Wt 199.9 lb

## 2021-07-09 DIAGNOSIS — D72829 Elevated white blood cell count, unspecified: Secondary | ICD-10-CM | POA: Diagnosis not present

## 2021-07-09 NOTE — Patient Instructions (Signed)
[place] at [facility] Discharge Instructions  You were seen today by Tarri Abernethy PA-C for your elevated white blood cell counts.  Previous testing of your elevated white blood cells has shown that you do not have any type of blood cancer or bone marrow disorder.  We suspect that your elevated white blood cell counts are due to your cigarette smoking and your use of inhaled steroid medications.  At this time, you do not require any further treatment or follow-up with our office.  However, you can be referred back to [place] in the future if you have persistently elevated white blood cell count greater than 20,000.  You should continue to follow with your primary care provider and your lung doctor for ongoing management of your chronic diseases.   Thank you for choosing [place] at [facility] to provide your oncology and hematology care.  To afford each patient quality time with our provider, please arrive at least 15 minutes before your scheduled appointment time.   If you have a lab appointment with the [facility] please come in thru the Main Entrance and check in at the main information desk.  You need to re-schedule your appointment should you arrive 10 or more minutes late.  We strive to give you quality time with our providers, and arriving late affects you and other patients whose appointments are after yours.  Also, if you no show three or more times for appointments you may be dismissed from the clinic at the providers discretion.     Again, thank you for choosing [facility].  Our hope is that these requests will decrease the amount of time that you wait before being seen by our physicians.       _____________________________________________________________  Should you have questions after your visit to [facility], please contact our office at [phone] and follow the prompts.  Our office hours are 8:00  a.m. and 4:30 p.m. Monday - Friday.  Please note that voicemails left after 4:00 p.m. may not be returned until the following business day.  We are closed weekends and major holidays.  You do have access to a nurse 24-7, just call the main number to the clinic [phone] and do not press any options, hold on the line and a nurse will answer the phone.    For prescription refill requests, have your pharmacy contact our office and allow 72 hours.    Due to Covid, you will need to wear a mask upon entering the hospital. If you do not have a mask, a mask will be given to you at the Main Entrance upon arrival. For doctor visits, patients may have 1 support person age 29 or older with them. For treatment visits, patients can not have anyone with them due to social distancing guidelines and our immunocompromised population.

## 2021-07-21 ENCOUNTER — Encounter: Payer: Self-pay | Admitting: Pulmonary Disease

## 2021-07-23 ENCOUNTER — Inpatient Hospital Stay (HOSPITAL_COMMUNITY)
Admission: EM | Admit: 2021-07-23 | Discharge: 2021-07-26 | DRG: 193 | Disposition: A | Discharge status: Home / Self Care | Payer: PPO | Attending: Internal Medicine | Admitting: Internal Medicine

## 2021-07-23 ENCOUNTER — Encounter (HOSPITAL_COMMUNITY): Payer: Self-pay

## 2021-07-23 ENCOUNTER — Emergency Department (HOSPITAL_COMMUNITY): Payer: PPO

## 2021-07-23 ENCOUNTER — Other Ambulatory Visit: Payer: Self-pay

## 2021-07-23 DIAGNOSIS — R0902 Hypoxemia: Secondary | ICD-10-CM | POA: Diagnosis not present

## 2021-07-23 DIAGNOSIS — Z7982 Long term (current) use of aspirin: Secondary | ICD-10-CM | POA: Diagnosis not present

## 2021-07-23 DIAGNOSIS — Z20822 Contact with and (suspected) exposure to covid-19: Secondary | ICD-10-CM | POA: Diagnosis not present

## 2021-07-23 DIAGNOSIS — B3731 Acute candidiasis of vulva and vagina: Secondary | ICD-10-CM | POA: Diagnosis present

## 2021-07-23 DIAGNOSIS — Z801 Family history of malignant neoplasm of trachea, bronchus and lung: Secondary | ICD-10-CM

## 2021-07-23 DIAGNOSIS — E7800 Pure hypercholesterolemia, unspecified: Secondary | ICD-10-CM | POA: Diagnosis present

## 2021-07-23 DIAGNOSIS — D509 Iron deficiency anemia, unspecified: Secondary | ICD-10-CM | POA: Diagnosis present

## 2021-07-23 DIAGNOSIS — Z803 Family history of malignant neoplasm of breast: Secondary | ICD-10-CM

## 2021-07-23 DIAGNOSIS — F17210 Nicotine dependence, cigarettes, uncomplicated: Secondary | ICD-10-CM | POA: Diagnosis present

## 2021-07-23 DIAGNOSIS — J9601 Acute respiratory failure with hypoxia: Secondary | ICD-10-CM | POA: Diagnosis not present

## 2021-07-23 DIAGNOSIS — E669 Obesity, unspecified: Secondary | ICD-10-CM | POA: Diagnosis not present

## 2021-07-23 DIAGNOSIS — Z833 Family history of diabetes mellitus: Secondary | ICD-10-CM

## 2021-07-23 DIAGNOSIS — Z7951 Long term (current) use of inhaled steroids: Secondary | ICD-10-CM | POA: Diagnosis not present

## 2021-07-23 DIAGNOSIS — R509 Fever, unspecified: Secondary | ICD-10-CM | POA: Diagnosis not present

## 2021-07-23 DIAGNOSIS — Z79899 Other long term (current) drug therapy: Secondary | ICD-10-CM

## 2021-07-23 DIAGNOSIS — J45901 Unspecified asthma with (acute) exacerbation: Secondary | ICD-10-CM | POA: Diagnosis not present

## 2021-07-23 DIAGNOSIS — J440 Chronic obstructive pulmonary disease with acute lower respiratory infection: Secondary | ICD-10-CM | POA: Diagnosis not present

## 2021-07-23 DIAGNOSIS — J984 Other disorders of lung: Secondary | ICD-10-CM | POA: Diagnosis not present

## 2021-07-23 DIAGNOSIS — E876 Hypokalemia: Secondary | ICD-10-CM | POA: Diagnosis not present

## 2021-07-23 DIAGNOSIS — Z90710 Acquired absence of both cervix and uterus: Secondary | ICD-10-CM | POA: Diagnosis not present

## 2021-07-23 DIAGNOSIS — Z808 Family history of malignant neoplasm of other organs or systems: Secondary | ICD-10-CM

## 2021-07-23 DIAGNOSIS — R0689 Other abnormalities of breathing: Secondary | ICD-10-CM | POA: Diagnosis not present

## 2021-07-23 DIAGNOSIS — Z8249 Family history of ischemic heart disease and other diseases of the circulatory system: Secondary | ICD-10-CM

## 2021-07-23 DIAGNOSIS — R918 Other nonspecific abnormal finding of lung field: Secondary | ICD-10-CM | POA: Diagnosis not present

## 2021-07-23 DIAGNOSIS — J189 Pneumonia, unspecified organism: Secondary | ICD-10-CM | POA: Diagnosis not present

## 2021-07-23 DIAGNOSIS — Z806 Family history of leukemia: Secondary | ICD-10-CM | POA: Diagnosis not present

## 2021-07-23 DIAGNOSIS — Z6831 Body mass index (BMI) 31.0-31.9, adult: Secondary | ICD-10-CM

## 2021-07-23 DIAGNOSIS — R000 Tachycardia, unspecified: Secondary | ICD-10-CM | POA: Diagnosis not present

## 2021-07-23 DIAGNOSIS — I959 Hypotension, unspecified: Secondary | ICD-10-CM | POA: Diagnosis not present

## 2021-07-23 LAB — COMPREHENSIVE METABOLIC PANEL
ALT: 25 U/L (ref 0–44)
AST: 29 U/L (ref 15–41)
Albumin: 2.7 g/dL — ABNORMAL LOW (ref 3.5–5.0)
Alkaline Phosphatase: 106 U/L (ref 38–126)
Anion gap: 11 (ref 5–15)
BUN: 25 mg/dL — ABNORMAL HIGH (ref 8–23)
CO2: 22 mmol/L (ref 22–32)
Calcium: 7.9 mg/dL — ABNORMAL LOW (ref 8.9–10.3)
Chloride: 98 mmol/L (ref 98–111)
Creatinine, Ser: 1.05 mg/dL — ABNORMAL HIGH (ref 0.44–1.00)
GFR, Estimated: 58 mL/min — ABNORMAL LOW (ref >60)
Glucose, Bld: 145 mg/dL — ABNORMAL HIGH (ref 70–99)
Potassium: 3.1 mmol/L — ABNORMAL LOW (ref 3.5–5.1)
Sodium: 131 mmol/L — ABNORMAL LOW (ref 135–145)
Total Bilirubin: 2 mg/dL — ABNORMAL HIGH (ref 0.3–1.2)
Total Protein: 6.4 g/dL — ABNORMAL LOW (ref 6.5–8.1)

## 2021-07-23 LAB — CBC WITH DIFFERENTIAL/PLATELET
Abs Immature Granulocytes: 0.21 10*3/uL — ABNORMAL HIGH (ref 0.00–0.07)
Basophils Absolute: 0.1 10*3/uL (ref 0.0–0.1)
Basophils Relative: 0 %
Eosinophils Absolute: 0.1 10*3/uL (ref 0.0–0.5)
Eosinophils Relative: 1 %
HCT: 33 % — ABNORMAL LOW (ref 36.0–46.0)
Hemoglobin: 11.1 g/dL — ABNORMAL LOW (ref 12.0–15.0)
Immature Granulocytes: 1 %
Lymphocytes Relative: 6 %
Lymphs Abs: 1.1 10*3/uL (ref 0.7–4.0)
MCH: 31.2 pg (ref 26.0–34.0)
MCHC: 33.6 g/dL (ref 30.0–36.0)
MCV: 92.7 fL (ref 80.0–100.0)
Monocytes Absolute: 1.6 10*3/uL — ABNORMAL HIGH (ref 0.1–1.0)
Monocytes Relative: 10 %
Neutro Abs: 13.7 10*3/uL — ABNORMAL HIGH (ref 1.7–7.7)
Neutrophils Relative %: 82 %
Platelets: 202 10*3/uL (ref 150–400)
RBC: 3.56 MIL/uL — ABNORMAL LOW (ref 3.87–5.11)
RDW: 12.8 % (ref 11.5–15.5)
WBC: 16.7 10*3/uL — ABNORMAL HIGH (ref 4.0–10.5)
nRBC: 0 % (ref 0.0–0.2)

## 2021-07-23 LAB — FERRITIN: Ferritin: 265 ng/mL (ref 11–307)

## 2021-07-23 LAB — RETICULOCYTES
Immature Retic Fract: 15.7 % (ref 2.3–15.9)
RBC.: 3.49 MIL/uL — ABNORMAL LOW (ref 3.87–5.11)
Retic Count, Absolute: 42.2 10*3/uL (ref 19.0–186.0)
Retic Ct Pct: 1.2 % (ref 0.4–3.1)

## 2021-07-23 LAB — RESP PANEL BY RT-PCR (FLU A&B, COVID) ARPGX2
Influenza A by PCR: NEGATIVE
Influenza B by PCR: NEGATIVE
SARS Coronavirus 2 by RT PCR: NEGATIVE

## 2021-07-23 LAB — URINALYSIS, ROUTINE W REFLEX MICROSCOPIC
Bilirubin Urine: NEGATIVE
Glucose, UA: NEGATIVE mg/dL
Hgb urine dipstick: NEGATIVE
Ketones, ur: NEGATIVE mg/dL
Leukocytes,Ua: NEGATIVE
Nitrite: NEGATIVE
Protein, ur: 30 mg/dL — AB
Specific Gravity, Urine: 1.011 (ref 1.005–1.030)
pH: 5 (ref 5.0–8.0)

## 2021-07-23 LAB — PROTIME-INR
INR: 1.2 (ref 0.8–1.2)
Prothrombin Time: 15.1 seconds (ref 11.4–15.2)

## 2021-07-23 LAB — IRON AND TIBC
Iron: 10 ug/dL — ABNORMAL LOW (ref 28–170)
Saturation Ratios: 5 % — ABNORMAL LOW (ref 10.4–31.8)
TIBC: 201 ug/dL — ABNORMAL LOW (ref 250–450)
UIBC: 191 ug/dL

## 2021-07-23 LAB — VITAMIN B12: Vitamin B-12: 256 pg/mL (ref 180–914)

## 2021-07-23 LAB — HIV ANTIBODY (ROUTINE TESTING W REFLEX): HIV Screen 4th Generation wRfx: NONREACTIVE

## 2021-07-23 LAB — TROPONIN I (HIGH SENSITIVITY)
Troponin I (High Sensitivity): 9 ng/L (ref <18)
Troponin I (High Sensitivity): 7 ng/L (ref <18)

## 2021-07-23 LAB — APTT: aPTT: 31 seconds (ref 24–36)

## 2021-07-23 LAB — FOLATE: Folate: 23.3 ng/mL (ref >5.9)

## 2021-07-23 LAB — LACTIC ACID, PLASMA: Lactic Acid, Venous: 1.9 mmol/L (ref 0.5–1.9)

## 2021-07-23 MED ORDER — ENOXAPARIN SODIUM 40 MG/0.4ML IJ SOSY
40.0000 mg | PREFILLED_SYRINGE | INTRAMUSCULAR | Status: DC
  Administered 2021-07-23 – 2021-07-25 (×3): 40 mg via SUBCUTANEOUS
  Filled 2021-07-23 (×3): qty 0.4

## 2021-07-23 MED ORDER — SODIUM CHLORIDE 0.9% FLUSH: 3.0000 mL | INTRAVENOUS | Status: DC | PRN

## 2021-07-23 MED ORDER — ACETAMINOPHEN 325 MG PO TABS
650.0000 mg | ORAL_TABLET | Freq: Four times a day (QID) | ORAL | Status: DC | PRN
  Administered 2021-07-25 – 2021-07-26 (×5): 650 mg via ORAL
  Filled 2021-07-23 (×5): qty 2

## 2021-07-23 MED ORDER — ESTRADIOL 0.1 MG/GM VA CREA
0.5000 | TOPICAL_CREAM | VAGINAL | Status: DC
  Filled 2021-07-23: qty 42.5

## 2021-07-23 MED ORDER — HAIR SKIN AND NAILS FORMULA PO TABS: 1.0000 | ORAL_TABLET | Freq: Every day | ORAL | Status: DC

## 2021-07-23 MED ORDER — ONDANSETRON HCL 4 MG/2ML IJ SOLN
4.0000 mg | Freq: Four times a day (QID) | INTRAMUSCULAR | Status: DC | PRN
  Filled 2021-07-23: qty 2

## 2021-07-23 MED ORDER — IPRATROPIUM-ALBUTEROL 0.5-2.5 (3) MG/3ML IN SOLN: 3.0000 mL | Freq: Four times a day (QID) | RESPIRATORY_TRACT | Status: DC | PRN

## 2021-07-23 MED ORDER — SODIUM CHLORIDE 0.9 % IV SOLN
500.0000 mg | Freq: Once | INTRAVENOUS | Status: AC
  Administered 2021-07-23: 500 mg via INTRAVENOUS
  Filled 2021-07-23: qty 5

## 2021-07-23 MED ORDER — ONDANSETRON HCL 4 MG PO TABS: 4.0000 mg | ORAL_TABLET | Freq: Four times a day (QID) | ORAL | Status: DC | PRN

## 2021-07-23 MED ORDER — BUDESONIDE 0.25 MG/2ML IN SUSP
0.2500 mg | Freq: Two times a day (BID) | RESPIRATORY_TRACT | Status: DC
  Administered 2021-07-23 – 2021-07-26 (×6): 0.25 mg via RESPIRATORY_TRACT
  Filled 2021-07-23 (×6): qty 2

## 2021-07-23 MED ORDER — ACETAMINOPHEN 650 MG RE SUPP: 650.0000 mg | Freq: Four times a day (QID) | RECTAL | Status: DC | PRN

## 2021-07-23 MED ORDER — IPRATROPIUM-ALBUTEROL 0.5-2.5 (3) MG/3ML IN SOLN
3.0000 mL | Freq: Four times a day (QID) | RESPIRATORY_TRACT | Status: DC
  Administered 2021-07-23 (×2): 3 mL via RESPIRATORY_TRACT
  Filled 2021-07-23 (×2): qty 3

## 2021-07-23 MED ORDER — SODIUM CHLORIDE 0.9 % IV SOLN
1.0000 g | Freq: Once | INTRAVENOUS | Status: AC
  Administered 2021-07-24: 1 g via INTRAVENOUS
  Filled 2021-07-23: qty 10

## 2021-07-23 MED ORDER — IPRATROPIUM-ALBUTEROL 0.5-2.5 (3) MG/3ML IN SOLN
3.0000 mL | Freq: Three times a day (TID) | RESPIRATORY_TRACT | Status: DC
  Administered 2021-07-24 – 2021-07-26 (×8): 3 mL via RESPIRATORY_TRACT
  Filled 2021-07-23 (×8): qty 3

## 2021-07-23 MED ORDER — ASPIRIN EC 81 MG PO TBEC
81.0000 mg | DELAYED_RELEASE_TABLET | ORAL | Status: DC
  Administered 2021-07-24 – 2021-07-26 (×2): 81 mg via ORAL
  Filled 2021-07-23 (×3): qty 1

## 2021-07-23 MED ORDER — FLUTICASONE-UMECLIDIN-VILANT 200-62.5-25 MCG/ACT IN AEPB: 1.0000 | INHALATION_SPRAY | Freq: Every day | RESPIRATORY_TRACT | Status: DC

## 2021-07-23 MED ORDER — CRANBERRY 1000 MG PO CAPS: 1.0000 | ORAL_CAPSULE | Freq: Every day | ORAL | Status: DC

## 2021-07-23 MED ORDER — LACTATED RINGERS IV BOLUS (SEPSIS)
1000.0000 mL | Freq: Once | INTRAVENOUS | Status: AC
  Administered 2021-07-23: 1000 mL via INTRAVENOUS

## 2021-07-23 MED ORDER — GARLIC 1000 MG PO CAPS: 1.0000 | ORAL_CAPSULE | Freq: Every day | ORAL | Status: DC

## 2021-07-23 MED ORDER — LORATADINE 10 MG PO TABS
10.0000 mg | ORAL_TABLET | Freq: Every day | ORAL | Status: DC
  Administered 2021-07-24 – 2021-07-26 (×3): 10 mg via ORAL
  Filled 2021-07-23 (×3): qty 1

## 2021-07-23 MED ORDER — GUAIFENESIN-DM 100-10 MG/5ML PO SYRP
5.0000 mL | ORAL_SOLUTION | ORAL | Status: DC | PRN
  Administered 2021-07-24 – 2021-07-26 (×5): 5 mL via ORAL
  Filled 2021-07-23 (×5): qty 5

## 2021-07-23 MED ORDER — SODIUM CHLORIDE 0.9% FLUSH
3.0000 mL | Freq: Two times a day (BID) | INTRAVENOUS | Status: DC
  Administered 2021-07-23 – 2021-07-26 (×7): 3 mL via INTRAVENOUS

## 2021-07-23 MED ORDER — SODIUM CHLORIDE 0.9 % IV SOLN
1.0000 g | Freq: Once | INTRAVENOUS | Status: AC
  Administered 2021-07-23: 1 g via INTRAVENOUS
  Filled 2021-07-23: qty 10

## 2021-07-23 MED ORDER — POTASSIUM CHLORIDE CRYS ER 20 MEQ PO TBCR
40.0000 meq | EXTENDED_RELEASE_TABLET | Freq: Once | ORAL | Status: AC
  Administered 2021-07-23: 40 meq via ORAL
  Filled 2021-07-23: qty 2

## 2021-07-23 MED ORDER — SODIUM CHLORIDE 0.9 % IV SOLN
250.0000 mL | INTRAVENOUS | Status: DC | PRN
  Administered 2021-07-24: 250 mL via INTRAVENOUS

## 2021-07-23 MED ORDER — SODIUM CHLORIDE 0.9 % IV SOLN
500.0000 mg | INTRAVENOUS | Status: DC
  Administered 2021-07-24 – 2021-07-25 (×2): 500 mg via INTRAVENOUS
  Filled 2021-07-23 (×2): qty 5

## 2021-07-23 NOTE — H&P (Signed)
History and Physical    [name] SWN:[id number] DOB: 1955/08/21 DOA: 07/23/2021  PCP: Redmond School, MD   Patient coming from: Home  Chief Complaint: Fever  HPI: [name] is a 66 y.o. female with medical history significant for asthma and tobacco abuse who presented to the ED with complaints of fever up to 102 Fahrenheit as well as a nonproductive cough and right-sided chest pain.  She also claims of had some diarrhea and vomiting ongoing for approximately a week and states that she has been sick since November with multiple courses of antibiotics that have not helped her symptoms.  She denies any specific alleviating or aggravating factors.   ED Course: Vital signs are stable and patient is afebrile.  She was noted to be hypoxic in the ED and is now requiring 3 L nasal cannula oxygen.  Leukocytosis of 16,700 noted with hemoglobin 11 and potassium 3.1.  Chest x-ray with right upper lobe pneumonia noted and COVID and flu testing negative.  She has been started on IV antibiotics.  Review of Systems: Reviewed as noted above, otherwise negative.  Past Medical History:  Diagnosis Date   Asthma    Diverticulosis    Hypercholesteremia    Pneumonia 12/2018   double pneumonia   PONV (postoperative nausea and vomiting)    Seasonal allergies    Shingles    Varicose veins     Past Surgical History:  Procedure Laterality Date   ABDOMINAL HYSTERECTOMY     partial   BACK SURGERY     cervical surgery   COLONOSCOPY N/A 02/04/2017   Procedure: COLONOSCOPY;  Surgeon: Rogene Houston, MD;  Location: [facility];  Service: Endoscopy;  Laterality: N/A;  930   ENDOSCOPIC CONCHA BULLOSA RESECTION Bilateral 03/21/2019   Procedure: ENDOSCOPIC CONCHA BULLOSA RESECTION;  Surgeon: Leta Baptist, MD;  Location: [place];  Service: ENT;  Laterality: Bilateral;   NASAL SEPTOPLASTY W/ TURBINOPLASTY Bilateral 03/21/2019   Procedure: NASAL SEPTOPLASTY WITH BILATERAL TURBINATE  REDUCTION;  Surgeon: Leta Baptist, MD;  Location: [place];  Service: ENT;  Laterality: Bilateral;   TONSILLECTOMY     WRIST SURGERY     age 61yrs     reports that she has been smoking cigarettes. She has a 22.50 pack-year smoking history. She has never used smokeless tobacco. She reports current alcohol use. She reports that she does not use drugs.  Allergies  Allergen Reactions   Bee Venom Anaphylaxis   Codeine Nausea And Vomiting   Other Nausea And Vomiting    General anesthesia    Propofol Nausea And Vomiting    Family History  Problem Relation Age of Onset   Breast cancer Mother    Cancer Mother        breast   Heart attack Mother    Acute myelogenous leukemia Mother    Gallbladder disease Father    Thyroid cancer Maternal Aunt    Brain cancer Maternal Aunt    Breast cancer Maternal Aunt    Lung cancer Maternal Aunt    Bone cancer Paternal Aunt    Diabetes Maternal Grandmother    Dementia Maternal Grandmother    Leukemia Maternal Grandfather    Bone cancer Paternal Grandmother    Breast cancer Cousin    Diabetes Brother    Skin cancer Brother    Diabetes Brother    Colon cancer Neg Hx     Prior to Admission medications   Medication Sig Start Date End  Date Taking? Authorizing Provider  aspirin 81 MG EC tablet Take 81 mg by mouth every other day.   Yes [provider]  B Complex-C (SUPER B COMPLEX PO) Take 1 tablet by mouth daily.   Yes [provider]  cetirizine (ZYRTEC ALLERGY) 10 MG tablet Take 1 tablet (10 mg total) by mouth daily. 12/07/19  Yes Amber Furbish, MD  Cholecalciferol (VITAMIN D3) 25 MCG (1000 UT) CAPS Take 1 capsule by mouth daily.   Yes [provider]  Cranberry 1000 MG CAPS Take 1 capsule by mouth daily.   Yes [provider]  estradiol (ESTRACE) 0.1 MG/GM vaginal cream Place 0.5 Applicatorfuls vaginally See admin instructions. Apply every other night 04/22/21  Yes McKenzie, Amber Furbish, MD   Fluticasone-Umeclidin-Vilant (TRELEGY ELLIPTA) 200-62.5-25 MCG/ACT AEPB Inhale 1 puff into the lungs daily. 07/03/21  Yes Mannam, Praveen, MD  Garlic 0102 MG CAPS Take 1 capsule by mouth daily.   Yes [provider]  ipratropium-albuterol (DUONEB) 0.5-2.5 (3) MG/3ML SOLN Take 3 mLs by nebulization every 6 (six) hours as needed. 07/03/21  Yes Mannam, Praveen, MD  Multiple Vitamins-Minerals (HAIR SKIN AND NAILS FORMULA) TABS Take 1 tablet by mouth daily.    Yes [provider]  Multiple Vitamins-Minerals (MULTIVITAMIN ADULTS PO) Take 1 tablet by mouth daily.   Yes [provider]  Omega-3 Fatty Acids (FISH OIL) 1000 MG CAPS Take 1 capsule by mouth daily.   Yes [provider]  clotrimazole (MYCELEX) 10 MG troche Take 1 tablet (10 mg total) by mouth 5 (five) times daily. Let tablet dissolve in the mouth. Patient not taking: Reported on 07/23/2021 12/18/20   Saunders, Amber Mu, NP  famotidine (PEPCID) 20 MG tablet Take 1 tablet (20 mg total) by mouth at bedtime. Patient not taking: Reported on 07/23/2021 12/07/19   Amber Furbish, MD  fluticasone furoate-vilanterol (BREO ELLIPTA) 200-25 MCG/INH AEPB INHALE 1 PUFF INTO THE LUNGS ONCE DAILY. Patient not taking: Reported on 07/23/2021 03/20/21   Marshell Garfinkel, MD    Physical Exam: Vitals:   07/23/21 1008 07/23/21 1013 07/23/21 1100 07/23/21 1230  BP: (!) 114/55  (!) 94/54 101/60  Pulse: (!) 122  84 73  Resp: 20  17   Temp: 99.9 F (37.7 C)     TempSrc: Oral     SpO2: 99%  96% 97%  Weight:  90.7 kg    Height:  5\' 7"  (1.702 m)      Constitutional: NAD, calm, comfortable Vitals:   07/23/21 1008 07/23/21 1013 07/23/21 1100 07/23/21 1230  BP: (!) 114/55  (!) 94/54 101/60  Pulse: (!) 122  84 73  Resp: 20  17   Temp: 99.9 F (37.7 C)     TempSrc: Oral     SpO2: 99%  96% 97%  Weight:  90.7 kg    Height:  5\' 7"  (1.702 m)     Eyes: lids and conjunctivae normal Neck: normal, supple Respiratory: Minimal wheezing  noted bilaterally and is currently on 3 L nasal cannula Cardiovascular: Regular rate and rhythm, no murmurs. Abdomen: no tenderness, no distention. Bowel sounds positive.  Musculoskeletal:  No edema. Skin: no rashes, lesions, ulcers.  Psychiatric: Flat affect  Labs on Admission: I have personally reviewed following labs and imaging studies  CBC: Recent Labs  Lab 07/23/21 1115  WBC 16.7*  NEUTROABS 13.7*  HGB 11.1*  HCT 33.0*  MCV 92.7  PLT 725   Basic Metabolic Panel: Recent Labs  Lab 07/23/21 1115  NA 131*  K 3.1*  CL 98  CO2 22  GLUCOSE 145*  BUN 25*  CREATININE 1.05*  CALCIUM 7.9*   GFR: Estimated Creatinine Clearance: 60.1 mL/min (A) (by C-G formula based on SCr of 1.05 mg/dL (H)). Liver Function Tests: Recent Labs  Lab 07/23/21 1115  AST 29  ALT 25  ALKPHOS 106  BILITOT 2.0*  PROT 6.4*  ALBUMIN 2.7*   No results for input(s): LIPASE, AMYLASE in the last 168 hours. No results for input(s): AMMONIA in the last 168 hours. Coagulation Profile: Recent Labs  Lab 07/23/21 1115  INR 1.2   Cardiac Enzymes: No results for input(s): CKTOTAL, CKMB, CKMBINDEX, TROPONINI in the last 168 hours. BNP (last 3 results) No results for input(s): PROBNP in the last 8760 hours. HbA1C: No results for input(s): HGBA1C in the last 72 hours. CBG: No results for input(s): GLUCAP in the last 168 hours. Lipid Profile: No results for input(s): CHOL, HDL, LDLCALC, TRIG, CHOLHDL, LDLDIRECT in the last 72 hours. Thyroid Function Tests: No results for input(s): TSH, T4TOTAL, FREET4, T3FREE, THYROIDAB in the last 72 hours. Anemia Panel: No results for input(s): VITAMINB12, FOLATE, FERRITIN, TIBC, IRON, RETICCTPCT in the last 72 hours. Urine analysis:    Component Value Date/Time   COLORURINE YELLOW 11/26/2019 1146   APPEARANCEUR CLEAR 11/26/2019 1146   LABSPEC 1.008 11/26/2019 1146   PHURINE 8.0 11/26/2019 1146   GLUCOSEU NEGATIVE 11/26/2019 1146   [place]  11/26/2019 [place] 11/26/2019 [place] 11/26/2019 1146   PROTEINUR NEGATIVE 11/26/2019 1146   NITRITE NEGATIVE 11/26/2019 [place] 11/26/2019 1146    Radiological Exams on Admission: DG Chest Port 1 View  Result Date: 07/23/2021 CLINICAL DATA:  Question sepsis EXAM: PORTABLE CHEST 1 VIEW COMPARISON:  07/03/2021 FINDINGS: Interval development of right upper lobe airspace disease. Probable pneumonia based on the rapid development and symptoms. Left lung clear. No effusion identified. Heart size and vascularity normal. IMPRESSION: Right upper lobe infiltrate likely pneumonia. Followup PA and lateral chest X-ray is recommended in 3-4 weeks following trial of antibiotic therapy to ensure resolution and exclude underlying malignancy. Electronically Signed   By: Franchot Gallo M.D.   On: 07/23/2021 11:03     Assessment/Plan Principal Problem:   Acute hypoxemic respiratory failure (HCC)    Acute hypoxemic respiratory failure secondary to right upper lobe pneumonia -Treat with Rocephin and azithromycin -Flu and COVID testing negative -Check strep pneumonia and Legionella antigen -Antitussives -Monitor repeat labs  Mild asthma exacerbation secondary to above -Pulmicort and breathing treatments ordered -Could consider addition of systemic steroids if needed for worsening symptoms  Mild hypokalemia -Replete and reevaluate in a.m.  Anemia -Check anemia panel  Recent tobacco abuse -Apparently quit 06/2021  DVT prophylaxis: Lovenox Code Status: Full Family Communication: None at bedside; pt will call Disposition Plan:Admit for PNA treatment Consults called:None Admission status: Obs, Tele   D  DO [facility]  If 7PM-7AM, please contact night-coverage [email or web address]  07/23/2021, 1:39 PM

## 2021-07-23 NOTE — ED Triage Notes (Addendum)
Pt via EMS for fever and diarrhea that started 5 days prior with weakness ans shortness of breath. EMS gave 1G around 10:00 tylenol and 277ml NS.

## 2021-07-23 NOTE — ED Provider Notes (Signed)
[facility] EMERGENCY DEPARTMENT Provider Note   CSN: [id number] Arrival date & time: 07/23/21  1001     History  Chief Complaint  Patient presents with   Fever    [name] is a 66 y.o. female.  Patient complaining of fever up to 102, cough, nonproductive, right-sided chest pain and pain in her ribs, diarrhea and vomiting that is been going on for at least a week.  She said she is actually been sick since November and has had multiple courses of antibiotics that do not seem to help.  The history is provided by the patient.  Fever Max temp prior to arrival:  102 Temp source:  Oral Onset quality:  Gradual Timing:  Intermittent Progression:  Unchanged Chronicity:  New Relieved by:  None tried Worsened by:  Nothing Ineffective treatments:  None tried Associated symptoms: chest pain, congestion, cough, diarrhea, headaches, myalgias, nausea and vomiting   Associated symptoms: no confusion, no dysuria, no rash, no rhinorrhea and no sore throat       Home Medications Prior to Admission medications   Medication Sig Start Date End Date Taking? Authorizing Provider  aspirin 81 MG EC tablet Take 81 mg by mouth every other day.    [provider]  B Complex-C (SUPER B COMPLEX PO) Take 1 tablet by mouth daily.    [provider]  cetirizine (ZYRTEC ALLERGY) 10 MG tablet Take 1 tablet (10 mg total) by mouth daily. 12/07/19   [name], MD  Cholecalciferol (VITAMIN D3) 25 MCG (1000 UT) CAPS Take 1 capsule by mouth daily.    [provider]  clotrimazole (MYCELEX) 10 MG troche Take 1 tablet (10 mg total) by mouth 5 (five) times daily. Let tablet dissolve in the mouth. 12/18/20   Parrett, Tammy S, NP  Cranberry 1000 MG CAPS Take 1 capsule by mouth daily.    [provider]  estradiol (ESTRACE) 0.1 MG/GM vaginal cream Place 0.5 Applicatorfuls vaginally See admin instructions. Apply every other night 04/22/21   Cleon Gustin, MD  famotidine  (PEPCID) 20 MG tablet Take 1 tablet (20 mg total) by mouth at bedtime. 12/07/19   [name], MD  fluticasone furoate-vilanterol (BREO ELLIPTA) 200-25 MCG/INH AEPB INHALE 1 PUFF INTO THE LUNGS ONCE DAILY. 03/20/21   Mannam, Hart Robinsons, MD  Fluticasone-Umeclidin-Vilant (TRELEGY ELLIPTA) 200-62.5-25 MCG/ACT AEPB Inhale 1 puff into the lungs daily. 07/03/21   Mannam, Hart Robinsons, MD  Garlic 7408 MG CAPS Take 1 capsule by mouth daily.    [provider]  ipratropium-albuterol (DUONEB) 0.5-2.5 (3) MG/3ML SOLN Take 3 mLs by nebulization every 6 (six) hours as needed. 07/03/21   Mannam, Hart Robinsons, MD  Multiple Vitamins-Minerals (HAIR SKIN AND NAILS FORMULA) TABS Take 1 tablet by mouth daily.     [provider]  Multiple Vitamins-Minerals (MULTIVITAMIN ADULTS PO) Take 1 tablet by mouth daily.    [provider]  Omega-3 Fatty Acids (FISH OIL) 1000 MG CAPS Take 1 capsule by mouth daily.    [provider]      Allergies    Bee venom, Codeine, Other, and Propofol    Review of Systems   Review of Systems  Constitutional:  Positive for fever.  HENT:  Positive for congestion. Negative for rhinorrhea and sore throat.   Eyes:  Negative for visual disturbance.  Respiratory:  Positive for cough. Negative for shortness of breath.   Cardiovascular:  Positive for chest pain.  Gastrointestinal:  Positive for diarrhea, nausea and vomiting.  Negative for abdominal pain.  Genitourinary:  Negative for dysuria.  Musculoskeletal:  Positive for myalgias.  Skin:  Negative for rash.  Neurological:  Positive for headaches.  Psychiatric/Behavioral:  Negative for confusion.    Physical Exam Updated Vital Signs BP (!) 114/55 (BP Location: Left Arm)    Pulse (!) 122    Temp 99.9 F (37.7 C) (Oral)    Resp 20    Ht 5\' 7"  (1.702 m)    Wt 90.7 kg    SpO2 99%    BMI 31.32 kg/m  Physical Exam Vitals and nursing note reviewed.  Constitutional:      General: She is not in acute distress.     Appearance: Normal appearance. She is well-developed.  HENT:     Head: Normocephalic and atraumatic.     Mouth/Throat:     Mouth: Mucous membranes are moist.     Pharynx: Oropharynx is clear.  Eyes:     Conjunctiva/sclera: Conjunctivae normal.  Cardiovascular:     Rate and Rhythm: Regular rhythm. Tachycardia present.     Heart sounds: No murmur heard. Pulmonary:     Effort: Pulmonary effort is normal. No respiratory distress.     Breath sounds: Normal breath sounds.  Abdominal:     Palpations: Abdomen is soft.     Tenderness: There is no abdominal tenderness. There is no guarding or rebound.  Musculoskeletal:        General: No swelling.     Cervical back: Neck supple.     Right lower leg: No edema.     Left lower leg: No edema.  Skin:    General: Skin is warm and dry.     Capillary Refill: Capillary refill takes less than 2 seconds.  Neurological:     General: No focal deficit present.     Mental Status: She is alert.     Sensory: No sensory deficit.     Motor: No weakness.  Psychiatric:        Mood and Affect: Mood normal.    ED Results / Procedures / Treatments   Labs (all labs ordered are listed, but only abnormal results are displayed) Labs Reviewed  COMPREHENSIVE METABOLIC PANEL - Abnormal; Notable for the following components:      Result Value   Sodium 131 (*)    Potassium 3.1 (*)    Glucose, Bld 145 (*)    BUN 25 (*)    Creatinine, Ser 1.05 (*)    Calcium 7.9 (*)    Total Protein 6.4 (*)    Albumin 2.7 (*)    Total Bilirubin 2.0 (*)    GFR, Estimated 58 (*)    All other components within normal limits  CBC WITH DIFFERENTIAL/PLATELET - Abnormal; Notable for the following components:   WBC 16.7 (*)    RBC 3.56 (*)    Hemoglobin 11.1 (*)    HCT 33.0 (*)    Neutro Abs 13.7 (*)    Monocytes Absolute 1.6 (*)    Abs Immature Granulocytes 0.21 (*)    All other components within normal limits  URINALYSIS, ROUTINE W REFLEX MICROSCOPIC - Abnormal; Notable for  the following components:   APPearance HAZY (*)    Protein, ur 30 (*)    Bacteria, UA MANY (*)    All other components within normal limits  IRON AND TIBC - Abnormal; Notable for the following components:   Iron 10 (*)    TIBC 201 (*)    Saturation Ratios 5 (*)  All other components within normal limits  RETICULOCYTES - Abnormal; Notable for the following components:   RBC. 3.49 (*)    All other components within normal limits  CULTURE, BLOOD (ROUTINE X 2)  CULTURE, BLOOD (ROUTINE X 2)  RESP PANEL BY RT-PCR (FLU A&B, COVID) ARPGX2  URINE CULTURE  LACTIC ACID, PLASMA  PROTIME-INR  APTT  VITAMIN B12  FOLATE  FERRITIN  LEGIONELLA PNEUMOPHILA SEROGP 1 UR AG  STREP PNEUMONIAE URINARY ANTIGEN  HIV ANTIBODY (ROUTINE TESTING W REFLEX)  MAGNESIUM  BASIC METABOLIC PANEL  CBC  TROPONIN I (HIGH SENSITIVITY)  TROPONIN I (HIGH SENSITIVITY)    EKG None  Radiology DG Chest Port 1 View  Result Date: 07/23/2021 CLINICAL DATA:  Question sepsis EXAM: PORTABLE CHEST 1 VIEW COMPARISON:  07/03/2021 FINDINGS: Interval development of right upper lobe airspace disease. Probable pneumonia based on the rapid development and symptoms. Left lung clear. No effusion identified. Heart size and vascularity normal. IMPRESSION: Right upper lobe infiltrate likely pneumonia. Followup PA and lateral chest X-ray is recommended in 3-4 weeks following trial of antibiotic therapy to ensure resolution and exclude underlying malignancy. Electronically Signed   By: Franchot Gallo M.D.   On: 07/23/2021 11:03    Procedures Procedures    Medications Ordered in ED Medications  cefTRIAXone (ROCEPHIN) 1 g in sodium chloride 0.9 % 100 mL IVPB (has no administration in time range)  azithromycin (ZITHROMAX) 500 mg in sodium chloride 0.9 % 250 mL IVPB (has no administration in time range)  aspirin EC tablet 81 mg (has no administration in time range)  estradiol (ESTRACE) vaginal cream 0.5 Applicatorful (has no  administration in time range)  loratadine (CLARITIN) tablet 10 mg (has no administration in time range)  enoxaparin (LOVENOX) injection 40 mg (has no administration in time range)  sodium chloride flush (NS) 0.9 % injection 3 mL (3 mLs Intravenous Given 07/23/21 1442)  sodium chloride flush (NS) 0.9 % injection 3 mL (has no administration in time range)  0.9 %  sodium chloride infusion (has no administration in time range)  acetaminophen (TYLENOL) tablet 650 mg (has no administration in time range)    Or  acetaminophen (TYLENOL) suppository 650 mg (has no administration in time range)  ondansetron (ZOFRAN) tablet 4 mg (has no administration in time range)    Or  ondansetron (ZOFRAN) injection 4 mg (has no administration in time range)  ipratropium-albuterol (DUONEB) 0.5-2.5 (3) MG/3ML nebulizer solution 3 mL (3 mLs Nebulization Given 07/23/21 1631)  budesonide (PULMICORT) nebulizer solution 0.25 mg (has no administration in time range)  guaiFENesin-dextromethorphan (ROBITUSSIN DM) 100-10 MG/5ML syrup 5 mL (has no administration in time range)  lactated ringers bolus 1,000 mL (1,000 mLs Intravenous New Bag/Given 07/23/21 1115)  cefTRIAXone (ROCEPHIN) 1 g in sodium chloride 0.9 % 100 mL IVPB (1 g Intravenous New Bag/Given 07/23/21 1211)  azithromycin (ZITHROMAX) 500 mg in sodium chloride 0.9 % 250 mL IVPB (0 mg Intravenous Stopped 07/23/21 1403)  potassium chloride SA (KLOR-CON M) CR tablet 40 mEq (40 mEq Oral Given 07/23/21 1658)    ED Course/ Medical Decision Making/ A&P Clinical Course as of 07/23/21 1842  Tue Jul 23, 2021  1107 Chest x-ray interpreted by me as right upper lobe infiltrate.  Waiting radiology reading. [MB]  1243 Discussed with [facility] hospital[facility] Dr. Manuella Ghazi who will evaluate the patient for admission. [MB]    Clinical Course User Index [MB] Hayden Rasmussen, MD  Medical Decision Making  This patient presents to the ED for concern of cough shortness of  breath fever, this involves an extensive number of treatment options, and is a complaint that carries with it a high risk of complications and morbidity.  The differential diagnosis includes pneumonia, COVID, flu, PE, pneumothorax, vascular, sepsis, Sirs   Additional history obtained:   External records from outside source obtained and reviewed including in epic including prior oncology and pulmonology visits in December   Lab Tests:  I Ordered, reviewed, and interpreted labs.  The pertinent results include: CBC with elevated white count, hemoglobin slightly lower than priors, chemistries with low sodium low potassium elevated creatinine, blood culture sent, troponin flat, COVID and flu negative, urinalysis without signs of infection, lactate not elevated   Imaging Studies ordered:  I ordered imaging studies including chest x-ray I independently visualized and interpreted imaging which showed right upper lobe pneumonia sinus tachycardia I agree with the radiologist interpretation   Cardiac Monitoring:  The patient was maintained on a cardiac monitor.  I personally viewed and interpreted the cardiac monitored which showed an underlying rhythm of: Sinus tachycardia   Medicines ordered and prescription drug management:  I ordered medication including IV fluids IV antibiotics duo nebs, oral potassium for patient's pneumonia and hypokalemia. Reevaluation of the patient after these medicines showed that the patient improved I have reviewed the patients home medicines and have made adjustments as needed   Critical Interventions:  None   Consultations Obtained:  I requested consultation with the hospitalist Dr. Manuella Ghazi,  and discussed lab and imaging findings as well as pertinent plan - they recommend: Antibiotics and admission   Problem List / ED Course:     Reevaluation:  After the interventions noted above, I reevaluated the patient and found that they have  :improved   Dispostion:  After consideration of the diagnostic results and the patients response to treatment feel that the patent would benefit from admission to the hospital for IV antibiotics and further management.  Patient agreeable to plan..          Final Clinical Impression(s) / ED Diagnoses Final diagnoses:  Community acquired pneumonia of right upper lobe of lung  Hypokalemia    Rx / DC Orders ED Discharge Orders     None         Hayden Rasmussen, MD 07/23/21 [phone]

## 2021-07-24 ENCOUNTER — Encounter (HOSPITAL_COMMUNITY): Payer: Self-pay | Admitting: Internal Medicine

## 2021-07-24 DIAGNOSIS — Z801 Family history of malignant neoplasm of trachea, bronchus and lung: Secondary | ICD-10-CM | POA: Diagnosis not present

## 2021-07-24 DIAGNOSIS — Z833 Family history of diabetes mellitus: Secondary | ICD-10-CM | POA: Diagnosis not present

## 2021-07-24 DIAGNOSIS — J189 Pneumonia, unspecified organism: Secondary | ICD-10-CM | POA: Diagnosis present

## 2021-07-24 DIAGNOSIS — B3731 Acute candidiasis of vulva and vagina: Secondary | ICD-10-CM | POA: Diagnosis present

## 2021-07-24 DIAGNOSIS — Z8249 Family history of ischemic heart disease and other diseases of the circulatory system: Secondary | ICD-10-CM | POA: Diagnosis not present

## 2021-07-24 DIAGNOSIS — D509 Iron deficiency anemia, unspecified: Secondary | ICD-10-CM | POA: Diagnosis present

## 2021-07-24 DIAGNOSIS — J440 Chronic obstructive pulmonary disease with acute lower respiratory infection: Secondary | ICD-10-CM | POA: Diagnosis present

## 2021-07-24 DIAGNOSIS — Z7982 Long term (current) use of aspirin: Secondary | ICD-10-CM | POA: Diagnosis not present

## 2021-07-24 DIAGNOSIS — E876 Hypokalemia: Secondary | ICD-10-CM | POA: Diagnosis present

## 2021-07-24 DIAGNOSIS — J45901 Unspecified asthma with (acute) exacerbation: Secondary | ICD-10-CM | POA: Diagnosis present

## 2021-07-24 DIAGNOSIS — Z20822 Contact with and (suspected) exposure to covid-19: Secondary | ICD-10-CM | POA: Diagnosis present

## 2021-07-24 DIAGNOSIS — Z79899 Other long term (current) drug therapy: Secondary | ICD-10-CM | POA: Diagnosis not present

## 2021-07-24 DIAGNOSIS — E7800 Pure hypercholesterolemia, unspecified: Secondary | ICD-10-CM | POA: Diagnosis present

## 2021-07-24 DIAGNOSIS — J9601 Acute respiratory failure with hypoxia: Secondary | ICD-10-CM | POA: Diagnosis not present

## 2021-07-24 DIAGNOSIS — E669 Obesity, unspecified: Secondary | ICD-10-CM | POA: Diagnosis present

## 2021-07-24 DIAGNOSIS — Z90710 Acquired absence of both cervix and uterus: Secondary | ICD-10-CM | POA: Diagnosis not present

## 2021-07-24 DIAGNOSIS — Z7951 Long term (current) use of inhaled steroids: Secondary | ICD-10-CM | POA: Diagnosis not present

## 2021-07-24 DIAGNOSIS — F17210 Nicotine dependence, cigarettes, uncomplicated: Secondary | ICD-10-CM | POA: Diagnosis present

## 2021-07-24 DIAGNOSIS — Z808 Family history of malignant neoplasm of other organs or systems: Secondary | ICD-10-CM | POA: Diagnosis not present

## 2021-07-24 DIAGNOSIS — Z803 Family history of malignant neoplasm of breast: Secondary | ICD-10-CM | POA: Diagnosis not present

## 2021-07-24 DIAGNOSIS — Z6831 Body mass index (BMI) 31.0-31.9, adult: Secondary | ICD-10-CM | POA: Diagnosis not present

## 2021-07-24 DIAGNOSIS — Z806 Family history of leukemia: Secondary | ICD-10-CM | POA: Diagnosis not present

## 2021-07-24 LAB — BASIC METABOLIC PANEL
Anion gap: 8 (ref 5–15)
BUN: 23 mg/dL (ref 8–23)
CO2: 22 mmol/L (ref 22–32)
Calcium: 7.7 mg/dL — ABNORMAL LOW (ref 8.9–10.3)
Chloride: 101 mmol/L (ref 98–111)
Creatinine, Ser: 0.99 mg/dL (ref 0.44–1.00)
GFR, Estimated: >60 mL/min (ref >60)
Glucose, Bld: 124 mg/dL — ABNORMAL HIGH (ref 70–99)
Potassium: 3.3 mmol/L — ABNORMAL LOW (ref 3.5–5.1)
Sodium: 131 mmol/L — ABNORMAL LOW (ref 135–145)

## 2021-07-24 LAB — CBC
HCT: 30.3 % — ABNORMAL LOW (ref 36.0–46.0)
Hemoglobin: 10.2 g/dL — ABNORMAL LOW (ref 12.0–15.0)
MCH: 31.3 pg (ref 26.0–34.0)
MCHC: 33.7 g/dL (ref 30.0–36.0)
MCV: 92.9 fL (ref 80.0–100.0)
Platelets: 209 10*3/uL (ref 150–400)
RBC: 3.26 MIL/uL — ABNORMAL LOW (ref 3.87–5.11)
RDW: 13.2 % (ref 11.5–15.5)
WBC: 17.3 10*3/uL — ABNORMAL HIGH (ref 4.0–10.5)
nRBC: 0 % (ref 0.0–0.2)

## 2021-07-24 LAB — MAGNESIUM: Magnesium: 2.4 mg/dL (ref 1.7–2.4)

## 2021-07-24 LAB — STREP PNEUMONIAE URINARY ANTIGEN: Strep Pneumo Urinary Antigen: NEGATIVE

## 2021-07-24 MED ORDER — POTASSIUM CHLORIDE CRYS ER 20 MEQ PO TBCR
40.0000 meq | EXTENDED_RELEASE_TABLET | Freq: Once | ORAL | Status: AC
  Administered 2021-07-24: 40 meq via ORAL
  Filled 2021-07-24: qty 2

## 2021-07-24 NOTE — Progress Notes (Addendum)
Progress Note    [name]  UVO:[id number] DOB: [birth year]  DOA: 07/23/2021 PCP: Redmond School, MD      Brief Narrative:    Medical records reviewed and are as summarized below:  [name] is a 66 y.o. female with medical history significant for asthma, pneumonia, tobacco use disorder, diverticulosis, who presented to the hospital with fever, dry cough, right-sided chest pain and shortness of breath.      Assessment/Plan:   Principal Problem:   Acute hypoxemic respiratory failure (HCC) Active Problems:   CAP (community acquired pneumonia)     Body mass index is 31.32 kg/m.  (Obesity)  Community-acquired right-sided pneumonia, leukocytosis: Continue empiric IV antibiotics.  Acute hypoxemic respiratory failure: Continue 3 L/min oxygen via nasal cannula.  Taper off oxygen as able.  Hypokalemia: Replete potassium and monitor levels.  Diet Order             Diet regular Room service appropriate? Yes; Fluid consistency: Thin  Diet effective now                      Consultants: None  Procedures: None    Medications:    aspirin EC  81 mg Oral QODAY   budesonide (PULMICORT) nebulizer solution  0.25 mg Nebulization BID   enoxaparin (LOVENOX) injection  40 mg Subcutaneous Q24H   estradiol  0.5 Applicatorful Vaginal V42V   ipratropium-albuterol  3 mL Nebulization TID   loratadine  10 mg Oral Daily   sodium chloride flush  3 mL Intravenous Q12H   Continuous Infusions:  sodium chloride Stopped (07/24/21 1304)   azithromycin 250 mL/hr at 07/24/21 1305     Anti-infectives (From admission, onward)    Start     Dose/Rate Route Frequency Ordered Stop   07/24/21 1300  cefTRIAXone (ROCEPHIN) 1 g in sodium chloride 0.9 % 100 mL IVPB        1 g 200 mL/hr over 30 Minutes Intravenous  Once 07/23/21 1406 07/24/21 1258   07/24/21 1300  azithromycin (ZITHROMAX) 500 mg in sodium chloride 0.9 % 250 mL IVPB        500 mg 250 mL/hr over  60 Minutes Intravenous Every 24 hours 07/23/21 1406     07/23/21 1115  cefTRIAXone (ROCEPHIN) 1 g in sodium chloride 0.9 % 100 mL IVPB        1 g 200 mL/hr over 30 Minutes Intravenous  Once 07/23/21 1107 07/23/21 1311   07/23/21 1115  azithromycin (ZITHROMAX) 500 mg in sodium chloride 0.9 % 250 mL IVPB        500 mg 250 mL/hr over 60 Minutes Intravenous  Once 07/23/21 1107 07/23/21 1403              Family Communication/Anticipated D/C date and plan/Code Status   DVT prophylaxis: enoxaparin (LOVENOX) injection 40 mg Start: 07/23/21 2200     Code Status: Full Code  Family Communication: None Disposition Plan: Possible discharge to home in 1 to 2 days   Status is: Inpatient  Remains inpatient appropriate because: IV antibiotics, hypoxia           Subjective:   C/o cough and shortness of breath with minimal exertion.  2 CNAs were at the bedside.  Objective:    Vitals:   07/23/21 2103 07/24/21 0454 07/24/21 0742 07/24/21 1340  BP:  (!) 106/59    Pulse:  92    Resp:  16    Temp:  99.5 F (  37.5 C)    TempSrc:  Oral    SpO2: 90% 92% 95% 96%  Weight:      Height:       No data found.   Intake/Output Summary (Last 24 hours) at 07/24/2021 1415 Last data filed at 07/24/2021 1305 Gross per 24 hour  Intake 346.69 ml  Output --  Net 346.69 ml   Filed Weights   07/23/21 1013  Weight: 90.7 kg    Exam:  GEN: NAD SKIN: No rash EYES: EOMI ENT: MMM CV: RRR PULM: Right basilar rales or rhonchi ABD: soft, ND, NT, +BS CNS: AAO x 3, non focal EXT: No edema or tenderness        Data Reviewed:   I have personally reviewed following labs and imaging studies:  Labs: Labs show the following:   Basic Metabolic Panel: Recent Labs  Lab 07/23/21 1115 07/24/21 0539  NA 131* 131*  K 3.1* 3.3*  CL 98 101  CO2 22 22  GLUCOSE 145* 124*  BUN 25* 23  CREATININE 1.05* 0.99  CALCIUM 7.9* 7.7*  MG  --  2.4   GFR Estimated Creatinine Clearance: 63.7  mL/min (by C-G formula based on SCr of 0.99 mg/dL). Liver Function Tests: Recent Labs  Lab 07/23/21 1115  AST 29  ALT 25  ALKPHOS 106  BILITOT 2.0*  PROT 6.4*  ALBUMIN 2.7*   No results for input(s): LIPASE, AMYLASE in the last 168 hours. No results for input(s): AMMONIA in the last 168 hours. Coagulation profile Recent Labs  Lab 07/23/21 1115  INR 1.2    CBC: Recent Labs  Lab 07/23/21 1115 07/24/21 0539  WBC 16.7* 17.3*  NEUTROABS 13.7*  --   HGB 11.1* 10.2*  HCT 33.0* 30.3*  MCV 92.7 92.9  PLT 202 209   Cardiac Enzymes: No results for input(s): CKTOTAL, CKMB, CKMBINDEX, TROPONINI in the last 168 hours. BNP (last 3 results) No results for input(s): PROBNP in the last 8760 hours. CBG: No results for input(s): GLUCAP in the last 168 hours. D-Dimer: No results for input(s): DDIMER in the last 72 hours. Hgb A1c: No results for input(s): HGBA1C in the last 72 hours. Lipid Profile: No results for input(s): CHOL, HDL, LDLCALC, TRIG, CHOLHDL, LDLDIRECT in the last 72 hours. Thyroid function studies: No results for input(s): TSH, T4TOTAL, T3FREE, THYROIDAB in the last 72 hours.  Invalid input(s): FREET3 Anemia work up: Recent Labs    07/23/21 1115  VITAMINB12 256  FOLATE 23.3  FERRITIN 265  TIBC 201*  IRON 10*  RETICCTPCT 1.2   Sepsis Labs: Recent Labs  Lab 07/23/21 1115 07/24/21 0539  WBC 16.7* 17.3*  LATICACIDVEN 1.9  --     Microbiology Recent Results (from the past 240 hour(s))  Blood Culture (routine x 2)     Status: None (Preliminary result)   Collection Time: 07/23/21 11:03 AM   Specimen: Right Antecubital; Blood  Result Value Ref Range Status   Specimen Description RIGHT ANTECUBITAL  Final   Special Requests   Final    BOTTLES DRAWN AEROBIC AND ANAEROBIC Blood Culture adequate volume   Culture   Final    NO GROWTH < 24 HOURS Performed at [facility], [address], [place], [place] [address]    Report Status PENDING  Incomplete   Blood Culture (routine x 2)     Status: None (Preliminary result)   Collection Time: 07/23/21 11:15 AM   Specimen: Site Not Specified; Blood  Result Value Ref Range Status  Specimen Description SITE NOT SPECIFIED  Final   Special Requests   Final    BOTTLES DRAWN AEROBIC AND ANAEROBIC Blood Culture results may not be optimal due to an excessive volume of blood received in culture bottles   Culture   Final    NO GROWTH < 24 HOURS Performed at [facility], [address], [place], [place] [address]    Report Status PENDING  Incomplete  Resp Panel by RT-PCR (Flu A&B, Covid) Nasopharyngeal Swab     Status: None   Collection Time: 07/23/21 11:20 AM   Specimen: Nasopharyngeal Swab; Nasopharyngeal(NP) swabs in vial transport medium  Result Value Ref Range Status   SARS Coronavirus 2 by RT PCR NEGATIVE NEGATIVE Final    Comment: (NOTE) SARS-CoV-2 target nucleic acids are NOT DETECTED.  The SARS-CoV-2 RNA is generally detectable in upper respiratory specimens during the acute phase of infection. The lowest concentration of SARS-CoV-2 viral copies this assay can detect is 138 copies/mL. A negative result does not preclude SARS-Cov-2 infection and should not be used as the sole basis for treatment or other patient management decisions. A negative result may occur with  improper specimen collection/handling, submission of specimen other than nasopharyngeal swab, presence of viral mutation(s) within the areas targeted by this assay, and inadequate number of viral copies(<138 copies/mL). A negative result must be combined with clinical observations, patient history, and epidemiological information. The expected result is Negative.  Fact Sheet for Patients:  EntrepreneurPulse.com.au  Fact Sheet for Healthcare Providers:  IncredibleEmployment.be  This test is no t yet approved or cleared by the [place] FDA and  has been authorized for detection  and/or diagnosis of SARS-CoV-2 by FDA under an Emergency Use Authorization (EUA). This EUA will remain  in effect (meaning this test can be used) for the duration of the COVID-19 declaration under Section 564(b)(1) of the Act, 21 U.S.C.section 360bbb-3(b)(1), unless the authorization is terminated  or revoked sooner.       Influenza A by PCR NEGATIVE NEGATIVE Final   Influenza B by PCR NEGATIVE NEGATIVE Final    Comment: (NOTE) The Xpert Xpress SARS-CoV-2/FLU/RSV plus assay is intended as an aid in the diagnosis of influenza from Nasopharyngeal swab specimens and should not be used as a sole basis for treatment. Nasal washings and aspirates are unacceptable for Xpert Xpress SARS-CoV-2/FLU/RSV testing.  Fact Sheet for Patients: EntrepreneurPulse.com.au  Fact Sheet for Healthcare Providers: IncredibleEmployment.be  This test is not yet approved or cleared by the [place] FDA and has been authorized for detection and/or diagnosis of SARS-CoV-2 by FDA under an Emergency Use Authorization (EUA). This EUA will remain in effect (meaning this test can be used) for the duration of the COVID-19 declaration under Section 564(b)(1) of the Act, 21 U.S.C. section 360bbb-3(b)(1), unless the authorization is terminated or revoked.  Performed at [facility], [address] [place],  [address]     Procedures and diagnostic studies:  DG Chest [facility] 1 View  Result Date: 07/23/2021 CLINICAL DATA:  Question sepsis EXAM: PORTABLE CHEST 1 VIEW COMPARISON:  07/03/2021 FINDINGS: Interval development of right upper lobe airspace disease. Probable pneumonia based on the rapid development and symptoms. Left lung clear. No effusion identified. Heart size and vascularity normal. IMPRESSION: Right upper lobe infiltrate likely pneumonia. Followup PA and lateral chest X-ray is recommended in 3-4 weeks following trial of antibiotic therapy to ensure resolution and  exclude underlying malignancy. Electronically Signed   By: Franchot Gallo M.D.   On: 07/23/2021 11:03  LOS: 0 days      [facility]   Pager on [email or web address]. If 7PM-7AM, please contact night-coverage at [email or web address]     07/24/2021, 2:15 PM

## 2021-07-24 NOTE — Telephone Encounter (Signed)
I am sorry that you are not feeling well. I would advise to get tested for COVID and Flu if not done already. We can arrange a acute video visit with one of the APPs.  Agree with staying well hydrated. Go to ED if there is worsening symptoms

## 2021-07-24 NOTE — Plan of Care (Signed)
°  Problem: Education: Goal: Knowledge of General Education information will improve Description: Including pain rating scale, medication(s)/side effects and non-pharmacologic comfort measures Outcome: Progressing   Problem: Health Behavior/Discharge Planning: Goal: Ability to manage health-related needs will improve Outcome: Progressing   Problem: Clinical Measurements: Goal: Will remain free from infection Outcome: Progressing   Problem: Nutrition: Goal: Adequate nutrition will be maintained Outcome: Progressing   Problem: Coping: Goal: Level of anxiety will decrease Outcome: Progressing   Problem: Elimination: Goal: Will not experience complications related to bowel motility Outcome: Progressing Goal: Will not experience complications related to urinary retention Outcome: Progressing   Problem: Pain Managment: Goal: General experience of comfort will improve Outcome: Progressing   Problem: Safety: Goal: Ability to remain free from injury will improve Outcome: Progressing

## 2021-07-24 NOTE — TOC Progression Note (Signed)
Transition of Care [facility]) - Progression Note    Patient Details  Name: [name] MRN: [id number] Date of Birth: 04-03-1956  Transition of Care [facility]) CM/SW Contact  Salome Arnt, [place] Phone Number: 07/24/2021, 11:00 AM  Clinical Narrative:   Transition of Care [facility]) Screening Note   Patient Details  Name: [name] Date of Birth: 02-Sep-1955   Transition of Care [facility]) CM/SW Contact:    Salome Arnt, [place] Phone Number: 07/24/2021, 11:00 AM    Transition of Care Department [facility]) has reviewed patient and no TOC needs have been identified at this time. We will continue to monitor patient advancement through interdisciplinary progression rounds. If new patient transition needs arise, please place a TOC consult.         Barriers to Discharge: Continued Medical Work up  Expected Discharge Plan and Services                                                 Social Determinants of Health (SDOH) Interventions    Readmission Risk Interventions No flowsheet data found.

## 2021-07-25 LAB — CBC WITH DIFFERENTIAL/PLATELET
Abs Immature Granulocytes: 0.67 10*3/uL — ABNORMAL HIGH (ref 0.00–0.07)
Basophils Absolute: 0.1 10*3/uL (ref 0.0–0.1)
Basophils Relative: 0 %
Eosinophils Absolute: 0.1 10*3/uL (ref 0.0–0.5)
Eosinophils Relative: 1 %
HCT: 28.8 % — ABNORMAL LOW (ref 36.0–46.0)
Hemoglobin: 9.5 g/dL — ABNORMAL LOW (ref 12.0–15.0)
Immature Granulocytes: 4 %
Lymphocytes Relative: 12 %
Lymphs Abs: 1.8 10*3/uL (ref 0.7–4.0)
MCH: 31 pg (ref 26.0–34.0)
MCHC: 33 g/dL (ref 30.0–36.0)
MCV: 94.1 fL (ref 80.0–100.0)
Monocytes Absolute: 1.6 10*3/uL — ABNORMAL HIGH (ref 0.1–1.0)
Monocytes Relative: 11 %
Neutro Abs: 11.3 10*3/uL — ABNORMAL HIGH (ref 1.7–7.7)
Neutrophils Relative %: 72 %
Platelets: 226 10*3/uL (ref 150–400)
RBC: 3.06 MIL/uL — ABNORMAL LOW (ref 3.87–5.11)
RDW: 13.5 % (ref 11.5–15.5)
WBC: 15.6 10*3/uL — ABNORMAL HIGH (ref 4.0–10.5)
nRBC: 0 % (ref 0.0–0.2)

## 2021-07-25 LAB — URINE CULTURE: Culture: 20000 — AB

## 2021-07-25 LAB — BASIC METABOLIC PANEL
Anion gap: 7 (ref 5–15)
BUN: 20 mg/dL (ref 8–23)
CO2: 24 mmol/L (ref 22–32)
Calcium: 8.2 mg/dL — ABNORMAL LOW (ref 8.9–10.3)
Chloride: 103 mmol/L (ref 98–111)
Creatinine, Ser: 0.81 mg/dL (ref 0.44–1.00)
GFR, Estimated: >60 mL/min (ref >60)
Glucose, Bld: 109 mg/dL — ABNORMAL HIGH (ref 70–99)
Potassium: 4 mmol/L (ref 3.5–5.1)
Sodium: 134 mmol/L — ABNORMAL LOW (ref 135–145)

## 2021-07-25 LAB — MAGNESIUM: Magnesium: 2.5 mg/dL — ABNORMAL HIGH (ref 1.7–2.4)

## 2021-07-25 LAB — LEGIONELLA PNEUMOPHILA SEROGP 1 UR AG: L. pneumophila Serogp 1 Ur Ag: NEGATIVE

## 2021-07-25 MED ORDER — FERROUS SULFATE 325 (65 FE) MG PO TABS
325.0000 mg | ORAL_TABLET | ORAL | Status: DC
  Administered 2021-07-25: 325 mg via ORAL
  Filled 2021-07-25 (×2): qty 1

## 2021-07-25 NOTE — Progress Notes (Signed)
Progress Note    [name]  ZOX:[id number] DOB: 1956/03/17  DOA: 07/23/2021 PCP: Redmond School, MD      Brief Narrative:    Medical records reviewed and are as summarized below:  [name] is a 66 y.o. female with medical history significant for asthma, pneumonia, tobacco use disorder, diverticulosis, who presented to the hospital with fever, dry cough, right-sided chest pain and shortness of breath.      Assessment/Plan:   Principal Problem:   Acute hypoxemic respiratory failure (HCC) Active Problems:   CAP (community acquired pneumonia)     Body mass index is 31.32 kg/m.  (Obesity)  Community-acquired right-sided pneumonia, leukocytosis: Continue IV ceftriaxone and azithromycin.  No growth on blood cultures thus far.  Acute hypoxemic respiratory failure: Continue 2.5 L/min oxygen via nasal cannula.  Wean off oxygen and check oxygen saturation with ambulation.  Hypokalemia: Improved  Iron deficiency anemia: Start ferrous sulfate.  Follow-up with PCP in the outpatient setting for further evaluation.  Diet Order             Diet regular Room service appropriate? Yes; Fluid consistency: Thin  Diet effective now                      Consultants: None  Procedures: None    Medications:    aspirin EC  81 mg Oral QODAY   budesonide (PULMICORT) nebulizer solution  0.25 mg Nebulization BID   enoxaparin (LOVENOX) injection  40 mg Subcutaneous Q24H   estradiol  0.5 Applicatorful Vaginal W11B   ipratropium-albuterol  3 mL Nebulization TID   loratadine  10 mg Oral Daily   sodium chloride flush  3 mL Intravenous Q12H   Continuous Infusions:  sodium chloride Stopped (07/24/21 1304)   azithromycin 250 mL/hr at 07/24/21 1305     Anti-infectives (From admission, onward)    Start     Dose/Rate Route Frequency Ordered Stop   07/24/21 1300  cefTRIAXone (ROCEPHIN) 1 g in sodium chloride 0.9 % 100 mL IVPB        1 g 200 mL/hr over  30 Minutes Intravenous  Once 07/23/21 1406 07/24/21 1258   07/24/21 1300  azithromycin (ZITHROMAX) 500 mg in sodium chloride 0.9 % 250 mL IVPB        500 mg 250 mL/hr over 60 Minutes Intravenous Every 24 hours 07/23/21 1406     07/23/21 1115  cefTRIAXone (ROCEPHIN) 1 g in sodium chloride 0.9 % 100 mL IVPB        1 g 200 mL/hr over 30 Minutes Intravenous  Once 07/23/21 1107 07/23/21 1311   07/23/21 1115  azithromycin (ZITHROMAX) 500 mg in sodium chloride 0.9 % 250 mL IVPB        500 mg 250 mL/hr over 60 Minutes Intravenous  Once 07/23/21 1107 07/23/21 1403              Family Communication/Anticipated D/C date and plan/Code Status   DVT prophylaxis: enoxaparin (LOVENOX) injection 40 mg Start: 07/23/21 2200     Code Status: Full Code  Family Communication: None Disposition Plan: Possible discharge to home tomorrow   Status is: Inpatient  Remains inpatient appropriate because: IV antibiotics, hypoxia           Subjective:   C/o cough and shortness of breath.  She said she feels short of breath when she gets up to the bedside commode without oxygen.  Objective:    Vitals:   07/24/21  1959 07/24/21 2031 07/25/21 0337 07/25/21 0704  BP:  114/61 118/79   Pulse:  100 94   Resp:  20 (!) 22   Temp:  99.1 F (37.3 C) 98 F (36.7 C)   TempSrc:  Temporal    SpO2: 95% 93% 97% 98%  Weight:      Height:       No data found.   Intake/Output Summary (Last 24 hours) at 07/25/2021 1155 Last data filed at 07/25/2021 0900 Gross per 24 hour  Intake 1672.24 ml  Output --  Net 1672.24 ml   Filed Weights   07/23/21 1013  Weight: 90.7 kg    Exam:  GEN: NAD SKIN: No rash EYES: EOMI ENT: MMM CV: RRR PULM: Right basilar rales and rhonchi ABD: soft, ND, NT, +BS CNS: AAO x 3, non focal EXT: No edema or tenderness       Data Reviewed:   I have personally reviewed following labs and imaging studies:  Labs: Labs show the following:   Basic Metabolic  Panel: Recent Labs  Lab 07/23/21 1115 07/24/21 0539 07/25/21 0453  NA 131* 131* 134*  K 3.1* 3.3* 4.0  CL 98 101 103  CO2 22 22 24   GLUCOSE 145* 124* 109*  BUN 25* 23 20  CREATININE 1.05* 0.99 0.81  CALCIUM 7.9* 7.7* 8.2*  MG  --  2.4 2.5*   GFR Estimated Creatinine Clearance: 77.9 mL/min (by C-G formula based on SCr of 0.81 mg/dL). Liver Function Tests: Recent Labs  Lab 07/23/21 1115  AST 29  ALT 25  ALKPHOS 106  BILITOT 2.0*  PROT 6.4*  ALBUMIN 2.7*   No results for input(s): LIPASE, AMYLASE in the last 168 hours. No results for input(s): AMMONIA in the last 168 hours. Coagulation profile Recent Labs  Lab 07/23/21 1115  INR 1.2    CBC: Recent Labs  Lab 07/23/21 1115 07/24/21 0539 07/25/21 0453  WBC 16.7* 17.3* 15.6*  NEUTROABS 13.7*  --  11.3*  HGB 11.1* 10.2* 9.5*  HCT 33.0* 30.3* 28.8*  MCV 92.7 92.9 94.1  PLT 202 209 226   Cardiac Enzymes: No results for input(s): CKTOTAL, CKMB, CKMBINDEX, TROPONINI in the last 168 hours. BNP (last 3 results) No results for input(s): PROBNP in the last 8760 hours. CBG: No results for input(s): GLUCAP in the last 168 hours. D-Dimer: No results for input(s): DDIMER in the last 72 hours. Hgb A1c: No results for input(s): HGBA1C in the last 72 hours. Lipid Profile: No results for input(s): CHOL, HDL, LDLCALC, TRIG, CHOLHDL, LDLDIRECT in the last 72 hours. Thyroid function studies: No results for input(s): TSH, T4TOTAL, T3FREE, THYROIDAB in the last 72 hours.  Invalid input(s): FREET3 Anemia work up: Recent Labs    07/23/21 1115  VITAMINB12 256  FOLATE 23.3  FERRITIN 265  TIBC 201*  IRON 10*  RETICCTPCT 1.2   Sepsis Labs: Recent Labs  Lab 07/23/21 1115 07/24/21 0539 07/25/21 0453  WBC 16.7* 17.3* 15.6*  LATICACIDVEN 1.9  --   --     Microbiology Recent Results (from the past 240 hour(s))  Blood Culture (routine x 2)     Status: None (Preliminary result)   Collection Time: 07/23/21 11:03 AM    Specimen: Right Antecubital; Blood  Result Value Ref Range Status   Specimen Description RIGHT ANTECUBITAL  Final   Special Requests   Final    BOTTLES DRAWN AEROBIC AND ANAEROBIC Blood Culture adequate volume   Culture   Final    NO  GROWTH 2 DAYS Performed at [facility], [address], [place], [place] [address]    Report Status PENDING  Incomplete  Blood Culture (routine x 2)     Status: None (Preliminary result)   Collection Time: 07/23/21 11:15 AM   Specimen: Site Not Specified; Blood  Result Value Ref Range Status   Specimen Description SITE NOT SPECIFIED  Final   Special Requests   Final    BOTTLES DRAWN AEROBIC AND ANAEROBIC Blood Culture results may not be optimal due to an excessive volume of blood received in culture bottles   Culture   Final    NO GROWTH 2 DAYS Performed at [facility], [address] [place], [place] [address]    Report Status PENDING  Incomplete  Resp Panel by RT-PCR (Flu A&B, Covid) Nasopharyngeal Swab     Status: None   Collection Time: 07/23/21 11:20 AM   Specimen: Nasopharyngeal Swab; Nasopharyngeal(NP) swabs in vial transport medium  Result Value Ref Range Status   SARS Coronavirus 2 by RT PCR NEGATIVE NEGATIVE Final    Comment: (NOTE) SARS-CoV-2 target nucleic acids are NOT DETECTED.  The SARS-CoV-2 RNA is generally detectable in upper respiratory specimens during the acute phase of infection. The lowest concentration of SARS-CoV-2 viral copies this assay can detect is 138 copies/mL. A negative result does not preclude SARS-Cov-2 infection and should not be used as the sole basis for treatment or other patient management decisions. A negative result may occur with  improper specimen collection/handling, submission of specimen other than nasopharyngeal swab, presence of viral mutation(s) within the areas targeted by this assay, and inadequate number of viral copies(<138 copies/mL). A negative result must be combined with clinical  observations, patient history, and epidemiological information. The expected result is Negative.  Fact Sheet for Patients:  EntrepreneurPulse.com.au  Fact Sheet for Healthcare Providers:  IncredibleEmployment.be  This test is no t yet approved or cleared by the [place] FDA and  has been authorized for detection and/or diagnosis of SARS-CoV-2 by FDA under an Emergency Use Authorization (EUA). This EUA will remain  in effect (meaning this test can be used) for the duration of the COVID-19 declaration under Section 564(b)(1) of the Act, 21 U.S.C.section 360bbb-3(b)(1), unless the authorization is terminated  or revoked sooner.       Influenza A by PCR NEGATIVE NEGATIVE Final   Influenza B by PCR NEGATIVE NEGATIVE Final    Comment: (NOTE) The Xpert Xpress SARS-CoV-2/FLU/RSV plus assay is intended as an aid in the diagnosis of influenza from Nasopharyngeal swab specimens and should not be used as a sole basis for treatment. Nasal washings and aspirates are unacceptable for Xpert Xpress SARS-CoV-2/FLU/RSV testing.  Fact Sheet for Patients: EntrepreneurPulse.com.au  Fact Sheet for Healthcare Providers: IncredibleEmployment.be  This test is not yet approved or cleared by the [place] FDA and has been authorized for detection and/or diagnosis of SARS-CoV-2 by FDA under an Emergency Use Authorization (EUA). This EUA will remain in effect (meaning this test can be used) for the duration of the COVID-19 declaration under Section 564(b)(1) of the Act, 21 U.S.C. section 360bbb-3(b)(1), unless the authorization is terminated or revoked.  Performed at [facility], [address], [place], [place] [address]   Urine Culture     Status: Abnormal   Collection Time: 07/23/21  2:00 PM   Specimen: Urine, Catheterized  Result Value Ref Range Status   Specimen Description   Final    URINE, CATHETERIZED Performed  at [facility], [address] [place], [place] [address]    Special Requests   Final    NONE Performed at [facility], [address] [place], [place] [address]    Culture (A)  Final    20,000 COLONIES/mL DIPHTHEROIDS(CORYNEBACTERIUM SPECIES) Standardized susceptibility testing for this organism is not available. Performed at [facility], [place] [address], [place], [place] [address]    Report Status 07/25/2021 FINAL  Final    Procedures and diagnostic studies:  No results found.             LOS: 1 day      [facility]   Pager on [email or web address]. If 7PM-7AM, please contact night-coverage at [email or web address]     07/25/2021, 11:55 AM

## 2021-07-26 ENCOUNTER — Telehealth: Payer: Self-pay | Admitting: Pulmonary Disease

## 2021-07-26 LAB — CBC WITH DIFFERENTIAL/PLATELET
Basophils Absolute: 0 10*3/uL (ref 0.0–0.1)
Basophils Relative: 0 %
Eosinophils Absolute: 0 10*3/uL (ref 0.0–0.5)
Eosinophils Relative: 0 %
HCT: 31.4 % — ABNORMAL LOW (ref 36.0–46.0)
Hemoglobin: 10.5 g/dL — ABNORMAL LOW (ref 12.0–15.0)
Lymphocytes Relative: 19 %
Lymphs Abs: 2.7 10*3/uL (ref 0.7–4.0)
MCH: 31.4 pg (ref 26.0–34.0)
MCHC: 33.4 g/dL (ref 30.0–36.0)
MCV: 94 fL (ref 80.0–100.0)
Metamyelocytes Relative: 5 %
Monocytes Absolute: 0.6 10*3/uL (ref 0.1–1.0)
Monocytes Relative: 4 %
Myelocytes: 4 %
Neutro Abs: 9.4 10*3/uL — ABNORMAL HIGH (ref 1.7–7.7)
Neutrophils Relative %: 67 %
Platelets: 325 10*3/uL (ref 150–400)
Promyelocytes Relative: 1 %
RBC: 3.34 MIL/uL — ABNORMAL LOW (ref 3.87–5.11)
RDW: 13.5 % (ref 11.5–15.5)
WBC: 14 10*3/uL — ABNORMAL HIGH (ref 4.0–10.5)
nRBC: 0 % (ref 0.0–0.2)

## 2021-07-26 MED ORDER — FERROUS SULFATE 325 (65 FE) MG PO TABS: 325.0000 mg | ORAL_TABLET | ORAL | 0 refills | Status: DC

## 2021-07-26 MED ORDER — AMOXICILLIN-POT CLAVULANATE 875-125 MG PO TABS: 1.0000 | ORAL_TABLET | Freq: Two times a day (BID) | ORAL | 0 refills | Status: AC

## 2021-07-26 MED ORDER — FLUCONAZOLE 150 MG PO TABS
150.0000 mg | ORAL_TABLET | Freq: Once | ORAL | 0 refills | Status: AC
Start: 2021-07-26 — End: 2021-07-26

## 2021-07-26 MED ORDER — AMOXICILLIN-POT CLAVULANATE 875-125 MG PO TABS
1.0000 | ORAL_TABLET | Freq: Two times a day (BID) | ORAL | Status: DC
  Administered 2021-07-26: 1 via ORAL
  Filled 2021-07-26: qty 1

## 2021-07-26 MED ORDER — AZITHROMYCIN 250 MG PO TABS
500.0000 mg | ORAL_TABLET | Freq: Once | ORAL | Status: AC
  Administered 2021-07-26: 500 mg via ORAL
  Filled 2021-07-26: qty 2

## 2021-07-26 MED ORDER — FLUCONAZOLE 150 MG PO TABS
150.0000 mg | ORAL_TABLET | Freq: Once | ORAL | Status: AC
  Administered 2021-07-26: 150 mg via ORAL
  Filled 2021-07-26: qty 1

## 2021-07-26 NOTE — Progress Notes (Signed)
SATURATION QUALIFICATIONS: (This note is used to comply with regulatory documentation for home oxygen)  Patient Saturations on Room Air at Rest = 93%  Patient Saturations on Room Air while Ambulating = 89%  Patient Saturations on 3 Liters of oxygen while Ambulating = 91-93%  Please briefly explain why patient needs home oxygen:

## 2021-07-26 NOTE — Care Management Important Message (Signed)
Important Message  Patient Details  Name: [name] MRN: [id number] Date of Birth: [birth year]   Medicare Important Message Given:  Yes     Tommy Medal 07/26/2021, 3:27 PM

## 2021-07-26 NOTE — Discharge Summary (Addendum)
Physician Discharge Summary  [name] LXB:[id number] DOB: 1955/11/30 DOA: 07/23/2021  PCP: Redmond School, MD  Admit date: 07/23/2021 Discharge date: 07/26/2021  Discharge disposition: Home   Recommendations for Outpatient Follow-Up:   Follow-up with PCP in 1 to 2 weeks  Discharge Diagnosis:   Principal Problem:   Acute hypoxemic respiratory failure ([place]) Active Problems:   CAP (community acquired pneumonia)    Discharge Condition: Stable.  Diet recommendation:  Diet Order             Diet - low sodium heart healthy           Diet regular Room service appropriate? Yes; Fluid consistency: Thin  Diet effective now                     Code Status: Full Code     Hospital Course:   Ms. [name] is a 66 y.o. female with medical history significant for asthma, COPD, pneumonia, tobacco use disorder, diverticulosis, who presented to the hospital with fever, dry cough, right-sided chest pain and shortness of breath.   She was found to have community-acquired pneumonia complicated by acute hypoxemic respiratory failure.  She was treated with empiric IV antibiotics and IV fluids.  She also required up to 3 L/min oxygen via nasal cannula.  She had hypokalemia that was repleted.  She was given oral fluconazole for vulvovaginal candidiasis.  She was successfully weaned off of oxygen and she does not meet criteria for home oxygen.  Her condition has improved and she is deemed stable for discharge to home today.     Discharge Exam:    Vitals:   07/26/21 0814 07/26/21 0908 07/26/21 1245 07/26/21 1448  BP: 124/71 113/80 132/69   Pulse: 89 78 77   Resp: 18  16   Temp: 98.2 F (36.8 C) 98.3 F (36.8 C) 98.4 F (36.9 C)   TempSrc: Oral Oral    SpO2: 93% (!) 86% 98% 97%  Weight:      Height:         GEN: NAD SKIN: Warm and dry EYES: No pallor or icterus ENT: MMM CV: RRR PULM: Rhonchi heard in the lower zone of the right lung ABD: soft, ND, NT,  +BS CNS: AAO x 3, non focal EXT: No edema or tenderness   The results of significant diagnostics from this hospitalization (including imaging, microbiology, ancillary and laboratory) are listed below for reference.     Procedures and Diagnostic Studies:   DG Chest Port 1 View  Result Date: 07/23/2021 CLINICAL DATA:  Question sepsis EXAM: PORTABLE CHEST 1 VIEW COMPARISON:  07/03/2021 FINDINGS: Interval development of right upper lobe airspace disease. Probable pneumonia based on the rapid development and symptoms. Left lung clear. No effusion identified. Heart size and vascularity normal. IMPRESSION: Right upper lobe infiltrate likely pneumonia. Followup PA and lateral chest X-ray is recommended in 3-4 weeks following trial of antibiotic therapy to ensure resolution and exclude underlying malignancy. Electronically Signed   By: Franchot Gallo M.D.   On: 07/23/2021 11:03     Labs:   Basic Metabolic Panel: Recent Labs  Lab 07/23/21 1115 07/24/21 0539 07/25/21 0453  NA 131* 131* 134*  K 3.1* 3.3* 4.0  CL 98 101 103  CO2 22 22 24   GLUCOSE 145* 124* 109*  BUN 25* 23 20  CREATININE 1.05* 0.99 0.81  CALCIUM 7.9* 7.7* 8.2*  MG  --  2.4 2.5*   GFR Estimated Creatinine Clearance: 77.9  mL/min (by C-G formula based on SCr of 0.81 mg/dL). Liver Function Tests: Recent Labs  Lab 07/23/21 1115  AST 29  ALT 25  ALKPHOS 106  BILITOT 2.0*  PROT 6.4*  ALBUMIN 2.7*   No results for input(s): LIPASE, AMYLASE in the last 168 hours. No results for input(s): AMMONIA in the last 168 hours. Coagulation profile Recent Labs  Lab 07/23/21 1115  INR 1.2    CBC: Recent Labs  Lab 07/23/21 1115 07/24/21 0539 07/25/21 0453 07/26/21 0618  WBC 16.7* 17.3* 15.6* 14.0*  NEUTROABS 13.7*  --  11.3* 9.4*  HGB 11.1* 10.2* 9.5* 10.5*  HCT 33.0* 30.3* 28.8* 31.4*  MCV 92.7 92.9 94.1 94.0  PLT 202 209 226 325   Cardiac Enzymes: No results for input(s): CKTOTAL, CKMB, CKMBINDEX, TROPONINI in  the last 168 hours. BNP: Invalid input(s): POCBNP CBG: No results for input(s): GLUCAP in the last 168 hours. D-Dimer No results for input(s): DDIMER in the last 72 hours. Hgb A1c No results for input(s): HGBA1C in the last 72 hours. Lipid Profile No results for input(s): CHOL, HDL, LDLCALC, TRIG, CHOLHDL, LDLDIRECT in the last 72 hours. Thyroid function studies No results for input(s): TSH, T4TOTAL, T3FREE, THYROIDAB in the last 72 hours.  Invalid input(s): FREET3 Anemia work up No results for input(s): VITAMINB12, FOLATE, FERRITIN, TIBC, IRON, RETICCTPCT in the last 72 hours. Microbiology Recent Results (from the past 240 hour(s))  Blood Culture (routine x 2)     Status: None (Preliminary result)   Collection Time: 07/23/21 11:03 AM   Specimen: Right Antecubital; Blood  Result Value Ref Range Status   Specimen Description RIGHT ANTECUBITAL  Final   Special Requests   Final    BOTTLES DRAWN AEROBIC AND ANAEROBIC Blood Culture adequate volume   Culture   Final    NO GROWTH 3 DAYS Performed at [facility], [address] [place], [place] [address]    Report Status PENDING  Incomplete  Blood Culture (routine x 2)     Status: None (Preliminary result)   Collection Time: 07/23/21 11:15 AM   Specimen: Site Not Specified; Blood  Result Value Ref Range Status   Specimen Description SITE NOT SPECIFIED  Final   Special Requests   Final    BOTTLES DRAWN AEROBIC AND ANAEROBIC Blood Culture results may not be optimal due to an excessive volume of blood received in culture bottles   Culture   Final    NO GROWTH 3 DAYS Performed at [facility], [address], [place], [place] [address]    Report Status PENDING  Incomplete  Resp Panel by RT-PCR (Flu A&B, Covid) Nasopharyngeal Swab     Status: None   Collection Time: 07/23/21 11:20 AM   Specimen: Nasopharyngeal Swab; Nasopharyngeal(NP) swabs in vial transport medium  Result Value Ref Range Status   SARS Coronavirus 2 by RT PCR  NEGATIVE NEGATIVE Final    Comment: (NOTE) SARS-CoV-2 target nucleic acids are NOT DETECTED.  The SARS-CoV-2 RNA is generally detectable in upper respiratory specimens during the acute phase of infection. The lowest concentration of SARS-CoV-2 viral copies this assay can detect is 138 copies/mL. A negative result does not preclude SARS-Cov-2 infection and should not be used as the sole basis for treatment or other patient management decisions. A negative result may occur with  improper specimen collection/handling, submission of specimen other than nasopharyngeal swab, presence of viral mutation(s) within the areas targeted by this assay, and inadequate number of viral copies(<138 copies/mL). A negative  result must be combined with clinical observations, patient history, and epidemiological information. The expected result is Negative.  Fact Sheet for Patients:  EntrepreneurPulse.com.au  Fact Sheet for Healthcare Providers:  IncredibleEmployment.be  This test is no t yet approved or cleared by the [place] FDA and  has been authorized for detection and/or diagnosis of SARS-CoV-2 by FDA under an Emergency Use Authorization (EUA). This EUA will remain  in effect (meaning this test can be used) for the duration of the COVID-19 declaration under Section 564(b)(1) of the Act, 21 U.S.C.section 360bbb-3(b)(1), unless the authorization is terminated  or revoked sooner.       Influenza A by PCR NEGATIVE NEGATIVE Final   Influenza B by PCR NEGATIVE NEGATIVE Final    Comment: (NOTE) The Xpert Xpress SARS-CoV-2/FLU/RSV plus assay is intended as an aid in the diagnosis of influenza from Nasopharyngeal swab specimens and should not be used as a sole basis for treatment. Nasal washings and aspirates are unacceptable for Xpert Xpress SARS-CoV-2/FLU/RSV testing.  Fact Sheet for Patients: EntrepreneurPulse.com.au  Fact Sheet for  Healthcare Providers: IncredibleEmployment.be  This test is not yet approved or cleared by the [place] FDA and has been authorized for detection and/or diagnosis of SARS-CoV-2 by FDA under an Emergency Use Authorization (EUA). This EUA will remain in effect (meaning this test can be used) for the duration of the COVID-19 declaration under Section 564(b)(1) of the Act, 21 U.S.C. section 360bbb-3(b)(1), unless the authorization is terminated or revoked.  Performed at [facility], [address], [place], [place] [address]   Urine Culture     Status: Abnormal   Collection Time: 07/23/21  2:00 PM   Specimen: Urine, Catheterized  Result Value Ref Range Status   Specimen Description   Final    URINE, CATHETERIZED Performed at [facility], [address], [place], [place] [address]    Special Requests   Final    NONE Performed at [facility], [address] [place], [place] [address]    Culture (A)  Final    20,000 COLONIES/mL DIPHTHEROIDS(CORYNEBACTERIUM SPECIES) Standardized susceptibility testing for this organism is not available. Performed at [facility], [address] [place], [place] [address]    Report Status 07/25/2021 FINAL  Final     Discharge Instructions:   Discharge Instructions     Diet - low sodium heart healthy   Complete by: As directed    Increase activity slowly   Complete by: As directed       Allergies as of 07/26/2021       Reactions   Bee Venom Anaphylaxis   Codeine Nausea And Vomiting   Other Nausea And Vomiting   General anesthesia    Propofol Nausea And Vomiting        Medication List     STOP taking these medications    Breo Ellipta 200-25 MCG/ACT Aepb Generic drug: fluticasone furoate-vilanterol   clotrimazole 10 MG troche Commonly known as: MYCELEX   famotidine 20 MG tablet Commonly known as: PEPCID       TAKE these medications    amoxicillin-clavulanate 875-125 MG tablet Commonly known  as: AUGMENTIN Take 1 tablet by mouth 2 (two) times daily for 5 doses.   aspirin 81 MG EC tablet Take 81 mg by mouth every other day.   cetirizine 10 MG tablet Commonly known as: ZyrTEC Allergy Take 1 tablet (10 mg total) by mouth daily.   Cranberry 1000 MG Caps Take 1 capsule by mouth daily.  estradiol 0.1 MG/GM vaginal cream Commonly known as: ESTRACE Place 0.5 Applicatorfuls vaginally See admin instructions. Apply every other night   ferrous sulfate 325 (65 FE) MG tablet Take 1 tablet (325 mg total) by mouth every other day. Start taking on: July 27, 2021   Fish Oil 1000 MG Caps Take 1 capsule by mouth daily.   fluconazole 150 MG tablet Commonly known as: Diflucan Take 1 tablet (150 mg total) by mouth once for 1 dose.   Garlic 7124 MG Caps Take 1 capsule by mouth daily.   ipratropium-albuterol 0.5-2.5 (3) MG/3ML Soln Commonly known as: DUONEB Take 3 mLs by nebulization every 6 (six) hours as needed.   MULTIVITAMIN ADULTS PO Take 1 tablet by mouth daily. What changed: Another medication with the same name was removed. Continue taking this medication, and follow the directions you see here.   SUPER B COMPLEX PO Take 1 tablet by mouth daily.   Trelegy Ellipta 200-62.5-25 MCG/ACT Aepb Generic drug: Fluticasone-Umeclidin-Vilant Inhale 1 puff into the lungs daily.   Vitamin D3 25 MCG (1000 UT) Caps Take 1 capsule by mouth daily.           If you experience worsening of your admission symptoms, develop shortness of breath, life threatening emergency, suicidal or homicidal thoughts you must seek medical attention immediately by calling 911 or calling your MD immediately  if symptoms less severe.   You must read complete instructions/literature along with all the possible adverse reactions/side effects for all the medicines you take and that have been prescribed to you. Take any new medicines after you have completely understood and accept all the possible  adverse reactions/side effects.    Please note   You were cared for by a hospitalist during your hospital stay. If you have any questions about your discharge medications or the care you received while you were in the hospital after you are discharged, you can call the unit and asked to speak with the hospitalist on call if the hospitalist that took care of you is not available. Once you are discharged, your primary care physician will handle any further medical issues. Please note that NO REFILLS for any discharge medications will be authorized once you are discharged, as it is imperative that you return to your primary care physician (or establish a relationship with a primary care physician if you do not have one) for your aftercare needs so that they can reassess your need for medications and monitor your lab values.       Time coordinating discharge: 31 minutes  Signed:     [facility] 07/26/2021, 3:46 PM   Pager on [email or web address]. If 7PM-7AM, please contact night-coverage at [email or web address]

## 2021-07-26 NOTE — Progress Notes (Signed)
Pt did desaturation screening with NT, sats dropped to 89% at the lowest on RA but sats remained 91-93% while ambulating. MD aware.

## 2021-07-28 LAB — CULTURE, BLOOD (ROUTINE X 2)
Culture: NO GROWTH
Culture: NO GROWTH
Special Requests: ADEQUATE

## 2021-07-29 NOTE — Telephone Encounter (Signed)
[name], did you request any records on this pt?

## 2021-07-29 NOTE — Telephone Encounter (Signed)
I have not requested any medical records on patient.

## 2021-08-01 DIAGNOSIS — Z1331 Encounter for screening for depression: Secondary | ICD-10-CM | POA: Diagnosis not present

## 2021-08-01 DIAGNOSIS — Z6832 Body mass index (BMI) 32.0-32.9, adult: Secondary | ICD-10-CM | POA: Diagnosis not present

## 2021-08-01 DIAGNOSIS — J441 Chronic obstructive pulmonary disease with (acute) exacerbation: Secondary | ICD-10-CM | POA: Diagnosis not present

## 2021-08-01 DIAGNOSIS — E669 Obesity, unspecified: Secondary | ICD-10-CM | POA: Diagnosis not present

## 2021-08-01 DIAGNOSIS — R5383 Other fatigue: Secondary | ICD-10-CM | POA: Diagnosis not present

## 2021-08-01 DIAGNOSIS — D649 Anemia, unspecified: Secondary | ICD-10-CM | POA: Diagnosis not present

## 2021-08-01 DIAGNOSIS — E611 Iron deficiency: Secondary | ICD-10-CM | POA: Diagnosis not present

## 2021-08-01 DIAGNOSIS — Z0000 Encounter for general adult medical examination without abnormal findings: Secondary | ICD-10-CM | POA: Diagnosis not present

## 2021-08-03 DIAGNOSIS — Z1211 Encounter for screening for malignant neoplasm of colon: Secondary | ICD-10-CM | POA: Diagnosis not present

## 2021-08-05 DIAGNOSIS — J449 Chronic obstructive pulmonary disease, unspecified: Secondary | ICD-10-CM | POA: Diagnosis not present

## 2021-08-05 DIAGNOSIS — R779 Abnormality of plasma protein, unspecified: Secondary | ICD-10-CM | POA: Diagnosis not present

## 2021-08-06 ENCOUNTER — Other Ambulatory Visit: Payer: Self-pay

## 2021-08-06 ENCOUNTER — Ambulatory Visit (INDEPENDENT_AMBULATORY_CARE_PROVIDER_SITE_OTHER): Payer: PPO | Admitting: Pulmonary Disease

## 2021-08-06 ENCOUNTER — Encounter: Payer: Self-pay | Admitting: Pulmonary Disease

## 2021-08-06 ENCOUNTER — Ambulatory Visit: Payer: PPO

## 2021-08-06 VITALS — BP 116/76 | HR 79 | Temp 98.1°F | Ht 66.5 in | Wt 191.6 lb

## 2021-08-06 DIAGNOSIS — F17200 Nicotine dependence, unspecified, uncomplicated: Secondary | ICD-10-CM | POA: Diagnosis not present

## 2021-08-06 DIAGNOSIS — J449 Chronic obstructive pulmonary disease, unspecified: Secondary | ICD-10-CM | POA: Diagnosis not present

## 2021-08-06 NOTE — Progress Notes (Signed)
[name]    [id number]    02/22/1956  Primary Care Physician:Fusco, Purcell Nails, MD  Referring Physician: Redmond School, MD [address] [place],  [place] [address]    Chief complaint: Follow-up for asthma, COPD  HPI: 66 year old ex smoker with COPD, asthma, seasonal allergies Developed dyspnea, cough, hypoxia after receiving the first dose of Moderna vaccine in February.  Treated with supplemental oxygen and steroids.  Reports pneumonia x2 last year and thinks she may had Covid infection at that time.  Seen by my partner Dr. Tamala Julian around May 2021 and diagnosed with asthma, possible allergic reaction to the vaccine.   Evaluated by hematology in May 2021 for leukocytosis with no evidence of myeloproliferative disorder  Pets: [place] farm with rescue horses and dogs Occupation: Psychologist, sport and exercise Exposures: No known exposures.  No mold, hot tub, Jacuzzi Smoking history: 23-pack-year smoker.  Quit smoking in December 2022 Travel history: No significant travel history Relevant family history: No significant family issue of lung disease.  Interim History: Has recurrent episodes of bronchitis, asthma exacerbations since November 2022.  She has been treated with Z-Pak, amoxicillin and 2 rounds of prednisone with minimal improvement.  She was prescribed Singulair but elected not to take it after she read the possible side effects  She was hospitalized in early December 2023 for community-acquired pneumonia with right upper lobe infiltrate on chest x-ray treated with empiric IV antibiotics and fluids.  She was briefly on supplemental oxygen but did not require oxygen to go home. Overall she is slowly improving but continues to have weakness and fatigue.  She finally quit smoking for New Year's 2023 resolution  Outpatient Encounter Medications as of 08/06/2021  Medication Sig   aspirin 81 MG EC tablet Take 81 mg by mouth every other day.   B Complex-C (SUPER B COMPLEX PO) Take 1  tablet by mouth daily.   cetirizine (ZYRTEC ALLERGY) 10 MG tablet Take 1 tablet (10 mg total) by mouth daily.   Cholecalciferol (VITAMIN D3) 25 MCG (1000 UT) CAPS Take 1 capsule by mouth daily.   Cranberry 1000 MG CAPS Take 1 capsule by mouth daily.   estradiol (ESTRACE) 0.1 MG/GM vaginal cream Place 0.5 Applicatorfuls vaginally See admin instructions. Apply every other night   Fluticasone-Umeclidin-Vilant (TRELEGY ELLIPTA) 200-62.5-25 MCG/ACT AEPB Inhale 1 puff into the lungs daily.   Garlic 4782 MG CAPS Take 1 capsule by mouth daily.   ipratropium-albuterol (DUONEB) 0.5-2.5 (3) MG/3ML SOLN Take 3 mLs by nebulization every 6 (six) hours as needed.   Multiple Vitamins-Minerals (MULTIVITAMIN ADULTS PO) Take 1 tablet by mouth daily.   Omega-3 Fatty Acids (FISH OIL) 1000 MG CAPS Take 1 capsule by mouth daily.   ferrous sulfate 325 (65 FE) MG tablet Take 1 tablet (325 mg total) by mouth every other day. (Patient not taking: Reported on 08/06/2021)   No facility-administered encounter medications on file as of 08/06/2021.   Physical Exam: Blood pressure 116/76, pulse 79, temperature 98.1 F (36.7 C), temperature source Oral, height 5' 6.5" (1.689 m), weight 191 lb 9.6 oz (86.9 kg), SpO2 97 %. Gen:      No acute distress HEENT:  EOMI, sclera anicteric Neck:     No masses; no thyromegaly Lungs:    Clear to auscultation bilaterally; normal respiratory effort CV:         Regular rate and rhythm; no murmurs Abd:      + bowel sounds; soft, non-tender; no palpable masses, no  distension Ext:    No edema; adequate peripheral perfusion Skin:      Warm and dry; no rash Neuro: alert and oriented x 3 Psych: normal mood and affect   Data Reviewed: Imaging: CT chest 01/16/2020-centrilobular emphysema, subcentimeter pulmonary nodule. Screening CT chest 02/15/2021-centrilobular emphysema, stable lung nodules Chest x-ray 07/23/2021-dense right upper lobe infiltrate I have reviewed the images  personally  PFTs: 01/27/2020 FVC 2.98 [88%], FEV1 2.30 [88%], F/F 77, TLC 5.47 [102+], DLCO 20.17 [95%] Normal test  Labs: CBC 01/27/2020-WBC 10.7, eos 2.4%, absolute eosinophil count 364 IgE 01/27/2020-428  Assessment:  Asthma-COPD overlap syndrome Recent hospitalization for right upper lobe pneumonia Continue Trelegy, nebs as needed Does not want to take Singulair due to fear of side effects Chest x-ray in 2 weeks to ensure clearance of infiltrate  Pulmonary nodules Continue annual screening CT chest.  Ex-smoker Congratulated on quitting smoking  Plan/Recommendations: Change Breo to Trelegy Albuterol, duo nebs Singulair Chest x-ray  Marshell Garfinkel MD [facility] 08/06/2021, 10:41 AM  CC: Redmond School, MD

## 2021-08-06 NOTE — Patient Instructions (Signed)
I am glad you are improving with your breathing Continue the inhaler and nebs We will get a chest x-ray in 2 weeks.  To be done at [facility] Follow-up in 3 months

## 2021-08-20 ENCOUNTER — Other Ambulatory Visit: Payer: Self-pay

## 2021-08-20 ENCOUNTER — Ambulatory Visit (HOSPITAL_COMMUNITY)
Admission: RE | Admit: 2021-08-20 | Discharge: 2021-08-20 | Disposition: A | Discharge status: Home / Self Care | Payer: PPO | Source: Ambulatory Visit | Attending: Pulmonary Disease | Admitting: Pulmonary Disease

## 2021-08-20 ENCOUNTER — Encounter: Payer: Self-pay | Admitting: Pulmonary Disease

## 2021-08-20 DIAGNOSIS — R059 Cough, unspecified: Secondary | ICD-10-CM | POA: Diagnosis not present

## 2021-08-20 DIAGNOSIS — J449 Chronic obstructive pulmonary disease, unspecified: Secondary | ICD-10-CM | POA: Diagnosis not present

## 2021-08-20 DIAGNOSIS — J189 Pneumonia, unspecified organism: Secondary | ICD-10-CM | POA: Diagnosis not present

## 2021-08-21 ENCOUNTER — Encounter: Payer: Self-pay | Admitting: Pulmonary Disease

## 2021-08-21 NOTE — Telephone Encounter (Signed)
Dr. Vaughan Browner, please see mychart message sent by pt.

## 2021-08-21 NOTE — Telephone Encounter (Signed)
I have reviewed the chest x ray from today and it shows that the pneumonia is nearly cleared off. That is good news and shows that you are on the road to recovery.   Continue to increase activity as tolerated

## 2021-09-05 DIAGNOSIS — J449 Chronic obstructive pulmonary disease, unspecified: Secondary | ICD-10-CM | POA: Diagnosis not present

## 2021-10-03 DIAGNOSIS — J449 Chronic obstructive pulmonary disease, unspecified: Secondary | ICD-10-CM | POA: Diagnosis not present

## 2021-10-15 ENCOUNTER — Other Ambulatory Visit: Payer: Self-pay | Admitting: Pulmonary Disease

## 2021-10-22 ENCOUNTER — Ambulatory Visit: Payer: PPO | Admitting: Pulmonary Disease

## 2021-10-22 ENCOUNTER — Encounter: Payer: Self-pay | Admitting: Pulmonary Disease

## 2021-10-22 VITALS — BP 142/72 | HR 71 | Temp 97.9°F | Ht 66.0 in | Wt 206.8 lb

## 2021-10-22 DIAGNOSIS — J449 Chronic obstructive pulmonary disease, unspecified: Secondary | ICD-10-CM | POA: Diagnosis not present

## 2021-10-22 NOTE — Progress Notes (Signed)
? ?      ?[name]    [id number]    12/10/1955 ? ?Primary Care Physician:Fusco, Purcell Nails, MD ? ?Referring Physician: Redmond School, MD ?[address] ?[place],  [place] [address]  ? ? ?Chief complaint: Follow-up for asthma, COPD ? ?HPI: ?66 year old ex smoker with COPD, asthma, seasonal allergies ?Developed dyspnea, cough, hypoxia after receiving the first dose of Moderna vaccine in February.  Treated with supplemental oxygen and steroids.  Reports pneumonia x2 last year and thinks she may had Covid infection at that time.  Seen by my partner Dr. Tamala Julian around May 2021 and diagnosed with asthma, possible allergic reaction to the vaccine.   ?Evaluated by hematology in May 2021 for leukocytosis with no evidence of myeloproliferative disorder ? ?She was hospitalized in early December 2022 for community-acquired pneumonia with right upper lobe infiltrate on chest x-ray treated with empiric IV antibiotics and fluids.  She was briefly on supplemental oxygen but did not require oxygen to go home. Overall she is slowly improving but continues to have weakness and fatigue.  She finally quit smoking for New Year's 2023 resolution ? ?Has recurrent episodes of bronchitis, asthma exacerbations since November 2022.  She has been treated with Z-Pak, amoxicillin and 2 rounds of prednisone with minimal improvement.  She was prescribed Singulair but elected not to take it after she read the possible side effects ? ?Pets: Lives on a farm with rescue horses and dogs ?Occupation: [name] ?Exposures: No known exposures.  No mold, hot tub, Jacuzzi ?Smoking history: 23-pack-year smoker.  Quit smoking in December 2022 ?Travel history: No significant travel history ?Relevant family history: No significant family issue of lung disease. ? ?Interim History: ?States that breathing is doing well with no issues ?Follow-up chest x-ray shows resolution of pneumonia from December 2022 ? ?Outpatient Encounter Medications as of  10/22/2021  ?Medication Sig  ? aspirin 81 MG EC tablet Take 81 mg by mouth every other day.  ? B Complex-C (SUPER B COMPLEX PO) Take 1 tablet by mouth daily.  ? cetirizine (ZYRTEC ALLERGY) 10 MG tablet Take 1 tablet (10 mg total) by mouth daily.  ? Cholecalciferol (VITAMIN D3) 25 MCG (1000 UT) CAPS Take 1 capsule by mouth daily.  ? Cranberry 1000 MG CAPS Take 1 capsule by mouth daily.  ? estradiol (ESTRACE) 0.1 MG/GM vaginal cream Place 0.5 Applicatorfuls vaginally See admin instructions. Apply every other night (Patient taking differently: Place 0.5 Applicatorfuls vaginally See admin instructions. Apply 2 times a week at night)  ? ferrous sulfate 325 (65 FE) MG tablet Take 1 tablet (325 mg total) by mouth every other day.  ? Fluticasone-Umeclidin-Vilant (TRELEGY ELLIPTA) 200-62.5-25 MCG/ACT AEPB INHALE 1 PUFF INTO THE LUNGS ONCE DAILY.  ? Garlic 7124 MG CAPS Take 1 capsule by mouth daily.  ? ipratropium-albuterol (DUONEB) 0.5-2.5 (3) MG/3ML SOLN Take 3 mLs by nebulization every 6 (six) hours as needed. (Patient taking differently: Take 3 mLs by nebulization every 6 (six) hours as needed. Only taking in morning)  ? Multiple Vitamins-Minerals (MULTIVITAMIN ADULTS PO) Take 1 tablet by mouth daily.  ? Omega-3 Fatty Acids (FISH OIL) 1000 MG CAPS Take 1 capsule by mouth daily.  ? ?No facility-administered encounter medications on file as of 10/22/2021.  ? ?Physical Exam: ?Blood pressure (!) 142/72, pulse 71, temperature 97.9 ?F (36.6 ?C), temperature source Oral, height '5\' 6"'$  (1.676 m), weight 206 lb 12.8 oz (93.8 kg), SpO2 98 %. ?Gen:      No acute distress ?HEENT:  EOMI, sclera  anicteric ?Neck:     No masses; no thyromegaly ?Lungs:    Bibasal crackles ?CV:         Regular rate and rhythm; no murmurs ?Abd:      + bowel sounds; soft, non-tender; no palpable masses, no distension ?Ext:    No edema; adequate peripheral perfusion ?Skin:      Warm and dry; no rash ?Neuro: alert and oriented x 3 ?Psych: normal mood and affect   ? ?Data Reviewed: ?Imaging: ?CT chest 01/16/2020-centrilobular emphysema, subcentimeter pulmonary nodule. ?Screening CT chest 02/15/2021-centrilobular emphysema, stable lung nodules ?Chest x-ray 07/23/2021-dense right upper lobe infiltrate ?Chest x-ray 08/20/2021-resolution of right upper lobe infiltrate ?I have reviewed the images personally ? ?PFTs: ?01/27/2020 ?FVC 2.98 [88%], FEV1 2.30 [88%], F/F 77, TLC 5.47 [102+], DLCO 20.17 [95%] ?Normal test ? ?Labs: ?CBC 01/27/2020-WBC 10.7, eos 2.4%, absolute eosinophil count 364 ?IgE 01/27/2020-428 ? ?Assessment:  ?Asthma-COPD overlap syndrome ?Recent hospitalization for right upper lobe pneumonia with follow-up chest x-ray showing resolution ?Continue Trelegy, nebs as needed ?Does not want to take Singulair due to fear of side effects ? ?Pulmonary nodules ?Continue annual screening CT chest. ? ?Ex-smoker ?Congratulated on quitting smoking ? ?Plan/Recommendations: ?Continue Trelegy ?Albuterol, duo nebs ?Singulair ? ?Marshell Garfinkel MD ?[facility] ?10/22/2021, 11:46 AM ? ?CC: Redmond School, MD ? ? ? ?

## 2021-10-22 NOTE — Patient Instructions (Signed)
Glad you are stable with regard to your breathing ?Continue Trelegy ?Follow-up in 1 year.  Video visit okay. ?

## 2021-10-30 NOTE — Telephone Encounter (Signed)
Patient has been seen twice since this encounter and no mention was made about requesting records. Will close encounter.  ?

## 2021-11-03 DIAGNOSIS — J449 Chronic obstructive pulmonary disease, unspecified: Secondary | ICD-10-CM | POA: Diagnosis not present

## 2021-12-03 DIAGNOSIS — J449 Chronic obstructive pulmonary disease, unspecified: Secondary | ICD-10-CM | POA: Diagnosis not present

## 2021-12-18 ENCOUNTER — Other Ambulatory Visit: Payer: Self-pay | Admitting: Pulmonary Disease

## 2021-12-24 ENCOUNTER — Telehealth: Payer: Self-pay | Admitting: Pulmonary Disease

## 2021-12-24 ENCOUNTER — Other Ambulatory Visit: Payer: Self-pay | Admitting: Pulmonary Disease

## 2021-12-24 MED ORDER — NYSTATIN 100000 UNIT/ML MT SUSP: 5.0000 mL | Freq: Four times a day (QID) | OROMUCOSAL | 0 refills | Status: DC

## 2021-12-24 NOTE — Telephone Encounter (Signed)
Nystatin Prescription sent to [facility].  Patient aware medication was sent to requested pharmacy.  Nothing further at this time.

## 2021-12-24 NOTE — Telephone Encounter (Signed)
Nystatin 7-10 days

## 2021-12-24 NOTE — Progress Notes (Signed)
Nystatin called in

## 2021-12-24 NOTE — Telephone Encounter (Signed)
Patient was seen by her dentist today and told she had thrush in her mouth and advised to contact Dr. Vaughan Browner.  Patient stated she is using Trelegy and rinsing her mouth afterwards.  Patient stated she had bee trying Listerine, but it was burning her mouth.  Advised patient to stop the Listerine, because it is causing irritation and pain.   Patient requested any prescriptions to go to [facility].   Message routed to Dr. Ander Slade to advise

## 2022-01-03 DIAGNOSIS — J449 Chronic obstructive pulmonary disease, unspecified: Secondary | ICD-10-CM | POA: Diagnosis not present

## 2022-01-06 ENCOUNTER — Telehealth: Payer: Self-pay | Admitting: Pulmonary Disease

## 2022-01-06 NOTE — Telephone Encounter (Signed)
ATC LVMTCB x 1  

## 2022-01-07 MED ORDER — FLUCONAZOLE 100 MG PO TABS: 100.0000 mg | ORAL_TABLET | Freq: Every day | ORAL | 0 refills | Status: DC

## 2022-01-07 NOTE — Telephone Encounter (Signed)
I called and discussed with patient.  Prescription for Diflucan 100 mg a day for 7 days sent to her pharmacy.  Nothing further needed.

## 2022-01-07 NOTE — Telephone Encounter (Signed)
Called patient this morning and patient states that she is still having sore in the front and the back of her mouth. She states that she is about done with the nystatin swish and swallow and she is doing it 4 times a day with no relief in the sores in her mouth.  Please advise sir

## 2022-02-02 DIAGNOSIS — J449 Chronic obstructive pulmonary disease, unspecified: Secondary | ICD-10-CM | POA: Diagnosis not present

## 2022-02-10 ENCOUNTER — Telehealth: Payer: Self-pay | Admitting: Pulmonary Disease

## 2022-02-10 NOTE — Telephone Encounter (Signed)
Called patient. Patient states that at the back of her throat she is have sore ness. Patient states that she is also having swelling around her lips as before. Patient states that she has thrush and would like something for it and her sore throat.   Please advise sir

## 2022-02-11 ENCOUNTER — Other Ambulatory Visit: Payer: Self-pay | Admitting: Pulmonary Disease

## 2022-02-11 MED ORDER — NYSTATIN 100000 UNIT/ML MT SUSP: 5.0000 mL | Freq: Four times a day (QID) | OROMUCOSAL | 0 refills | Status: DC

## 2022-02-11 NOTE — Telephone Encounter (Signed)
Called and spoke with patient. She stated that she was at the pharmacy now getting the nystatin.   Nothing further needed.

## 2022-02-11 NOTE — Telephone Encounter (Signed)
Dr. Mannam, please advise. 

## 2022-02-11 NOTE — Telephone Encounter (Signed)
Patient available today after 12:30 pm. Patient phone number is [phone].

## 2022-02-11 NOTE — Telephone Encounter (Signed)
I sent in an order for nystatin to her pharmacy

## 2022-02-11 NOTE — Telephone Encounter (Signed)
Called the pt and there was no answer- LMTCB    

## 2022-02-17 ENCOUNTER — Ambulatory Visit (HOSPITAL_COMMUNITY)
Admission: RE | Admit: 2022-02-17 | Discharge: 2022-02-17 | Disposition: A | Discharge status: Home / Self Care | Payer: PPO | Source: Ambulatory Visit | Attending: Internal Medicine | Admitting: Internal Medicine

## 2022-02-17 DIAGNOSIS — F17200 Nicotine dependence, unspecified, uncomplicated: Secondary | ICD-10-CM | POA: Insufficient documentation

## 2022-02-17 DIAGNOSIS — J439 Emphysema, unspecified: Secondary | ICD-10-CM | POA: Insufficient documentation

## 2022-02-17 DIAGNOSIS — Z122 Encounter for screening for malignant neoplasm of respiratory organs: Secondary | ICD-10-CM | POA: Insufficient documentation

## 2022-02-17 DIAGNOSIS — J479 Bronchiectasis, uncomplicated: Secondary | ICD-10-CM | POA: Diagnosis not present

## 2022-02-17 DIAGNOSIS — F17210 Nicotine dependence, cigarettes, uncomplicated: Secondary | ICD-10-CM | POA: Diagnosis not present

## 2022-02-17 DIAGNOSIS — Z87891 Personal history of nicotine dependence: Secondary | ICD-10-CM

## 2022-02-19 ENCOUNTER — Other Ambulatory Visit: Payer: Self-pay

## 2022-02-19 DIAGNOSIS — Z122 Encounter for screening for malignant neoplasm of respiratory organs: Secondary | ICD-10-CM

## 2022-02-19 DIAGNOSIS — Z87891 Personal history of nicotine dependence: Secondary | ICD-10-CM

## 2022-02-25 ENCOUNTER — Telehealth: Payer: Self-pay | Admitting: Pulmonary Disease

## 2022-02-25 DIAGNOSIS — H04123 Dry eye syndrome of bilateral lacrimal glands: Secondary | ICD-10-CM | POA: Diagnosis not present

## 2022-02-25 NOTE — Telephone Encounter (Signed)
Called and spoke with pt who states she is still having problems with thrush after being on both nystatin and diflucan. States that she has also been breaking out in night sweats and has not had a good night's sleep in almost 2 weeks.  Asked pt if she wanted to be seen to further discuss meds to see what might need to be done and she stated that would be good. Acute visit scheduled for pt with Dr. Verlee Monte. Nothing further needed.

## 2022-02-25 NOTE — Progress Notes (Unsigned)
Synopsis: Referred for thrush, night sweats by Redmond School, MD  Subjective:   PATIENT ID: [name] GENDER: female DOB: [birth year], MRN: [id number]  No chief complaint on file.  41yF with ACOS, pulmonary nodules, smoking followed by Dr. Vaughan Browner  Otherwise pertinent review of systems is negative.  Past Medical History:  Diagnosis Date   Asthma    Diverticulosis    Hypercholesteremia    Pneumonia 12/2018   double pneumonia   PONV (postoperative nausea and vomiting)    Seasonal allergies    Shingles    Varicose veins      Family History  Problem Relation Age of Onset   Breast cancer Mother    Cancer Mother        breast   Heart attack Mother    Acute myelogenous leukemia Mother    Gallbladder disease Father    Thyroid cancer Maternal Aunt    Brain cancer Maternal Aunt    Breast cancer Maternal Aunt    Lung cancer Maternal Aunt    Bone cancer Paternal Aunt    Diabetes Maternal Grandmother    Dementia Maternal Grandmother    Leukemia Maternal Grandfather    Bone cancer Paternal Grandmother    Breast cancer Cousin    Diabetes Brother    Skin cancer Brother    Diabetes Brother    Colon cancer Neg Hx      Past Surgical History:  Procedure Laterality Date   ABDOMINAL HYSTERECTOMY     partial   BACK SURGERY     cervical surgery   COLONOSCOPY N/A 02/04/2017   Procedure: COLONOSCOPY;  Surgeon: Rogene Houston, MD;  [facility];  Service: Endoscopy;  Laterality: N/A;  930   ENDOSCOPIC CONCHA BULLOSA RESECTION Bilateral 03/21/2019   Procedure: ENDOSCOPIC CONCHA BULLOSA RESECTION;  Surgeon: Leta Baptist, MD;  Location: [place];  Service: ENT;  Laterality: Bilateral;   NASAL SEPTOPLASTY W/ TURBINOPLASTY Bilateral 03/21/2019   Procedure: NASAL SEPTOPLASTY WITH BILATERAL TURBINATE REDUCTION;  Surgeon: Leta Baptist, MD;  Location: [place];  Service: ENT;  Laterality: Bilateral;   TONSILLECTOMY     WRIST SURGERY     age  79yr    Social History   Socioeconomic History   Marital status: Widowed    Spouse name: Not on file   Number of children: 0   Years of education: Not on file   Highest education level: Not on file  Occupational History   Not on file  Tobacco Use   Smoking status: Former    Packs/day: 0.50    Years: 45.00    Total pack years: 22.50    Types: Cigarettes    Quit date: 07/20/2021    Years since quitting: 0.6   Smokeless tobacco: Never  Vaping Use   Vaping Use: Former  Substance and Sexual Activity   Alcohol use: Yes    Alcohol/week: 0.0 standard drinks of alcohol    Comment: occ   Drug use: No   Sexual activity: Not Currently    Birth control/protection: Post-menopausal  Other Topics Concern   Not on file  Social History Narrative   Not on file   Social Determinants of Health   Financial Resource Strain: Low Risk  (06/27/2020)   Overall Financial Resource Strain (CARDIA)    Difficulty of Paying Living Expenses: Not hard at all  Food Insecurity: No Food Insecurity (06/27/2020)   Hunger Vital Sign    Worried About Running Out of Food  in the Last Year: Never true    [place] in the Last Year: Never true  Transportation Needs: No Transportation Needs (06/27/2020)   PRAPARE - Hydrologist (Medical): No    Lack of Transportation (Non-Medical): No  Physical Activity: Inactive (06/27/2020)   Exercise Vital Sign    Days of Exercise per Week: 0 days    Minutes of Exercise per Session: 0 min  Stress: No Stress Concern Present (06/27/2020)   [place]    Feeling of Stress : Not at all  Social Connections: Moderately Integrated (06/27/2020)   Social Connection and Isolation Panel [NHANES]    Frequency of Communication with Friends and Family: More than three times a week    Frequency of Social Gatherings with Friends and Family: Three times a week    Attends Religious Services:  1 to 4 times per year    Active Member of Clubs or Organizations: No    Attends Archivist Meetings: Never    Marital Status: Married  Human resources officer Violence: Not At Risk (06/27/2020)   Humiliation, Afraid, Rape, and Kick questionnaire    Fear of Current or Ex-Partner: No    Emotionally Abused: No    Physically Abused: No    Sexually Abused: No     Allergies  Allergen Reactions   Bee Venom Anaphylaxis   Codeine Nausea And Vomiting   Other Nausea And Vomiting    General anesthesia    Propofol Nausea And Vomiting     Outpatient Medications Prior to Visit  Medication Sig Dispense Refill   Fluticasone-Umeclidin-Vilant (TRELEGY ELLIPTA) 200-62.5-25 MCG/ACT AEPB INHALE 1 PUFF INTO THE LUNGS ONCE DAILY. 60 each 5   aspirin 81 MG EC tablet Take 81 mg by mouth every other day.     B Complex-C (SUPER B COMPLEX PO) Take 1 tablet by mouth daily.     cetirizine (ZYRTEC ALLERGY) 10 MG tablet Take 1 tablet (10 mg total) by mouth daily. 90 tablet 3   Cholecalciferol (VITAMIN D3) 25 MCG (1000 UT) CAPS Take 1 capsule by mouth daily.     Cranberry 1000 MG CAPS Take 1 capsule by mouth daily.     estradiol (ESTRACE) 0.1 MG/GM vaginal cream Place 0.5 Applicatorfuls vaginally See admin instructions. Apply every other night (Patient taking differently: Place 0.5 Applicatorfuls vaginally See admin instructions. Apply 2 times a week at night) 45 g 3   ferrous sulfate 325 (65 FE) MG tablet Take 1 tablet (325 mg total) by mouth every other day. 30 tablet 0   fluconazole (DIFLUCAN) 100 MG tablet TAKE (1) TABLET BY MOUTH ONCE DAILY FOR (7) DAYS. 7 tablet 0   Garlic 2993 MG CAPS Take 1 capsule by mouth daily.     ipratropium-albuterol (DUONEB) 0.5-2.5 (3) MG/3ML SOLN Take 3 mLs by nebulization every 6 (six) hours as needed. (Patient taking differently: Take 3 mLs by nebulization every 6 (six) hours as needed. Only taking in morning) 360 mL 11   Multiple Vitamins-Minerals (MULTIVITAMIN ADULTS PO) Take  1 tablet by mouth daily.     nystatin (MYCOSTATIN) 100000 UNIT/ML suspension Take 5 mLs (500,000 Units total) by mouth 4 (four) times daily. Swish and spit, retain in mouth for as long as tolerated 473 mL 0   Omega-3 Fatty Acids (FISH OIL) 1000 MG CAPS Take 1 capsule by mouth daily.     No facility-administered medications prior to visit.  Objective:   Physical Exam:  General appearance: 66 y.o., female, NAD, conversant  Eyes: anicteric sclerae; PERRL, tracking appropriately HENT: NCAT; MMM Neck: Trachea midline; no lymphadenopathy, no JVD Lungs: CTAB, no crackles, no wheeze, with normal respiratory effort CV: RRR, no murmur  Abdomen: Soft, non-tender; non-distended, BS present  Extremities: No peripheral edema, warm Skin: Normal turgor and texture; no rash Psych: Appropriate affect Neuro: Alert and oriented to person and place, no focal deficit     There were no vitals filed for this visit.   on *** LPM *** RA BMI Readings from Last 3 Encounters:  10/22/21 33.38 kg/m  08/06/21 30.46 kg/m  07/23/21 31.32 kg/m   Wt Readings from Last 3 Encounters:  10/22/21 206 lb 12.8 oz (93.8 kg)  08/06/21 191 lb 9.6 oz (86.9 kg)  07/23/21 200 lb (90.7 kg)     CBC    Component Value Date/Time   WBC 14.0 (H) 07/26/2021 0618   RBC 3.34 (L) 07/26/2021 0618   HGB 10.5 (L) 07/26/2021 0618   HCT 31.4 (L) 07/26/2021 0618   PLT 325 07/26/2021 0618   MCV 94.0 07/26/2021 0618   MCH 31.4 07/26/2021 0618   MCHC 33.4 07/26/2021 0618   RDW 13.5 07/26/2021 0618   LYMPHSABS 2.7 07/26/2021 0618   MONOABS 0.6 07/26/2021 0618   EOSABS 0.0 07/26/2021 0618   BASOSABS 0.0 07/26/2021 0618    ***  Chest Imaging: LDCT 02/18/22 reviewed by me with borderline bronchiectasis, bronchial wall thickening, emphysema, small nodules  Pulmonary Functions Testing Results:    Latest Ref Rng & Units 01/27/2020    1:37 PM  PFT Results  FVC-Pre L 2.84   FVC-Predicted Pre % 83   FVC-Post L 2.98    FVC-Predicted Post % 88   Pre FEV1/FVC % % 74   Post FEV1/FCV % % 77   FEV1-Pre L 2.09   FEV1-Predicted Pre % 80   FEV1-Post L 2.30   DLCO uncorrected ml/min/mmHg 20.17   DLCO UNC% % 95   DLCO corrected ml/min/mmHg 20.17   DLCO COR %Predicted % 95   DLVA Predicted % 93   TLC L 5.47   TLC % Predicted % 102   RV % Predicted % 109    Normal       Assessment & Plan:    Plan:      Maryjane Hurter, MD [facility] 02/25/2022 6:16 PM

## 2022-02-26 ENCOUNTER — Ambulatory Visit: Payer: PPO | Admitting: Student

## 2022-02-26 ENCOUNTER — Encounter: Payer: Self-pay | Admitting: Student

## 2022-02-26 VITALS — BP 124/72 | HR 74 | Temp 97.7°F | Ht 66.0 in | Wt 214.8 lb

## 2022-02-26 DIAGNOSIS — J449 Chronic obstructive pulmonary disease, unspecified: Secondary | ICD-10-CM | POA: Diagnosis not present

## 2022-02-26 DIAGNOSIS — T50905A Adverse effect of unspecified drugs, medicaments and biological substances, initial encounter: Secondary | ICD-10-CM | POA: Diagnosis not present

## 2022-02-26 DIAGNOSIS — R300 Dysuria: Secondary | ICD-10-CM | POA: Diagnosis not present

## 2022-02-26 DIAGNOSIS — J441 Chronic obstructive pulmonary disease with (acute) exacerbation: Secondary | ICD-10-CM | POA: Diagnosis not present

## 2022-02-26 DIAGNOSIS — K121 Other forms of stomatitis: Secondary | ICD-10-CM

## 2022-02-26 DIAGNOSIS — B356 Tinea cruris: Secondary | ICD-10-CM | POA: Diagnosis not present

## 2022-02-26 DIAGNOSIS — B370 Candidal stomatitis: Secondary | ICD-10-CM | POA: Diagnosis not present

## 2022-02-26 DIAGNOSIS — E6609 Other obesity due to excess calories: Secondary | ICD-10-CM | POA: Diagnosis not present

## 2022-02-26 DIAGNOSIS — Z6833 Body mass index (BMI) 33.0-33.9, adult: Secondary | ICD-10-CM | POA: Diagnosis not present

## 2022-02-26 MED ORDER — ANORO ELLIPTA 62.5-25 MCG/ACT IN AEPB: 1.0000 | INHALATION_SPRAY | Freq: Every day | RESPIRATORY_TRACT | 5 refills | Status: DC

## 2022-02-26 MED ORDER — ANORO ELLIPTA 62.5-25 MCG/ACT IN AEPB: 1.0000 | INHALATION_SPRAY | Freq: Every day | RESPIRATORY_TRACT | 0 refills | Status: DC

## 2022-02-26 MED ORDER — NYSTATIN 100000 UNIT/ML MT SUSP: 5.0000 mL | Freq: Three times a day (TID) | OROMUCOSAL | 0 refills | Status: DC | PRN

## 2022-02-26 NOTE — Patient Instructions (Signed)
-   magic mouthwash 3 times daily  - stop trelegy - start anoro 1 puff once daily - finish diflucan course - appointment in clinic in october

## 2022-03-05 DIAGNOSIS — J449 Chronic obstructive pulmonary disease, unspecified: Secondary | ICD-10-CM | POA: Diagnosis not present

## 2022-04-05 DIAGNOSIS — J449 Chronic obstructive pulmonary disease, unspecified: Secondary | ICD-10-CM | POA: Diagnosis not present

## 2022-05-05 DIAGNOSIS — J449 Chronic obstructive pulmonary disease, unspecified: Secondary | ICD-10-CM | POA: Diagnosis not present

## 2022-05-22 DIAGNOSIS — Z23 Encounter for immunization: Secondary | ICD-10-CM | POA: Diagnosis not present

## 2022-06-05 DIAGNOSIS — J449 Chronic obstructive pulmonary disease, unspecified: Secondary | ICD-10-CM | POA: Diagnosis not present

## 2022-06-16 DIAGNOSIS — Z6833 Body mass index (BMI) 33.0-33.9, adult: Secondary | ICD-10-CM | POA: Diagnosis not present

## 2022-06-16 DIAGNOSIS — I872 Venous insufficiency (chronic) (peripheral): Secondary | ICD-10-CM | POA: Diagnosis not present

## 2022-06-16 DIAGNOSIS — J449 Chronic obstructive pulmonary disease, unspecified: Secondary | ICD-10-CM | POA: Diagnosis not present

## 2022-06-16 DIAGNOSIS — Z9229 Personal history of other drug therapy: Secondary | ICD-10-CM | POA: Diagnosis not present

## 2022-06-16 DIAGNOSIS — Z0001 Encounter for general adult medical examination with abnormal findings: Secondary | ICD-10-CM | POA: Diagnosis not present

## 2022-06-16 DIAGNOSIS — R635 Abnormal weight gain: Secondary | ICD-10-CM | POA: Diagnosis not present

## 2022-06-16 DIAGNOSIS — R600 Localized edema: Secondary | ICD-10-CM | POA: Diagnosis not present

## 2022-06-23 DIAGNOSIS — E2749 Other adrenocortical insufficiency: Secondary | ICD-10-CM | POA: Diagnosis not present

## 2022-07-05 DIAGNOSIS — J449 Chronic obstructive pulmonary disease, unspecified: Secondary | ICD-10-CM | POA: Diagnosis not present

## 2022-08-01 DIAGNOSIS — E6609 Other obesity due to excess calories: Secondary | ICD-10-CM | POA: Diagnosis not present

## 2022-08-01 DIAGNOSIS — J449 Chronic obstructive pulmonary disease, unspecified: Secondary | ICD-10-CM | POA: Diagnosis not present

## 2022-08-01 DIAGNOSIS — M67442 Ganglion, left hand: Secondary | ICD-10-CM | POA: Diagnosis not present

## 2022-08-01 DIAGNOSIS — I10 Essential (primary) hypertension: Secondary | ICD-10-CM | POA: Diagnosis not present

## 2022-08-01 DIAGNOSIS — Z6833 Body mass index (BMI) 33.0-33.9, adult: Secondary | ICD-10-CM | POA: Diagnosis not present

## 2022-08-01 DIAGNOSIS — M6530 Trigger finger, unspecified finger: Secondary | ICD-10-CM | POA: Diagnosis not present

## 2022-08-14 DIAGNOSIS — Z0001 Encounter for general adult medical examination with abnormal findings: Secondary | ICD-10-CM | POA: Diagnosis not present

## 2022-08-14 DIAGNOSIS — I10 Essential (primary) hypertension: Secondary | ICD-10-CM | POA: Diagnosis not present

## 2022-08-14 DIAGNOSIS — Z6833 Body mass index (BMI) 33.0-33.9, adult: Secondary | ICD-10-CM | POA: Diagnosis not present

## 2022-08-14 DIAGNOSIS — E559 Vitamin D deficiency, unspecified: Secondary | ICD-10-CM | POA: Diagnosis not present

## 2022-08-14 DIAGNOSIS — E039 Hypothyroidism, unspecified: Secondary | ICD-10-CM | POA: Diagnosis not present

## 2022-08-14 DIAGNOSIS — D518 Other vitamin B12 deficiency anemias: Secondary | ICD-10-CM | POA: Diagnosis not present

## 2022-08-14 DIAGNOSIS — Z1331 Encounter for screening for depression: Secondary | ICD-10-CM | POA: Diagnosis not present

## 2022-08-14 DIAGNOSIS — E6609 Other obesity due to excess calories: Secondary | ICD-10-CM | POA: Diagnosis not present

## 2022-08-14 DIAGNOSIS — J449 Chronic obstructive pulmonary disease, unspecified: Secondary | ICD-10-CM | POA: Diagnosis not present

## 2022-08-14 DIAGNOSIS — Z9229 Personal history of other drug therapy: Secondary | ICD-10-CM | POA: Diagnosis not present

## 2022-08-14 DIAGNOSIS — M720 Palmar fascial fibromatosis [Dupuytren]: Secondary | ICD-10-CM | POA: Diagnosis not present

## 2022-08-25 DIAGNOSIS — M79642 Pain in left hand: Secondary | ICD-10-CM | POA: Diagnosis not present

## 2022-08-25 DIAGNOSIS — M720 Palmar fascial fibromatosis [Dupuytren]: Secondary | ICD-10-CM | POA: Diagnosis not present

## 2022-08-25 DIAGNOSIS — G5622 Lesion of ulnar nerve, left upper limb: Secondary | ICD-10-CM | POA: Diagnosis not present

## 2022-09-11 DIAGNOSIS — N289 Disorder of kidney and ureter, unspecified: Secondary | ICD-10-CM | POA: Diagnosis not present

## 2022-09-11 DIAGNOSIS — R202 Paresthesia of skin: Secondary | ICD-10-CM | POA: Diagnosis not present

## 2022-09-11 DIAGNOSIS — M62442 Contracture of muscle, left hand: Secondary | ICD-10-CM | POA: Diagnosis not present

## 2022-09-11 DIAGNOSIS — M25522 Pain in left elbow: Secondary | ICD-10-CM | POA: Diagnosis not present

## 2022-09-11 DIAGNOSIS — M25521 Pain in right elbow: Secondary | ICD-10-CM | POA: Diagnosis not present

## 2022-09-11 DIAGNOSIS — R300 Dysuria: Secondary | ICD-10-CM | POA: Diagnosis not present

## 2022-09-13 ENCOUNTER — Other Ambulatory Visit: Payer: Self-pay | Admitting: Student

## 2022-09-16 ENCOUNTER — Encounter: Payer: PPO | Admitting: Hematology

## 2022-09-16 ENCOUNTER — Inpatient Hospital Stay: Payer: PPO | Attending: Hematology | Admitting: Hematology

## 2022-09-16 VITALS — BP 133/82 | HR 76 | Temp 98.1°F | Resp 18 | Ht 66.0 in | Wt 215.6 lb

## 2022-09-16 DIAGNOSIS — Z87891 Personal history of nicotine dependence: Secondary | ICD-10-CM | POA: Diagnosis not present

## 2022-09-16 DIAGNOSIS — D72829 Elevated white blood cell count, unspecified: Secondary | ICD-10-CM

## 2022-09-16 NOTE — Patient Instructions (Addendum)
[place] at [facility] Discharge Instructions   You were seen and examined today by Dr. Delton Coombes.  He reviewed the results of your lab work which are mostly normal/stable. Your white blood cells continue to be elevated, but they are fluctuating up and down. This is mostly made up of lymphocytes.   We will see you back in a year. We will repeat lab work prior to this visit.     Thank you for choosing [place] [facility] to provide your oncology and hematology care.  To afford each patient quality time with our provider, please arrive at least 15 minutes before your scheduled appointment time.   If you have a lab appointment with the [facility] please come in thru the Main Entrance and check in at the main information desk.  You need to re-schedule your appointment should you arrive 10 or more minutes late.  We strive to give you quality time with our providers, and arriving late affects you and other patients whose appointments are after yours.  Also, if you no show three or more times for appointments you may be dismissed from the clinic at the providers discretion.     Again, thank you for choosing [facility].  Our hope is that these requests will decrease the amount of time that you wait before being seen by our physicians.       _____________________________________________________________  Should you have questions after your visit to [facility], please contact our office at [phone] and follow the prompts.  Our office hours are 8:00 a.m. and 4:30 p.m. Monday - Friday.  Please note that voicemails left after 4:00 p.m. may not be returned until the following business day.  We are closed weekends and major holidays.  You do have access to a nurse 24-7, just call the main number to the clinic [phone] and do not press any options, hold on the line and a nurse will answer the phone.    For  prescription refill requests, have your pharmacy contact our office and allow 72 hours.    Due to Covid, you will need to wear a mask upon entering the hospital. If you do not have a mask, a mask will be given to you at the Main Entrance upon arrival. For doctor visits, patients may have 1 support person age 61 or older with them. For treatment visits, patients can not have anyone with them due to social distancing guidelines and our immunocompromised population.

## 2022-09-16 NOTE — Progress Notes (Signed)
[name] [address], [place] [address]    Clinic Day:  09/16/2022  Referring physician: Redmond School, MD  Patient Care Team: Redmond School, MD as PCP - General (Internal Medicine) Derek Jack, MD as Consulting Physician (Hematology)   ASSESSMENT & PLAN:   Assessment: 1.  Leukocytosis: -Patient seen at the request of Dr. Gerarda Fraction as CBC on 10/24/2019 showed white count 14.5 with normal hemoglobin and platelets. -She reported that several of her prior CBCs have shown elevated white counts. -Labs on 12/13/2019 shows normal white count 9.5.  Mild eosinophilia. -JAK2 V617F and reflex testing, BCR/ABL was negative.  Flow cytometry was negative.   2.  Family history: -Paternal grand father had and paternal aunt had "bone cancer". -Maternal grandfather had leukemia.  Mother had breast cancer but died of acute myeloid leukemia.  Maternal aunt had metastatic cancer.  Another maternal aunt and maternal cousin died of metastatic breast cancer.   Plan: 1.  JAK2 V617F and BCR/ABL negative leukocytosis: - She was last seen in our clinic more than a year ago.  However she came back today as her recent CBC showed leukocytosis 1 08/14/2022 with white count 12.1, ALC of 3.7 and AMC of 1.2.  She did not have any infection at that time. - I have reviewed CBC from 09/11/2022: WBC-8.2, Hb-12.9, PLT-280.  Absolute eosinophil count was slightly high at 0.5 (0-0 0.4). - She does not report any B symptoms.  No recurrent infections. - Physical examination did not reveal any palpable adenopathy or splenomegaly. - She reportedly quit smoking in January 2023.  No indication for further testing. - Recommend follow-up in 1 year with repeat CBC and LDH.      Orders Placed This Encounter  Procedures   Lactate dehydrogenase    Standing Status:   Future    Standing Expiration Date:   09/16/2023   CBC with Differential    Standing Status:   Future    Standing Expiration Date:    09/16/2023     Derek Jack, MD   2/27/20245:27 PM  CHIEF COMPLAINT:   Diagnosis:  leukocytosis    Cancer Staging  No matching staging information was found for the patient.   Prior Therapy: None  Current Therapy:   Observation.     HISTORY OF PRESENT ILLNESS:   Oncology History   No history exists.     INTERVAL HISTORY:   [name] is a 67 y.o. female seen for follow-up of leukocytosis.  She came back today after more than a year as her white count in January done at her PMDs office showed leukocytosis with white count of 12.1.  She denied any infection at that time.  Denies any fevers, night sweats or weight loss in the last 1 year.  She reportedly had pneumonia in January 2023 when she quit smoking completely.   PAST MEDICAL HISTORY:   Past Medical History: Past Medical History:  Diagnosis Date   Asthma    Diverticulosis    Hypercholesteremia    Pneumonia 12/2018   double pneumonia   PONV (postoperative nausea and vomiting)    Seasonal allergies    Shingles    Varicose veins     Surgical History: Past Surgical History:  Procedure Laterality Date   ABDOMINAL HYSTERECTOMY     partial   BACK SURGERY     cervical surgery   COLONOSCOPY N/A 02/04/2017   Procedure: COLONOSCOPY;  Surgeon: Rogene Houston, MD;  [facility];  Service: Endoscopy;  Laterality: N/A;  930   ENDOSCOPIC CONCHA BULLOSA RESECTION Bilateral 03/21/2019   Procedure: ENDOSCOPIC CONCHA BULLOSA RESECTION;  Surgeon: Leta Baptist, MD;  Location: [facility];  Service: ENT;  Laterality: Bilateral;   NASAL SEPTOPLASTY W/ TURBINOPLASTY Bilateral 03/21/2019   Procedure: NASAL SEPTOPLASTY WITH BILATERAL TURBINATE REDUCTION;  Surgeon: Leta Baptist, MD;  Location: [facility];  Service: ENT;  Laterality: Bilateral;   TONSILLECTOMY     WRIST SURGERY     age 60yr    Social History: Social History   Socioeconomic History   Marital status: Widowed    Spouse name:  Not on file   Number of children: 0   Years of education: Not on file   Highest education level: Not on file  Occupational History   Not on file  Tobacco Use   Smoking status: Former    Packs/day: 0.50    Years: 45.00    Total pack years: 22.50    Types: Cigarettes    Quit date: 07/20/2021    Years since quitting: 1.1   Smokeless tobacco: Never  Vaping Use   Vaping Use: Former  Substance and Sexual Activity   Alcohol use: Yes    Alcohol/week: 0.0 standard drinks of alcohol    Comment: occ   Drug use: No   Sexual activity: Not Currently    Birth control/protection: Post-menopausal  Other Topics Concern   Not on file  Social History Narrative   Not on file   Social Determinants of Health   Financial Resource Strain: Low Risk  (06/27/2020)   Overall Financial Resource Strain (CARDIA)    Difficulty of Paying Living Expenses: Not hard at all  Food Insecurity: No Food Insecurity (06/27/2020)   Hunger Vital Sign    Worried About Running Out of Food in the Last Year: Never true    Ran Out of Food in the Last Year: Never true  Transportation Needs: No Transportation Needs (06/27/2020)   PRAPARE - THydrologist(Medical): No    Lack of Transportation (Non-Medical): No  Physical Activity: Inactive (06/27/2020)   Exercise Vital Sign    Days of Exercise per Week: 0 days    Minutes of Exercise per Session: 0 min  Stress: No Stress Concern Present (06/27/2020)   [place]   Feeling of Stress : Not at all  Social Connections: Moderately Integrated (06/27/2020)   Social Connection and Isolation Panel [NHANES]    Frequency of Communication with Friends and Family: More than three times a week    Frequency of Social Gatherings with Friends and Family: Three times a week    Attends Religious Services: 1 to 4 times per year    Active Member of Clubs or Organizations: No    Attends CTheatre managerMeetings: Never    Marital Status: Married  IHuman resources officerViolence: Not At Risk (06/27/2020)   Humiliation, Afraid, Rape, and Kick questionnaire    Fear of Current or Ex-Partner: No    Emotionally Abused: No    Physically Abused: No    Sexually Abused: No    Family History: Family History  Problem Relation Age of Onset   Breast cancer Mother    Cancer Mother        breast   Heart attack Mother    Acute myelogenous leukemia Mother    Gallbladder disease Father    Thyroid cancer Maternal Aunt  Brain cancer Maternal Aunt    Breast cancer Maternal Aunt    Lung cancer Maternal Aunt    Bone cancer Paternal Aunt    Diabetes Maternal Grandmother    Dementia Maternal Grandmother    Leukemia Maternal Grandfather    Bone cancer Paternal Grandmother    Breast cancer Cousin    Diabetes Brother    Skin cancer Brother    Diabetes Brother    Colon cancer Neg Hx     Current Medications:  Current Outpatient Medications:    ANORO ELLIPTA 62.5-25 MCG/ACT AEPB, INHALE 1 PUFF INTO THE LUNGS ONCE DAILY., Disp: 60 each, Rfl: 0   aspirin 81 MG EC tablet, Take 81 mg by mouth every other day., Disp: , Rfl:    B Complex-C (SUPER B COMPLEX PO), Take 1 tablet by mouth daily., Disp: , Rfl:    cetirizine (ZYRTEC ALLERGY) 10 MG tablet, Take 1 tablet (10 mg total) by mouth daily., Disp: 90 tablet, Rfl: 3   Cholecalciferol (VITAMIN D3) 25 MCG (1000 UT) CAPS, Take 1 capsule by mouth daily., Disp: , Rfl:    Cranberry 1000 MG CAPS, Take 1 capsule by mouth daily., Disp: , Rfl:    estradiol (ESTRACE) 0.1 MG/GM vaginal cream, Place 0.5 Applicatorfuls vaginally See admin instructions. Apply every other night (Patient taking differently: Place 0.5 Applicatorfuls vaginally See admin instructions. Apply 2 times a week at night), Disp: 45 g, Rfl: 3   fluconazole (DIFLUCAN) 100 MG tablet, TAKE (1) TABLET BY MOUTH ONCE DAILY FOR (7) DAYS., Disp: 7 tablet, Rfl: 0   Garlic 123XX123 MG CAPS, Take 1 capsule  by mouth daily., Disp: , Rfl:    magic mouthwash (nystatin, lidocaine, diphenhydrAMINE) suspension, Take 5 mLs by mouth 3 (three) times daily as needed for mouth pain., Disp: 180 mL, Rfl: 0   Multiple Vitamins-Minerals (MULTIVITAMIN ADULTS PO), Take 1 tablet by mouth daily., Disp: , Rfl:    nystatin (MYCOSTATIN) 100000 UNIT/ML suspension, Take 5 mLs (500,000 Units total) by mouth 4 (four) times daily. Swish and spit, retain in mouth for as long as tolerated, Disp: 473 mL, Rfl: 0   Omega-3 Fatty Acids (FISH OIL) 1000 MG CAPS, Take 1 capsule by mouth daily., Disp: , Rfl:    umeclidinium-vilanterol (ANORO ELLIPTA) 62.5-25 MCG/ACT AEPB, Inhale 1 puff into the lungs daily., Disp: 7 each, Rfl: 0   Allergies: Allergies  Allergen Reactions   Bee Venom Anaphylaxis   Codeine Nausea And Vomiting   Other Nausea And Vomiting    General anesthesia    Propofol Nausea And Vomiting    REVIEW OF SYSTEMS:   Review of Systems  Genitourinary:  Positive for difficulty urinating.   Neurological:  Positive for numbness.  All other systems reviewed and are negative.    VITALS:   Blood pressure 133/82, pulse 76, temperature 98.1 F (36.7 C), temperature source Oral, resp. rate 18, height '5\' 6"'$  (1.676 m), weight 215 lb 9.6 oz (97.8 kg), SpO2 97 %.  Wt Readings from Last 3 Encounters:  09/16/22 215 lb 9.6 oz (97.8 kg)  02/26/22 214 lb 12.8 oz (97.4 kg)  10/22/21 206 lb 12.8 oz (93.8 kg)    Body mass index is 34.8 kg/m.  Performance status (ECOG): 1 - Symptomatic but completely ambulatory  PHYSICAL EXAM:   Physical Exam Vitals reviewed.  Constitutional:      Appearance: Normal appearance.  Cardiovascular:     Rate and Rhythm: Normal rate and regular rhythm.     Pulses: Normal pulses.  Heart sounds: Normal heart sounds.  Pulmonary:     Effort: Pulmonary effort is normal.     Breath sounds: Normal breath sounds.  Lymphadenopathy:     Cervical: No cervical adenopathy.  Neurological:      Mental Status: She is alert.  Psychiatric:        Mood and Affect: Mood normal.        Behavior: Behavior normal.     LABS:      Latest Ref Rng & Units 07/26/2021    6:18 AM 07/25/2021    4:53 AM 07/24/2021    5:39 AM  CBC  WBC 4.0 - 10.5 K/uL 14.0  15.6  17.3   Hemoglobin 12.0 - 15.0 g/dL 10.5  9.5  10.2   Hematocrit 36.0 - 46.0 % 31.4  28.8  30.3   Platelets 150 - 400 K/uL 325  226  209       Latest Ref Rng & Units 07/25/2021    4:53 AM 07/24/2021    5:39 AM 07/23/2021   11:15 AM  CMP  Glucose 70 - 99 mg/dL 109  124  145   BUN 8 - 23 mg/dL '20  23  25   '$ Creatinine 0.44 - 1.00 mg/dL 0.81  0.99  1.05   Sodium 135 - 145 mmol/L 134  131  131   Potassium 3.5 - 5.1 mmol/L 4.0  3.3  3.1   Chloride 98 - 111 mmol/L 103  101  98   CO2 22 - 32 mmol/L '24  22  22   '$ Calcium 8.9 - 10.3 mg/dL 8.2  7.7  7.9   Total Protein 6.5 - 8.1 g/dL   6.4   Total Bilirubin 0.3 - 1.2 mg/dL   2.0   Alkaline Phos 38 - 126 U/L   106   AST 15 - 41 U/L   29   ALT 0 - 44 U/L   25      No results found for: "CEA1", "CEA" / No results found for: "CEA1", "CEA" No results found for: "PSA1" No results found for: "[id number]" No results found for: "CAN125"  No results found for: "TOTALPROTELP", "ALBUMINELP", "A1GS", "A2GS", "BETS", "BETA2SER", "GAMS", "MSPIKE", "SPEI" Lab Results  Component Value Date   TIBC 201 (L) 07/23/2021   FERRITIN 265 07/23/2021   IRONPCTSAT 5 (L) 07/23/2021   Lab Results  Component Value Date   LDH 168 06/20/2020   LDH 172 12/13/2019     STUDIES:   No results found.

## 2022-09-26 DIAGNOSIS — G5622 Lesion of ulnar nerve, left upper limb: Secondary | ICD-10-CM | POA: Diagnosis not present

## 2022-10-14 ENCOUNTER — Other Ambulatory Visit: Payer: Self-pay | Admitting: Student

## 2022-11-07 ENCOUNTER — Encounter: Payer: PPO | Admitting: Radiology

## 2022-11-14 ENCOUNTER — Other Ambulatory Visit: Payer: Self-pay | Admitting: Student

## 2022-11-17 ENCOUNTER — Telehealth: Payer: Self-pay | Admitting: Pulmonary Disease

## 2022-11-17 NOTE — Telephone Encounter (Signed)
PT needs Anoro refill. Pharm referred her to [place].  PT has made FU appt.   Pharm: [facility]  PT' s # [phone] or cell @ [phone]

## 2022-11-18 ENCOUNTER — Other Ambulatory Visit: Payer: Self-pay

## 2022-11-18 MED ORDER — ANORO ELLIPTA 62.5-25 MCG/ACT IN AEPB: INHALATION_SPRAY | RESPIRATORY_TRACT | 0 refills | Status: DC

## 2022-11-18 NOTE — Telephone Encounter (Signed)
ATC X1 LVM for patient to call the office back. Please advise Anoro inhaler has been sent to pharmacy. And please advise to keep f/u with Dr. Isaiah Serge

## 2022-11-19 ENCOUNTER — Ambulatory Visit: Payer: PPO | Admitting: Pulmonary Disease

## 2022-11-19 ENCOUNTER — Encounter: Payer: Self-pay | Admitting: Pulmonary Disease

## 2022-11-19 VITALS — BP 132/80 | HR 66 | Temp 97.6°F | Ht 66.0 in | Wt 221.2 lb

## 2022-11-19 DIAGNOSIS — J4489 Other specified chronic obstructive pulmonary disease: Secondary | ICD-10-CM

## 2022-11-19 MED ORDER — ANORO ELLIPTA 62.5-25 MCG/ACT IN AEPB: INHALATION_SPRAY | RESPIRATORY_TRACT | 3 refills | Status: DC

## 2022-11-19 NOTE — Telephone Encounter (Signed)
Pt has an OV scheduled today 5/1 with Dr. Isaiah Serge. Refill will be sent during that OV.

## 2022-11-19 NOTE — Patient Instructions (Signed)
I am glad you are doing well with regard to breathing Continue the Anoro Follow-up in 1 year

## 2022-11-19 NOTE — Progress Notes (Signed)
[name]    [id number]    10/01/1955  Primary Care Physician:Fusco, Lyman Bishop, MD  Referring Physician: Elfredia Nevins, MD [address] [place],  [place] [address]    Chief complaint: Follow-up for asthma, COPD  HPI: 67 y.o.  ex smoker with COPD, asthma, seasonal allergies Developed dyspnea, cough, hypoxia after receiving the first dose of Moderna vaccine in February.  Treated with supplemental oxygen and steroids.  Reports pneumonia x2 last year and thinks she may had Covid infection at that time.  Seen by my partner Dr. Katrinka Blazing around May 2021 and diagnosed with asthma, possible allergic reaction to the vaccine.   Evaluated by hematology in May 2021 for leukocytosis with no evidence of myeloproliferative disorder  She was hospitalized in early December 2022 for community-acquired pneumonia with right upper lobe infiltrate on chest x-ray treated with empiric IV antibiotics and fluids.  She was briefly on supplemental oxygen but did not require oxygen to go home. Overall she is slowly improving but continues to have weakness and fatigue.  She finally quit smoking for New Year's 2023 resolution  Has recurrent episodes of bronchitis, asthma exacerbations since November 2022.  She has been treated with Z-Pak, amoxicillin and 2 rounds of prednisone with minimal improvement.  She was prescribed Singulair but elected not to take it after she read the possible side effects  Pets: [place] a farm with rescue horses and dogs Occupation: Visual merchandiser Exposures: No known exposures.  No mold, hot tub, Jacuzzi Smoking history: 23-pack-year smoker.  Quit smoking in December 2022 Travel history: No significant travel history Relevant family history: No significant family issue of lung disease.  Interim History: States that breathing is doing well with no issues She likes Anoro better than Trelegy.  Outpatient Encounter Medications as of 11/19/2022  Medication Sig   aspirin 81 MG  EC tablet Take 81 mg by mouth every other day.   B Complex-C (SUPER B COMPLEX PO) Take 1 tablet by mouth daily.   cetirizine (ZYRTEC ALLERGY) 10 MG tablet Take 1 tablet (10 mg total) by mouth daily.   Cholecalciferol (VITAMIN D3) 25 MCG (1000 UT) CAPS Take 1 capsule by mouth daily.   Cranberry 1000 MG CAPS Take 1 capsule by mouth daily.   fluconazole (DIFLUCAN) 100 MG tablet TAKE (1) TABLET BY MOUTH ONCE DAILY FOR (7) DAYS.   Garlic 1000 MG CAPS Take 1 capsule by mouth daily.   magic mouthwash (nystatin, lidocaine, diphenhydrAMINE) suspension Take 5 mLs by mouth 3 (three) times daily as needed for mouth pain.   Multiple Vitamins-Minerals (MULTIVITAMIN ADULTS PO) Take 1 tablet by mouth daily.   nystatin (MYCOSTATIN) 100000 UNIT/ML suspension Take 5 mLs (500,000 Units total) by mouth 4 (four) times daily. Swish and spit, retain in mouth for as long as tolerated   Omega-3 Fatty Acids (FISH OIL) 1000 MG CAPS Take 1 capsule by mouth daily.   [DISCONTINUED] umeclidinium-vilanterol (ANORO ELLIPTA) 62.5-25 MCG/ACT AEPB INHALE 1 PUFF INTO THE LUNGS ONCE DAILY.   umeclidinium-vilanterol (ANORO ELLIPTA) 62.5-25 MCG/ACT AEPB INHALE 1 PUFF INTO THE LUNGS ONCE DAILY.   [DISCONTINUED] ANORO ELLIPTA 62.5-25 MCG/ACT AEPB INHALE 1 PUFF INTO THE LUNGS ONCE DAILY.   [DISCONTINUED] estradiol (ESTRACE) 0.1 MG/GM vaginal cream Place 0.5 Applicatorfuls vaginally See admin instructions. Apply every other night (Patient taking differently: Place 0.5 Applicatorfuls vaginally See admin instructions. Apply 2 times a week at night)   [DISCONTINUED] umeclidinium-vilanterol (ANORO ELLIPTA) 62.5-25 MCG/ACT AEPB Inhale 1 puff into  the lungs daily.   No facility-administered encounter medications on file as of 11/19/2022.   Physical Exam: Blood pressure 132/80, pulse 66, temperature 97.6 F (36.4 C), temperature source Oral, height 5\' 6"  (1.676 m), weight 221 lb 3.2 oz (100.3 kg), SpO2 100 %. Gen:      No acute distress HEENT:   EOMI, sclera anicteric Neck:     No masses; no thyromegaly Lungs:    Clear to auscultation bilaterally; normal respiratory effort CV:         Regular rate and rhythm; no murmurs Abd:      + bowel sounds; soft, non-tender; no palpable masses, no distension Ext:    No edema; adequate peripheral perfusion Skin:      Warm and dry; no rash Neuro: alert and oriented x 3 Psych: normal mood and affect   Data Reviewed: Imaging: CT chest 01/16/2020-centrilobular emphysema, subcentimeter pulmonary nodule. Screening CT chest 02/15/2021-centrilobular emphysema, stable lung nodules Screening CT chest 02/18/2022-centrilobular and paraseptal emphysema, stable lung nodules. I have reviewed the images personally  PFTs: 01/27/2020 FVC 2.98 [88%], FEV1 2.30 [88%], F/F 77, TLC 5.47 [102+], DLCO 20.17 [95%] Normal test  Labs: CBC 01/27/2020-WBC 10.7, eos 2.4%, absolute eosinophil count 364 IgE 01/27/2020-428  Assessment:  Asthma-COPD overlap syndrome Recent hospitalization for right upper lobe pneumonia with follow-up chest x-ray showing resolution Inhaler changed from Trelegy to Anoro to prevent recurrent pneumonia.  She is doing well with Anoro Does not want to take Singulair due to fear of side effects  Pulmonary nodules Continue annual screening CT chest.  Ex-smoker Congratulated on quitting smoking  Plan/Recommendations: Continue Anoro Albuterol, duo nebs Singulair  Chilton Greathouse MD [facility] 11/19/2022, 1:48 PM  CC: Elfredia Nevins, MD

## 2022-12-26 ENCOUNTER — Ambulatory Visit (INDEPENDENT_AMBULATORY_CARE_PROVIDER_SITE_OTHER): Payer: PPO | Admitting: Obstetrics and Gynecology

## 2022-12-26 ENCOUNTER — Encounter: Payer: Self-pay | Admitting: Obstetrics and Gynecology

## 2022-12-26 VITALS — BP 156/94 | HR 63 | Ht 66.0 in | Wt 222.0 lb

## 2022-12-26 DIAGNOSIS — Z01419 Encounter for gynecological examination (general) (routine) without abnormal findings: Secondary | ICD-10-CM | POA: Diagnosis not present

## 2022-12-26 MED ORDER — ESTRADIOL 0.025 MG/24HR TD PTWK: 0.0250 mg | MEDICATED_PATCH | TRANSDERMAL | 12 refills | Status: DC

## 2022-12-26 NOTE — Progress Notes (Addendum)
Subjective:     [name] is a 67 y.o. female P3 postmenopausal with BMI 35 who is here for a comprehensive physical exam. The patient reports weight gain over the past year. She reports quitting smoking at the beginning of the year and gaining weight ever since. She has not changed her daily activities. She works on a farm daily. She eats one meal a day which has been her norm. She admits to loving bread.She reports a new diagnosis of HTN. She reports vasomotor symptoms previously well managed with estrace. Patient is not sexually active. She reports some urinary incontinence with the use of lasix. She desires to use patch for estrogen and repeat blood testing. Patient had a hysterectomy due to abnormal uterine bleeding and 2 bladder suspension procedures  Past Medical History:  Diagnosis Date   Asthma    Diverticulosis    Hypercholesteremia    Pneumonia 12/2018   double pneumonia   PONV (postoperative nausea and vomiting)    Seasonal allergies    Shingles    Varicose veins    Past Surgical History:  Procedure Laterality Date   ABDOMINAL HYSTERECTOMY     partial   BACK SURGERY     cervical surgery   COLONOSCOPY N/A 02/04/2017   Procedure: COLONOSCOPY;  Surgeon: Malissa Hippo, MD;  [facility];  Service: Endoscopy;  Laterality: N/A;  930   ENDOSCOPIC CONCHA BULLOSA RESECTION Bilateral 03/21/2019   Procedure: ENDOSCOPIC CONCHA BULLOSA RESECTION;  Surgeon: Newman Pies, MD;  Location: [facility];  Service: ENT;  Laterality: Bilateral;   NASAL SEPTOPLASTY W/ TURBINOPLASTY Bilateral 03/21/2019   Procedure: NASAL SEPTOPLASTY WITH BILATERAL TURBINATE REDUCTION;  Surgeon: Newman Pies, MD;  Location: [facility];  Service: ENT;  Laterality: Bilateral;   TONSILLECTOMY     WRIST SURGERY     age 25yrs   Family History  Problem Relation Age of Onset   Breast cancer Mother    Cancer Mother        breast   Heart attack Mother    Acute myelogenous  leukemia Mother    Gallbladder disease Father    Thyroid cancer Maternal Aunt    Brain cancer Maternal Aunt    Breast cancer Maternal Aunt    Lung cancer Maternal Aunt    Bone cancer Paternal Aunt    Diabetes Maternal Grandmother    Dementia Maternal Grandmother    Leukemia Maternal Grandfather    Bone cancer Paternal Grandmother    Breast cancer Cousin    Diabetes Brother    Skin cancer Brother    Diabetes Brother    Colon cancer Neg Hx     Social History   Socioeconomic History   Marital status: Widowed    Spouse name: Not on file   Number of children: 0   Years of education: Not on file   Highest education level: Not on file  Occupational History   Not on file  Tobacco Use   Smoking status: Former    Packs/day: 0.50    Years: 45.00    Additional pack years: 0.00    Total pack years: 22.50    Types: Cigarettes    Quit date: 07/20/2021    Years since quitting: 1.4   Smokeless tobacco: Never  Vaping Use   Vaping Use: Former  Substance and Sexual Activity   Alcohol use: Yes    Alcohol/week: 0.0 standard drinks of alcohol    Comment: occ   Drug use:  No   Sexual activity: Not Currently    Birth control/protection: Post-menopausal  Other Topics Concern   Not on file  Social History Narrative   Not on file   Social Determinants of Health   Financial Resource Strain: Low Risk  (06/27/2020)   Overall Financial Resource Strain (CARDIA)    Difficulty of Paying Living Expenses: Not hard at all  Food Insecurity: No Food Insecurity (06/27/2020)   Hunger Vital Sign    Worried About Running Out of Food in the Last Year: Never true    Ran Out of Food in the Last Year: Never true  Transportation Needs: No Transportation Needs (06/27/2020)   PRAPARE - Administrator, Civil Service (Medical): No    Lack of Transportation (Non-Medical): No  Physical Activity: Inactive (06/27/2020)   Exercise Vital Sign    Days of Exercise per Week: 0 days    Minutes of Exercise  per Session: 0 min  Stress: No Stress Concern Present (06/27/2020)   [employer] of Occupational Health - Occupational Stress Questionnaire    Feeling of Stress : Not at all  Social Connections: Moderately Integrated (06/27/2020)   Social Connection and Isolation Panel [NHANES]    Frequency of Communication with Friends and Family: More than three times a week    Frequency of Social Gatherings with Friends and Family: Three times a week    Attends Religious Services: 1 to 4 times per year    Active Member of Clubs or Organizations: No    Attends Banker Meetings: Never    Marital Status: Married  Catering manager Violence: Not At Risk (06/27/2020)   Humiliation, Afraid, Rape, and Kick questionnaire    Fear of Current or Ex-Partner: No    Emotionally Abused: No    Physically Abused: No    Sexually Abused: No   Health Maintenance  Topic Date Due   Hepatitis C Screening  Never done   Zoster Vaccines- Shingrix (1 of 2) Never done   DTaP/Tdap/Td (1 - Tdap) 06/20/2021   COVID-19 Vaccine (2 - 2023-24 season) 03/21/2022   Pneumonia Vaccine 51+ Years old (2 of 2 - PCV) 06/19/2022   Medicare Annual Wellness (AWV)  08/01/2022   Lung Cancer Screening  02/18/2023   INFLUENZA VACCINE  02/19/2023   MAMMOGRAM  05/21/2023   Colonoscopy  02/05/2027   DEXA SCAN  Completed   HPV VACCINES  Aged Out       Review of Systems Pertinent items noted in HPI and remainder of comprehensive ROS otherwise negative.   Objective:    Blood pressure (!) 166/82, pulse 75, height 5\' 6"  (1.676 m), weight 222 lb (100.7 kg). GENERAL: Well-developed, well-nourished female in no acute distress.  HEENT: Normocephalic, atraumatic. Sclerae anicteric.  NECK: Supple. Normal thyroid.  LUNGS: Clear to auscultation bilaterally.  HEART: Regular rate and rhythm. BREASTS: Symmetric in size. No palpable masses or lymphadenopathy, skin changes, or nipple drainage. ABDOMEN: Soft, nontender, nondistended. No  organomegaly. PELVIC: Normal external female genitalia. Vagina is pink and rugated.  Normal discharge. Vaginal vault intact. No adnexal mass or tenderness. Chaperone present during the pelvic exam EXTREMITIES: No cyanosis, clubbing, or edema, 2+ distal pulses.     Assessment:    Healthy female exam.      Plan:    Pap smear not indicated Screening mammogram ordered Patient current on colonoscopy Health maintenance labs ordered Rx [place] ordered Patient declined referral to nutritionist Patient will be contacted with abnormal results Patient to follow  up with PCP regarding elevated BP today and other medical diagnosis See After Visit Summary for Counseling Recommendations

## 2022-12-26 NOTE — Progress Notes (Signed)
67 y.o. New GYN presents for AEX.  C/o swollen and pain in breast, ankles, bloating, back pain, pelvic/abdominal 10/10 x 8

## 2022-12-27 LAB — COMPREHENSIVE METABOLIC PANEL
ALT: 25 IU/L (ref 0–32)
AST: 26 IU/L (ref 0–40)
Albumin/Globulin Ratio: 1.8 (ref 1.2–2.2)
Albumin: 4.2 g/dL (ref 3.9–4.9)
Alkaline Phosphatase: 85 IU/L (ref 44–121)
BUN/Creatinine Ratio: 15 (ref 12–28)
BUN: 15 mg/dL (ref 8–27)
Bilirubin Total: 0.3 mg/dL (ref 0.0–1.2)
CO2: 25 mmol/L (ref 20–29)
Calcium: 9.2 mg/dL (ref 8.7–10.3)
Chloride: 105 mmol/L (ref 96–106)
Creatinine, Ser: 0.99 mg/dL (ref 0.57–1.00)
Globulin, Total: 2.4 g/dL (ref 1.5–4.5)
Glucose: 96 mg/dL (ref 70–99)
Potassium: 4.7 mmol/L (ref 3.5–5.2)
Sodium: 142 mmol/L (ref 134–144)
Total Protein: 6.6 g/dL (ref 6.0–8.5)
eGFR: 62 mL/min/{1.73_m2} (ref >59)

## 2022-12-27 LAB — HEMOGLOBIN A1C
Est. average glucose Bld gHb Est-mCnc: 134 mg/dL
Hgb A1c MFr Bld: 6.3 % — ABNORMAL HIGH (ref 4.8–5.6)

## 2022-12-27 LAB — LIPID PANEL
Chol/HDL Ratio: 4.3 ratio (ref 0.0–4.4)
Cholesterol, Total: 225 mg/dL — ABNORMAL HIGH (ref 100–199)
HDL: 52 mg/dL (ref >39)
LDL Chol Calc (NIH): 140 mg/dL — ABNORMAL HIGH (ref 0–99)
Triglycerides: 185 mg/dL — ABNORMAL HIGH (ref 0–149)
VLDL Cholesterol Cal: 33 mg/dL (ref 5–40)

## 2022-12-27 LAB — CBC
Hematocrit: 39.4 % (ref 34.0–46.6)
Hemoglobin: 12.8 g/dL (ref 11.1–15.9)
MCH: 29.4 pg (ref 26.6–33.0)
MCHC: 32.5 g/dL (ref 31.5–35.7)
MCV: 91 fL (ref 79–97)
Platelets: 264 10*3/uL (ref 150–450)
RBC: 4.35 x10E6/uL (ref 3.77–5.28)
RDW: 13.1 % (ref 11.7–15.4)
WBC: 7.2 10*3/uL (ref 3.4–10.8)

## 2022-12-27 LAB — TSH: TSH: 2.85 u[IU]/mL (ref 0.450–4.500)

## 2023-01-05 DIAGNOSIS — E6609 Other obesity due to excess calories: Secondary | ICD-10-CM | POA: Diagnosis not present

## 2023-01-05 DIAGNOSIS — J4550 Severe persistent asthma, uncomplicated: Secondary | ICD-10-CM | POA: Diagnosis not present

## 2023-01-05 DIAGNOSIS — Z6834 Body mass index (BMI) 34.0-34.9, adult: Secondary | ICD-10-CM | POA: Diagnosis not present

## 2023-01-05 DIAGNOSIS — E2749 Other adrenocortical insufficiency: Secondary | ICD-10-CM | POA: Diagnosis not present

## 2023-01-05 DIAGNOSIS — R600 Localized edema: Secondary | ICD-10-CM | POA: Diagnosis not present

## 2023-01-05 DIAGNOSIS — N289 Disorder of kidney and ureter, unspecified: Secondary | ICD-10-CM | POA: Diagnosis not present

## 2023-01-05 DIAGNOSIS — N62 Hypertrophy of breast: Secondary | ICD-10-CM | POA: Diagnosis not present

## 2023-01-05 DIAGNOSIS — R7309 Other abnormal glucose: Secondary | ICD-10-CM | POA: Diagnosis not present

## 2023-01-05 DIAGNOSIS — R635 Abnormal weight gain: Secondary | ICD-10-CM | POA: Diagnosis not present

## 2023-01-08 ENCOUNTER — Encounter: Payer: Self-pay | Admitting: Obstetrics and Gynecology

## 2023-01-08 DIAGNOSIS — N951 Menopausal and female climacteric states: Secondary | ICD-10-CM

## 2023-01-09 MED ORDER — ESTRADIOL 0.0375 MG/24HR TD PTWK
0.0375 mg | MEDICATED_PATCH | TRANSDERMAL | 2 refills | Status: AC
Start: 2023-01-09 — End: ?

## 2023-01-12 IMAGING — DX DG CHEST 1V PORT
1 series · 1 of 1 positions shown · non-contrast
Comparison: 07/03/2021

CLINICAL DATA: Question sepsis

EXAM:
PORTABLE CHEST 1 VIEW

[chest ap]
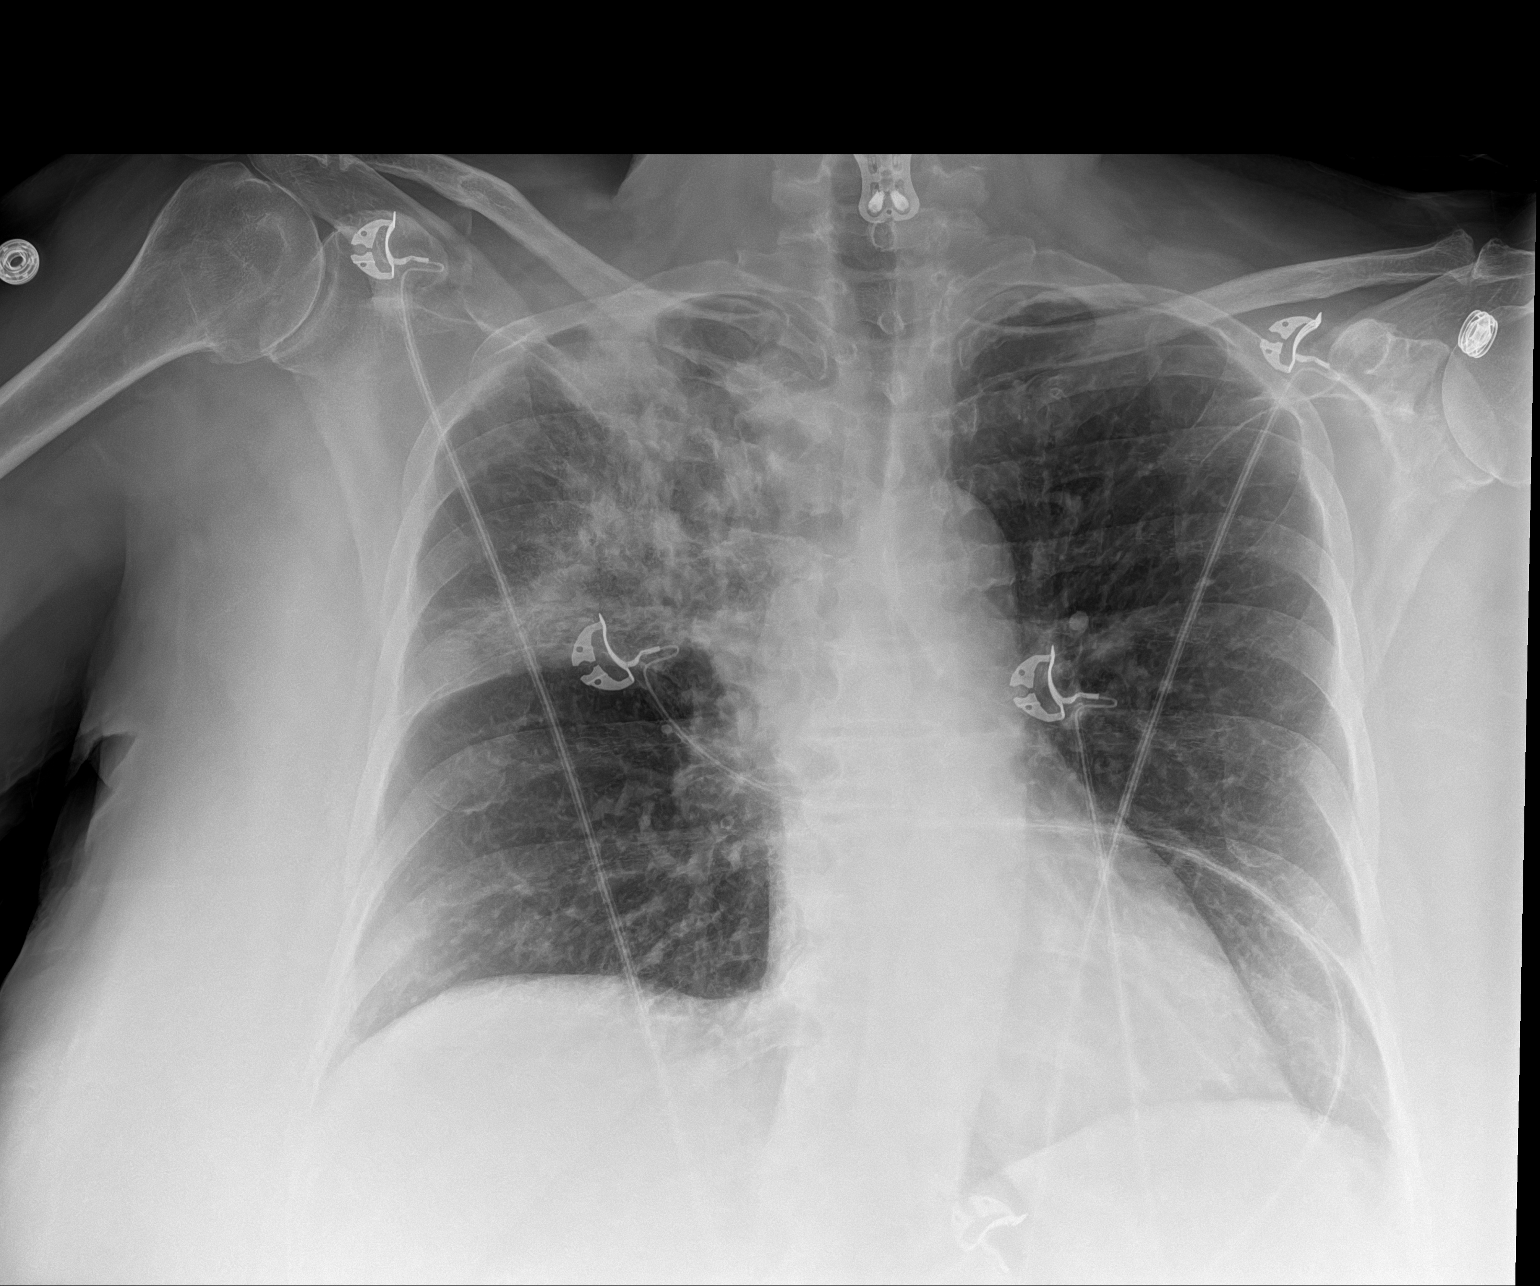

[1 of 1 positions shown; findings below may reference images not displayed]

FINDINGS: Interval development of right upper lobe airspace disease. Probable
pneumonia based on the rapid development and symptoms.

Left lung clear. No effusion identified. Heart size and vascularity
normal.
IMPRESSION: Right upper lobe infiltrate likely pneumonia. Followup PA and
lateral chest X-ray is recommended in 3-4 weeks following trial of
antibiotic therapy to ensure resolution and exclude underlying
malignancy.

## 2023-02-20 ENCOUNTER — Ambulatory Visit (HOSPITAL_COMMUNITY)
Admission: RE | Admit: 2023-02-20 | Discharge: 2023-02-20 | Disposition: A | Discharge status: Home / Self Care | Payer: PPO | Source: Ambulatory Visit | Attending: Acute Care | Admitting: Acute Care

## 2023-02-20 DIAGNOSIS — J439 Emphysema, unspecified: Secondary | ICD-10-CM | POA: Insufficient documentation

## 2023-02-20 DIAGNOSIS — Z122 Encounter for screening for malignant neoplasm of respiratory organs: Secondary | ICD-10-CM | POA: Diagnosis not present

## 2023-02-20 DIAGNOSIS — I700 Atherosclerosis of aorta: Secondary | ICD-10-CM | POA: Diagnosis not present

## 2023-02-20 DIAGNOSIS — Z87891 Personal history of nicotine dependence: Secondary | ICD-10-CM | POA: Diagnosis not present

## 2023-02-20 DIAGNOSIS — K760 Fatty (change of) liver, not elsewhere classified: Secondary | ICD-10-CM | POA: Diagnosis not present

## 2023-02-27 ENCOUNTER — Other Ambulatory Visit: Payer: Self-pay | Admitting: Acute Care

## 2023-02-27 DIAGNOSIS — Z122 Encounter for screening for malignant neoplasm of respiratory organs: Secondary | ICD-10-CM

## 2023-02-27 DIAGNOSIS — Z87891 Personal history of nicotine dependence: Secondary | ICD-10-CM

## 2023-03-09 ENCOUNTER — Ambulatory Visit (HOSPITAL_COMMUNITY)
Admission: RE | Admit: 2023-03-09 | Discharge: 2023-03-09 | Disposition: A | Discharge status: Home / Self Care | Payer: PPO | Source: Ambulatory Visit | Attending: Obstetrics and Gynecology | Admitting: Obstetrics and Gynecology

## 2023-03-09 DIAGNOSIS — Z01419 Encounter for gynecological examination (general) (routine) without abnormal findings: Secondary | ICD-10-CM | POA: Insufficient documentation

## 2023-03-09 DIAGNOSIS — Z1231 Encounter for screening mammogram for malignant neoplasm of breast: Secondary | ICD-10-CM | POA: Insufficient documentation

## 2023-03-19 DIAGNOSIS — N951 Menopausal and female climacteric states: Secondary | ICD-10-CM | POA: Diagnosis not present

## 2023-03-19 DIAGNOSIS — Z6835 Body mass index (BMI) 35.0-35.9, adult: Secondary | ICD-10-CM | POA: Diagnosis not present

## 2023-03-19 DIAGNOSIS — N62 Hypertrophy of breast: Secondary | ICD-10-CM | POA: Diagnosis not present

## 2023-03-19 DIAGNOSIS — E2749 Other adrenocortical insufficiency: Secondary | ICD-10-CM | POA: Diagnosis not present

## 2023-03-19 DIAGNOSIS — J4550 Severe persistent asthma, uncomplicated: Secondary | ICD-10-CM | POA: Diagnosis not present

## 2023-03-19 DIAGNOSIS — M549 Dorsalgia, unspecified: Secondary | ICD-10-CM | POA: Diagnosis not present

## 2023-03-19 DIAGNOSIS — I10 Essential (primary) hypertension: Secondary | ICD-10-CM | POA: Diagnosis not present

## 2023-03-19 DIAGNOSIS — E6609 Other obesity due to excess calories: Secondary | ICD-10-CM | POA: Diagnosis not present

## 2023-03-19 DIAGNOSIS — R7309 Other abnormal glucose: Secondary | ICD-10-CM | POA: Diagnosis not present

## 2023-03-24 DIAGNOSIS — I10 Essential (primary) hypertension: Secondary | ICD-10-CM | POA: Diagnosis not present

## 2023-03-24 DIAGNOSIS — N951 Menopausal and female climacteric states: Secondary | ICD-10-CM | POA: Diagnosis not present

## 2023-03-24 DIAGNOSIS — Z9229 Personal history of other drug therapy: Secondary | ICD-10-CM | POA: Diagnosis not present

## 2023-03-24 DIAGNOSIS — R7309 Other abnormal glucose: Secondary | ICD-10-CM | POA: Diagnosis not present

## 2023-04-08 ENCOUNTER — Ambulatory Visit (HOSPITAL_COMMUNITY)
Admission: RE | Admit: 2023-04-08 | Discharge: 2023-04-08 | Disposition: A | Discharge status: Home / Self Care | Payer: PPO | Source: Ambulatory Visit | Attending: Internal Medicine | Admitting: Internal Medicine

## 2023-04-08 ENCOUNTER — Other Ambulatory Visit (HOSPITAL_COMMUNITY): Payer: Self-pay | Admitting: Internal Medicine

## 2023-04-08 DIAGNOSIS — M25561 Pain in right knee: Secondary | ICD-10-CM | POA: Diagnosis not present

## 2023-04-08 DIAGNOSIS — S82001A Unspecified fracture of right patella, initial encounter for closed fracture: Secondary | ICD-10-CM | POA: Diagnosis not present

## 2023-04-08 DIAGNOSIS — M1711 Unilateral primary osteoarthritis, right knee: Secondary | ICD-10-CM | POA: Diagnosis not present

## 2023-04-08 DIAGNOSIS — M25461 Effusion, right knee: Secondary | ICD-10-CM | POA: Diagnosis not present

## 2023-04-10 ENCOUNTER — Emergency Department (HOSPITAL_COMMUNITY): Payer: PPO

## 2023-04-10 ENCOUNTER — Emergency Department (HOSPITAL_COMMUNITY)
Admission: EM | Admit: 2023-04-10 | Discharge: 2023-04-11 | Disposition: A | Discharge status: Home / Self Care | Payer: PPO | Attending: Emergency Medicine | Admitting: Emergency Medicine

## 2023-04-10 ENCOUNTER — Other Ambulatory Visit: Payer: Self-pay

## 2023-04-10 DIAGNOSIS — M7989 Other specified soft tissue disorders: Secondary | ICD-10-CM | POA: Diagnosis not present

## 2023-04-10 DIAGNOSIS — W108XXA Fall (on) (from) other stairs and steps, initial encounter: Secondary | ICD-10-CM | POA: Diagnosis not present

## 2023-04-10 DIAGNOSIS — R600 Localized edema: Secondary | ICD-10-CM | POA: Diagnosis not present

## 2023-04-10 DIAGNOSIS — S82024A Nondisplaced longitudinal fracture of right patella, initial encounter for closed fracture: Secondary | ICD-10-CM | POA: Diagnosis not present

## 2023-04-10 DIAGNOSIS — J45909 Unspecified asthma, uncomplicated: Secondary | ICD-10-CM | POA: Insufficient documentation

## 2023-04-10 DIAGNOSIS — J449 Chronic obstructive pulmonary disease, unspecified: Secondary | ICD-10-CM | POA: Insufficient documentation

## 2023-04-10 DIAGNOSIS — S82001A Unspecified fracture of right patella, initial encounter for closed fracture: Secondary | ICD-10-CM | POA: Diagnosis not present

## 2023-04-10 DIAGNOSIS — M79662 Pain in left lower leg: Secondary | ICD-10-CM | POA: Diagnosis not present

## 2023-04-10 DIAGNOSIS — S82021A Displaced longitudinal fracture of right patella, initial encounter for closed fracture: Secondary | ICD-10-CM | POA: Insufficient documentation

## 2023-04-10 DIAGNOSIS — Z7982 Long term (current) use of aspirin: Secondary | ICD-10-CM | POA: Insufficient documentation

## 2023-04-10 DIAGNOSIS — S82001D Unspecified fracture of right patella, subsequent encounter for closed fracture with routine healing: Secondary | ICD-10-CM | POA: Diagnosis not present

## 2023-04-10 DIAGNOSIS — S8991XA Unspecified injury of right lower leg, initial encounter: Secondary | ICD-10-CM | POA: Diagnosis present

## 2023-04-10 DIAGNOSIS — M25561 Pain in right knee: Secondary | ICD-10-CM | POA: Diagnosis not present

## 2023-04-10 DIAGNOSIS — S82091D Other fracture of right patella, subsequent encounter for closed fracture with routine healing: Secondary | ICD-10-CM

## 2023-04-10 DIAGNOSIS — M1711 Unilateral primary osteoarthritis, right knee: Secondary | ICD-10-CM | POA: Diagnosis not present

## 2023-04-10 MED ORDER — ONDANSETRON 4 MG PO TBDP
4.0000 mg | ORAL_TABLET | Freq: Once | ORAL | Status: AC
  Administered 2023-04-10: 4 mg via ORAL
  Filled 2023-04-10: qty 1

## 2023-04-10 MED ORDER — HYDROCODONE-ACETAMINOPHEN 5-325 MG PO TABS
1.0000 | ORAL_TABLET | Freq: Once | ORAL | Status: AC
  Administered 2023-04-10: 1 via ORAL
  Filled 2023-04-10: qty 1

## 2023-04-10 NOTE — ED Triage Notes (Signed)
Pt reports fall on Tuesday down 6-8 steps, pt had xrays done, she has a fx of the right patella and possibly the femur (waiting for MRI on Monday for certain). Went to [facility] today, who sent pt here with concerns for blood clot in the leg, d/t swelling and pain in the right leg.

## 2023-04-10 NOTE — ED Provider Triage Note (Signed)
Emergency Medicine Provider Triage Evaluation Note  JINCY KONICEK , a 67 y.o. female  was evaluated in triage.  Pt complains of right leg pain after fall on Tuesday.  Sent her from [facility] for possible blood clot.  Also ? Distal femur fracture.  Has known patellar fx.  Review of Systems  Positive: Leg pain  Negative: Numbness/weakness  Physical Exam  BP (!) 163/87 (BP Location: Left Arm)   Pulse 65   Temp 97.6 F (36.4 C) (Oral)   Resp 18   Ht 5\' 6"  (1.676 m)   Wt 102.1 kg   SpO2 100%   BMI 36.32 kg/m  Gen:   Awake, no distress    Resp:  Normal effort   MSK:   Moves extremities without difficulty   Other:     Medical Decision Making  Medically screening exam initiated at 10:45 PM.  Appropriate orders placed.  DARCEL STOKLEY was informed that the remainder of the evaluation will be completed by another provider, this initial triage assessment does not replace that evaluation, and the importance of remaining in the ED until their evaluation is complete.      Rolan Bucco, MD 04/10/23 [phone]

## 2023-04-11 ENCOUNTER — Emergency Department (HOSPITAL_BASED_OUTPATIENT_CLINIC_OR_DEPARTMENT_OTHER): Payer: PPO

## 2023-04-11 ENCOUNTER — Encounter (HOSPITAL_COMMUNITY): Payer: Self-pay

## 2023-04-11 DIAGNOSIS — M7989 Other specified soft tissue disorders: Secondary | ICD-10-CM | POA: Diagnosis not present

## 2023-04-11 MED ORDER — HYDROCODONE-ACETAMINOPHEN 5-325 MG PO TABS
1.0000 | ORAL_TABLET | Freq: Once | ORAL | Status: AC
  Administered 2023-04-11: 1 via ORAL
  Filled 2023-04-11: qty 1

## 2023-04-11 MED ORDER — OXYCODONE-ACETAMINOPHEN 5-325 MG PO TABS
1.0000 | ORAL_TABLET | ORAL | 0 refills | Status: DC | PRN
Start: 2023-04-11 — End: 2023-09-17

## 2023-04-11 NOTE — Progress Notes (Signed)
Right lower extremity venous duplex has been completed. Preliminary results can be found in CV Proc through chart review.  Results were given to Dr. Freida Busman.  04/11/23 9:12 AM Olen Cordial RVT

## 2023-04-11 NOTE — Discharge Instructions (Addendum)
Apply ice for 30 minutes at a time, 4 times a day.  Keep your leg elevated is much as possible.  Continue to wear the immobilizer until orthopedic physician states that it is not necessary.

## 2023-04-11 NOTE — ED Notes (Signed)
Patient is holding for [place] of Lower leg till morning. Was scheduled for today anyway but was instructed to come to ED for evaluation. Patient will receive the [place] from ED in morning and go from there for treatment.

## 2023-04-11 NOTE — ED Provider Notes (Signed)
[place] [facility]ERGENCY DEPARTMENT AT [facility] Provider Note   CSN: [id number] Arrival date & time: 04/10/23  2137     History {Add pertinent medical, surgical, social history, OB history to HPI:1} Chief Complaint  Patient presents with   Fall   Leg Swelling    [name] is a 67 y.o. female.  The history is provided by the patient.  Fall  She has history of asthma/COPD was sent here by her orthopedic office because of concern for possible blood clot in her leg as well as also possible distal femur fracture.  She had fallen down about 4 steps about 3 days ago and injured her right knee.  X-rays were obtained following the fall showing a nondisplaced patella fracture but also some question of impacted fracture of the distal femur.  She went to an orthopedic office today where she had additional x-rays.  She was placed in a knee immobilizer and told to come here to rule out DVT.  She is complaining about a lot of pain and swelling in her right knee.   Home Medications Prior to Admission medications   Medication Sig Start Date End Date Taking? Authorizing Provider  aspirin 81 MG EC tablet Take 81 mg by mouth every other day.    [provider]  B Complex-C (SUPER B COMPLEX PO) Take 1 tablet by mouth daily.    [provider]  cetirizine (ZYRTEC ALLERGY) 10 MG tablet Take 1 tablet (10 mg total) by mouth daily. 12/07/19   Lorin Glass, MD  Cholecalciferol (VITAMIN D3) 25 MCG (1000 UT) CAPS Take 1 capsule by mouth daily.    [provider]  Cranberry 1000 MG CAPS Take 1 capsule by mouth daily.    [provider]  estradiol (CLIMARA) 0.0375 mg/24hr patch Place 1 patch (0.0375 mg total) onto the skin once a week. 01/09/23   Anyanwu, Jethro Bastos, MD  fluconazole (DIFLUCAN) 100 MG tablet TAKE (1) TABLET BY MOUTH ONCE DAILY FOR (7) DAYS. 02/14/22   Chilton Greathouse, MD  Garlic 1000 MG CAPS Take 1 capsule by mouth daily.    [provider]  magic mouthwash (nystatin, lidocaine, diphenhydrAMINE) suspension Take 5 mLs by mouth 3 (three) times daily as needed for mouth pain. 02/26/22   Omar Person, MD  Multiple Vitamins-Minerals (MULTIVITAMIN ADULTS PO) Take 1 tablet by mouth daily.    [provider]  nystatin (MYCOSTATIN) 100000 UNIT/ML suspension Take 5 mLs (500,000 Units total) by mouth 4 (four) times daily. Swish and spit, retain in mouth for as long as tolerated 02/11/22   Mannam, Praveen, MD  Omega-3 Fatty Acids (FISH OIL) 1000 MG CAPS Take 1 capsule by mouth daily.    [provider]  umeclidinium-vilanterol (ANORO ELLIPTA) 62.5-25 MCG/ACT AEPB INHALE 1 PUFF INTO THE LUNGS ONCE DAILY. 11/19/22   Chilton Greathouse, MD      Allergies    Bee venom, Codeine, Other, and Propofol    Review of Systems   Review of Systems  All other systems reviewed and are negative.   Physical Exam Updated Vital Signs BP (!) 145/84   Pulse 63   Temp 97.8 F (36.6 C) (Oral)   Resp 18   Ht 5\' 6"  (1.676 m)   Wt 102.1 kg   SpO2 95%   BMI 36.32 kg/m  Physical Exam Vitals and nursing note reviewed.   67 year old female, resting comfortably and in no acute distress. Vital signs are significant for  mildly elevated blood pressure. Oxygen saturation is 95%, which is normal. Head is normocephalic and atraumatic. PERRLA, EOMI. Oropharynx is clear. Neck is nontender. Back is nontender and there is no CVA tenderness. Lungs are clear without rales, wheezes, or rhonchi. Chest is nontender. Heart has regular rate and rhythm without murmur. Abdomen is soft, flat, nontender. Extremities: There is marked swelling over the anterior aspect of the right knee with moderate to large right knee effusion present.  There is marked tenderness over the same area.  There is mild swelling of the right calf without significant calf tenderness.  Remainder of extremity exam is normal. Skin is warm and dry without rash. Neurologic: Mental  status is normal, cranial nerves are intact, moves all uninjured extremities equally.  ED Results / Procedures / Treatments   Labs (all labs ordered are listed, but only abnormal results are displayed) Labs Reviewed - No data to display  EKG None  Radiology CT Knee Right Wo Contrast  Result Date: 04/11/2023 CLINICAL DATA:  Fall, right knee pain, patellar fracture EXAM: CT OF THE RIGHT KNEE WITHOUT CONTRAST TECHNIQUE: Multidetector CT imaging of the right knee was performed according to the standard protocol. Multiplanar CT image reconstructions were also generated. RADIATION DOSE REDUCTION: This exam was performed according to the departmental dose-optimization program which includes automated exposure control, adjustment of the mA and/or kV according to patient size and/or use of iterative reconstruction technique. COMPARISON:  None Available. FINDINGS: Bones/Joint/Cartilage There is an acute, mildly comminuted, longitudinally oriented fracture within the sagittal plane involving the mid body of the patella demonstrating minimal displacement and near anatomic alignment. There is superimposed lateral subluxation of the patella. The visualized distal femur, proximal tibia, and proximal fibula are intact. Medial and lateral compartment joint spaces are preserved. No lytic or blastic bone lesion. Ligaments Suboptimally assessed by CT. Muscles and Tendons Quadriceps and patellar tendons are intact.  Normal muscle bulk. Soft tissues Moderate lipohemarthrosis. Extensive subcutaneous edema within the surrounding soft tissues, particularly within the prepatellar region where punctate foci of subcutaneous gas are identified suggesting superficial laceration in this location. IMPRESSION: 1. Acute, mildly comminuted, longitudinally oriented fracture within the sagittal plane involving the mid body of the patella demonstrating minimal displacement and near anatomic alignment. 2. Superimposed lateral subluxation of  the patella. 3. Moderate lipohemarthrosis. 4. Extensive subcutaneous edema within the surrounding soft tissues, particularly within the prepatellar region where punctate foci of subcutaneous gas are identified suggesting superficial laceration in this location. Electronically Signed   By: Helyn Numbers M.D.   On: 04/11/2023 00:25   DG Tibia/Fibula Right  Result Date: 04/11/2023 CLINICAL DATA:  Right knee trauma. Fracture suspected. CT also obtained. EXAM: RIGHT TIBIA AND FIBULA - 2 VIEW COMPARISON:  None Available. FINDINGS: There is a longitudinal oblique nondisplaced fracture involving the lateral 1/3 of the patella, visible only on the first image. There is no evidence of right tibial or fibular fracture or other focal bone lesions. Moderate-sized suprapatellar bursal effusion is noted on the lateral view. There is mild tricompartmental nonerosive arthrosis of the knee, early degenerative spurring at the ankle. There is stranding edema in the mid to distal foreleg extending circumferentially over the ankle. IMPRESSION: 1. Longitudinal oblique nondisplaced fracture of the lateral 1/3 of the patella, visible only on the first image. 2. Moderate-sized suprapatellar bursal effusion.  Mild DJD. 3. Soft tissue swelling in the mid to distal foreleg and ankle. 4. No fracture is seen of the right tibia or fibula. Electronically Signed  By: Almira Bar M.D.   On: 04/11/2023 00:22    Procedures Procedures  {Document cardiac monitor, telemetry assessment procedure when appropriate:1}  Medications Ordered in ED Medications  HYDROcodone-acetaminophen (NORCO/VICODIN) 5-325 MG per tablet 1 tablet (has no administration in time range)  ondansetron (ZOFRAN-ODT) disintegrating tablet 4 mg (4 mg Oral Given 04/10/23 2254)  HYDROcodone-acetaminophen (NORCO/VICODIN) 5-325 MG per tablet 1 tablet (1 tablet Oral Given 04/10/23 2254)    ED Course/ Medical Decision Making/ A&P   {   Click here for ABCD2, HEART and  other calculatorsREFRESH Note before signing :1}                              Medical Decision Making Risk Prescription drug management.   Fall with injury to right knee with known patellar fracture.  Clinically, doubt DVT but I cannot rule that out without venous Doppler.  I have reviewed her past records and note x-ray of right knee on 04/08/2023 showing nondisplaced fracture of the patella with focal cavity of the anterior femoral condyle which could reflect an impaction fracture of the lateral femoral condyle.  X-rays of the right tibia/fibula obtained here showed an oblique nondisplaced fracture involving the lateral third of the patella with moderate suprapatellar effusion.  CT of the right knee again showed acute, mildly comminuted, longitudinally oriented fracture involving the mid body of the patella but no evidence of fracture involving the femoral condyle.  I have independently viewed all of these images, and agree with the radiologist's interpretation.  I have ordered venous Doppler to rule out DVT.  {Document critical care time when appropriate:1} {Document review of labs and clinical decision tools ie heart score, Chads2Vasc2 etc:1}  {Document your independent review of radiology images, and any outside records:1} {Document your discussion with family members, caretakers, and with consultants:1} {Document social determinants of health affecting pt's care:1} {Document your decision making why or why not admission, treatments were needed:1} Final Clinical Impression(s) / ED Diagnoses Final diagnoses:  None    Rx / DC Orders ED Discharge Orders     None

## 2023-04-11 NOTE — ED Notes (Signed)
Requested additional pain med for pt reported 9/10 pain and social worker consult per pt request for home health/assistance while recovering from broken knee

## 2023-04-11 NOTE — ED Provider Notes (Signed)
Patient signed out to me by Dr. Preston Fleeting pending results of ultrasound of lower extremity which is negative for DVT.  Will add consult for home health aide.   Lorre Nick, MD 04/11/23 [phone]

## 2023-04-11 NOTE — ED Notes (Signed)
Pt requesting food, ok per Dr Freida Busman MD

## 2023-04-13 DIAGNOSIS — M25561 Pain in right knee: Secondary | ICD-10-CM | POA: Diagnosis not present

## 2023-04-13 DIAGNOSIS — S82024A Nondisplaced longitudinal fracture of right patella, initial encounter for closed fracture: Secondary | ICD-10-CM | POA: Diagnosis not present

## 2023-04-16 ENCOUNTER — Institutional Professional Consult (permissible substitution): Payer: PPO | Admitting: Plastic Surgery

## 2023-04-20 DIAGNOSIS — S82001D Unspecified fracture of right patella, subsequent encounter for closed fracture with routine healing: Secondary | ICD-10-CM | POA: Diagnosis not present

## 2023-04-20 DIAGNOSIS — I10 Essential (primary) hypertension: Secondary | ICD-10-CM | POA: Diagnosis not present

## 2023-04-20 DIAGNOSIS — U071 COVID-19: Secondary | ICD-10-CM | POA: Diagnosis not present

## 2023-04-21 ENCOUNTER — Encounter: Payer: Self-pay | Admitting: Pulmonary Disease

## 2023-05-03 ENCOUNTER — Other Ambulatory Visit: Payer: Self-pay | Admitting: Pulmonary Disease

## 2023-05-04 DIAGNOSIS — S82091D Other fracture of right patella, subsequent encounter for closed fracture with routine healing: Secondary | ICD-10-CM | POA: Diagnosis not present

## 2023-05-08 DIAGNOSIS — S82001D Unspecified fracture of right patella, subsequent encounter for closed fracture with routine healing: Secondary | ICD-10-CM | POA: Diagnosis not present

## 2023-06-02 DIAGNOSIS — S82091D Other fracture of right patella, subsequent encounter for closed fracture with routine healing: Secondary | ICD-10-CM | POA: Diagnosis not present

## 2023-06-25 ENCOUNTER — Institutional Professional Consult (permissible substitution): Payer: PPO | Admitting: Plastic Surgery

## 2023-09-14 DIAGNOSIS — B372 Candidiasis of skin and nail: Secondary | ICD-10-CM | POA: Diagnosis not present

## 2023-09-14 DIAGNOSIS — R300 Dysuria: Secondary | ICD-10-CM | POA: Diagnosis not present

## 2023-09-14 DIAGNOSIS — B3731 Acute candidiasis of vulva and vagina: Secondary | ICD-10-CM | POA: Diagnosis not present

## 2023-09-14 DIAGNOSIS — E782 Mixed hyperlipidemia: Secondary | ICD-10-CM | POA: Diagnosis not present

## 2023-09-14 DIAGNOSIS — Z5181 Encounter for therapeutic drug level monitoring: Secondary | ICD-10-CM | POA: Diagnosis not present

## 2023-09-14 DIAGNOSIS — Z1159 Encounter for screening for other viral diseases: Secondary | ICD-10-CM | POA: Diagnosis not present

## 2023-09-14 DIAGNOSIS — R739 Hyperglycemia, unspecified: Secondary | ICD-10-CM | POA: Diagnosis not present

## 2023-09-14 DIAGNOSIS — R3129 Other microscopic hematuria: Secondary | ICD-10-CM | POA: Diagnosis not present

## 2023-09-17 ENCOUNTER — Inpatient Hospital Stay: Payer: PPO | Attending: Hematology

## 2023-09-17 ENCOUNTER — Inpatient Hospital Stay: Payer: PPO | Admitting: Oncology

## 2023-09-17 VITALS — BP 101/54 | HR 75 | Temp 97.9°F | Wt 219.8 lb

## 2023-09-17 DIAGNOSIS — D72829 Elevated white blood cell count, unspecified: Secondary | ICD-10-CM

## 2023-09-17 DIAGNOSIS — Z87891 Personal history of nicotine dependence: Secondary | ICD-10-CM | POA: Insufficient documentation

## 2023-09-17 DIAGNOSIS — D721 Eosinophilia, unspecified: Secondary | ICD-10-CM | POA: Insufficient documentation

## 2023-09-17 DIAGNOSIS — Z800 Family history of malignant neoplasm of digestive organs: Secondary | ICD-10-CM | POA: Diagnosis not present

## 2023-09-17 DIAGNOSIS — L304 Erythema intertrigo: Secondary | ICD-10-CM

## 2023-09-17 DIAGNOSIS — Z806 Family history of leukemia: Secondary | ICD-10-CM | POA: Diagnosis not present

## 2023-09-17 DIAGNOSIS — Z803 Family history of malignant neoplasm of breast: Secondary | ICD-10-CM | POA: Insufficient documentation

## 2023-09-17 DIAGNOSIS — Z808 Family history of malignant neoplasm of other organs or systems: Secondary | ICD-10-CM | POA: Insufficient documentation

## 2023-09-17 LAB — CBC WITH DIFFERENTIAL/PLATELET
Abs Immature Granulocytes: 0.02 10*3/uL (ref 0.00–0.07)
Basophils Absolute: 0.1 10*3/uL (ref 0.0–0.1)
Basophils Relative: 1 %
Eosinophils Absolute: 0.3 10*3/uL (ref 0.0–0.5)
Eosinophils Relative: 4 %
HCT: 39.5 % (ref 36.0–46.0)
Hemoglobin: 13 g/dL (ref 12.0–15.0)
Immature Granulocytes: 0 %
Lymphocytes Relative: 42 %
Lymphs Abs: 4.2 10*3/uL — ABNORMAL HIGH (ref 0.7–4.0)
MCH: 30.2 pg (ref 26.0–34.0)
MCHC: 32.9 g/dL (ref 30.0–36.0)
MCV: 91.6 fL (ref 80.0–100.0)
Monocytes Absolute: 0.7 10*3/uL (ref 0.1–1.0)
Monocytes Relative: 7 %
Neutro Abs: 4.6 10*3/uL (ref 1.7–7.7)
Neutrophils Relative %: 46 %
Platelets: 274 10*3/uL (ref 150–400)
RBC: 4.31 MIL/uL (ref 3.87–5.11)
RDW: 12.8 % (ref 11.5–15.5)
WBC: 9.8 10*3/uL (ref 4.0–10.5)
nRBC: 0 % (ref 0.0–0.2)

## 2023-09-17 LAB — LACTATE DEHYDROGENASE: LDH: 159 U/L (ref 98–192)

## 2023-09-17 MED ORDER — FLUCONAZOLE 100 MG PO TABS
100.0000 mg | ORAL_TABLET | Freq: Every day | ORAL | 0 refills | Status: DC
Start: 2023-09-17 — End: 2024-03-03

## 2023-09-17 MED ORDER — NYSTATIN 100000 UNIT/GM EX POWD: 1.0000 | Freq: Three times a day (TID) | CUTANEOUS | 0 refills | Status: DC

## 2023-09-17 NOTE — Progress Notes (Addendum)
 [facility] [address], [place] [address]    Clinic Day:  09/17/2023  Referring physician: Elfredia Nevins, MD  Patient Care Team: Pcp, No as PCP - General Doreatha Massed, MD as Consulting Physician (Hematology)   ASSESSMENT & PLAN:   Assessment: 1.  Leukocytosis: -Patient seen at the request of Dr. Sherwood Gambler as CBC on 10/24/2019 showed white count 14.5 with normal hemoglobin and platelets. -She reported that several of her prior CBCs have shown elevated white counts. -Labs on 12/13/2019 shows normal white count 9.5.  Mild eosinophilia. -JAK2 V617F and reflex testing, BCR/ABL was negative.  Flow cytometry was negative.   2.  Family history: -Paternal grand father had and paternal aunt had "bone cancer". -Maternal grandfather had leukemia.  Mother had breast cancer but died of acute myeloid leukemia.  Maternal aunt had metastatic cancer.  Another maternal aunt and maternal cousin died of metastatic breast cancer.   Plan: 1.  JAK2 V617F and BCR/ABL negative leukocytosis: - Labs from 09/17/2023 show white blood cell count of 9.8 with ALC of 4.2.  The rest of the differential is normal.  LDH is normal at 159. -No recurrent infections. - She does not report any B symptoms.  - Physical examination did not reveal any palpable adenopathy or splenomegaly. - She reportedly quit smoking in January 2023.  No indication for further testing. - Recommend follow-up in 1 year with repeat CBC and LDH.  2.  Intertriginous dermatitis: -Reports this is an on-and-off problem. -Has been on Diflucan intermittently over the past month. -Has a cream that she uses intermittently.  We discussed consistently using cream and Diflucan as prescribed but could also try nystatin powder.  Try using a barrier so that skin does not rub together.    No orders of the defined types were placed in this encounter.    Mauro Kaufmann, NP   2/27/20252:53 PM  CHIEF COMPLAINT:    Diagnosis:  leukocytosis    Cancer Staging  No matching staging information was found for the patient.    Prior Therapy: None  Current Therapy:   Observation.     HISTORY OF PRESENT ILLNESS:   Oncology History   No history exists.     INTERVAL HISTORY:   Jozalynn is a 68 y.o. female seen for follow-up of leukocytosis.  She was last seen in clinic on 09/16/2022.  In the interim, she was evaluated in the emergency room for a fall and right leg swelling.  Reports she fell and injured right knee.  Imaging revealed nondisplaced patella fracture with some question of impacted fracture of the distal femur.  She was placed in a knee immobilizer and instructed to go to the ED to rule out a DVT.  Doppler of right lower extremity was ordered and negative per notes but unfortunately I am unable to see imaging in epic.  Reports she was mostly immobile for about 8 weeks after her fall.  She started to get up and move around more.  She has recently sold her old home and moved into a camper.  She is waiting on her mobile home to be ready which she is hoping is in the next couple of weeks.  Reports persistent yeast infection under her breast and in her groin.  She has been on and off Diflucan for several weeks now.  Reports is very itchy.  No recent antibiotics.  Denies any fevers, night sweats or weight loss in the last 1 year.  Denies  any recent infections.  Reports her anxiety level is low now that she was able to pay off all of her debt with the sale of her house and buy a new one.  States she is very excited to move into her new home.   PAST MEDICAL HISTORY:   Past Medical History: Past Medical History:  Diagnosis Date   Anxiety    Asthma    Diverticulosis    Hypercholesteremia    Pneumonia 12/2018   double pneumonia   PONV (postoperative nausea and vomiting)    Seasonal allergies    Shingles    Vaginal Pap smear, abnormal    Varicose veins     Surgical History: Past Surgical  History:  Procedure Laterality Date   ABDOMINAL HYSTERECTOMY     partial   BACK SURGERY     cervical surgery   COLONOSCOPY N/A 02/04/2017   Procedure: COLONOSCOPY;  Surgeon: Malissa Hippo, MD;  [facility]: [facility];  Service: Endoscopy;  Laterality: N/A;  930   ENDOSCOPIC CONCHA BULLOSA RESECTION Bilateral 03/21/2019   Procedure: ENDOSCOPIC CONCHA BULLOSA RESECTION;  Surgeon: Newman Pies, MD;  Location: [facility];  Service: ENT;  Laterality: Bilateral;   NASAL SEPTOPLASTY W/ TURBINOPLASTY Bilateral 03/21/2019   Procedure: NASAL SEPTOPLASTY WITH BILATERAL TURBINATE REDUCTION;  Surgeon: Newman Pies, MD;  Location: [facility];  Service: ENT;  Laterality: Bilateral;   TONSILLECTOMY     WRIST SURGERY     age 48yrs    Social History: Social History   Socioeconomic History   Marital status: Widowed    Spouse name: Not on file   Number of children: 3   Years of education: Not on file   Highest education level: Not on file  Occupational History   Not on file  Tobacco Use   Smoking status: Former    Current packs/day: 0.00    Average packs/day: 0.5 packs/day for 45.0 years (22.5 ttl pk-yrs)    Types: Cigarettes    Start date: 07/20/1976    Quit date: 07/20/2021    Years since quitting: 2.1   Smokeless tobacco: Never  Vaping Use   Vaping status: Former  Substance and Sexual Activity   Alcohol use: Yes    Alcohol/week: 0.0 standard drinks of alcohol    Comment: occ   Drug use: No   Sexual activity: Not Currently    Birth control/protection: Post-menopausal  Other Topics Concern   Not on file  Social History Narrative   Not on file   Social Drivers of Health   Financial Resource Strain: Low Risk  (06/27/2020)   Overall Financial Resource Strain (CARDIA)    Difficulty of Paying Living Expenses: Not hard at all  Food Insecurity: Low Risk  (09/14/2023)   Received from [facility]   Hunger Vital Sign    Worried About Running Out of Food in the Last  Year: Never true    Ran Out of Food in the Last Year: Never true  Transportation Needs: No Transportation Needs (09/14/2023)   Received from [facility]    In the past 12 months, has lack of reliable transportation kept you from medical appointments, meetings, work or from getting things needed for daily living? : No  Physical Activity: Inactive (06/27/2020)   Exercise Vital Sign    Days of Exercise per Week: 0 days    Minutes of Exercise per Session: 0 min  Stress: No Stress Concern Present (06/27/2020)   [employer]  of Occupational Health - Occupational Stress Questionnaire    Feeling of Stress : Not at all  Social Connections: Moderately Integrated (06/27/2020)   Social Connection and Isolation Panel [NHANES]    Frequency of Communication with Friends and Family: More than three times a week    Frequency of Social Gatherings with Friends and Family: Three times a week    Attends Religious Services: 1 to 4 times per year    Active Member of Clubs or Organizations: No    Attends Banker Meetings: Never    Marital Status: Married  Catering manager Violence: Not At Risk (06/27/2020)   Humiliation, Afraid, Rape, and Kick questionnaire    Fear of Current or Ex-Partner: No    Emotionally Abused: No    Physically Abused: No    Sexually Abused: No    Family History: Family History  Problem Relation Age of Onset   Breast cancer Mother    Cancer Mother        breast   Heart attack Mother    Acute myelogenous leukemia Mother    Cancer Father    Gallbladder disease Father    Cancer Brother    Diabetes Brother    Skin cancer Brother    Diabetes Brother    Cancer Maternal Aunt    Thyroid cancer Maternal Aunt    Brain cancer Maternal Aunt    Cancer Maternal Aunt    Breast cancer Maternal Aunt    Lung cancer Maternal Aunt    Cancer Paternal Aunt    Bone cancer Paternal Aunt    Diabetes Maternal Grandmother    Dementia Maternal Grandmother     Cancer Maternal Grandfather    Leukemia Maternal Grandfather    Cancer Paternal Grandmother    Bone cancer Paternal Grandmother    Breast cancer Cousin    Colon cancer Neg Hx     Current Medications:  Current Outpatient Medications:    fluconazole (DIFLUCAN) 100 MG tablet, Take 1 tablet (100 mg total) by mouth daily., Disp: 10 tablet, Rfl: 0   fluconazole (DIFLUCAN) 150 MG tablet, Take 150 mg by mouth every 3 (three) days., Disp: , Rfl:    nystatin (MYCOSTATIN/NYSTOP) powder, Apply 1 Application topically 3 (three) times daily., Disp: 15 g, Rfl: 0   aspirin 81 MG EC tablet, Take 81 mg by mouth every other day., Disp: , Rfl:    B Complex-C (SUPER B COMPLEX PO), Take 1 tablet by mouth daily., Disp: , Rfl:    cetirizine (ZYRTEC ALLERGY) 10 MG tablet, Take 1 tablet (10 mg total) by mouth daily., Disp: 90 tablet, Rfl: 3   Cholecalciferol (VITAMIN D3) 25 MCG (1000 UT) CAPS, Take 1 capsule by mouth daily., Disp: , Rfl:    Cranberry 1000 MG CAPS, Take 1 capsule by mouth daily., Disp: , Rfl:    estradiol (CLIMARA) 0.0375 mg/24hr patch, Place 1 patch (0.0375 mg total) onto the skin once a week., Disp: 4 patch, Rfl: 2   Garlic 1000 MG CAPS, Take 1 capsule by mouth daily., Disp: , Rfl:    Multiple Vitamins-Minerals (MULTIVITAMIN ADULTS PO), Take 1 tablet by mouth daily., Disp: , Rfl:    Omega-3 Fatty Acids (FISH OIL) 1000 MG CAPS, Take 1 capsule by mouth daily., Disp: , Rfl:    umeclidinium-vilanterol (ANORO ELLIPTA) 62.5-25 MCG/ACT AEPB, INHALE 1 PUFF INTO THE LUNGS ONCE DAILY., Disp: 60 each, Rfl: 11   Allergies: Allergies  Allergen Reactions   Bee Venom Anaphylaxis   Codeine Nausea  And Vomiting   Other Nausea And Vomiting    General anesthesia    Propofol Nausea And Vomiting    REVIEW OF SYSTEMS:   Review of Systems  Genitourinary:         Yeast infection under breast and groin  Musculoskeletal:  Positive for back pain.  Skin:  Positive for itching and rash.  Neurological:  Negative  for dizziness and headaches.  Psychiatric/Behavioral:  Negative for depression. The patient is not nervous/anxious.      VITALS:   Blood pressure (!) 101/54, pulse 75, temperature 97.9 F (36.6 C), temperature source Tympanic, weight 219 lb 12.8 oz (99.7 kg), SpO2 100%.  Wt Readings from Last 3 Encounters:  09/17/23 219 lb 12.8 oz (99.7 kg)  04/10/23 225 lb (102.1 kg)  02/20/23 222 lb (100.7 kg)    Body mass index is 35.48 kg/m.  Performance status (ECOG): 1 - Symptomatic but completely ambulatory  PHYSICAL EXAM:   Physical Exam Constitutional:      Appearance: Normal appearance.  Cardiovascular:     Rate and Rhythm: Normal rate and regular rhythm.  Pulmonary:     Effort: Pulmonary effort is normal.     Breath sounds: Normal breath sounds.  Chest:     Comments: Excoriation underneath bilateral breasts and to bilateral groin folds. Abdominal:     General: Bowel sounds are normal.     Palpations: Abdomen is soft.  Musculoskeletal:        General: No swelling. Normal range of motion.  Neurological:     Mental Status: She is alert and oriented to person, place, and time. Mental status is at baseline.     LABS:      Latest Ref Rng & Units 09/17/2023    1:25 PM 12/26/2022   10:11 AM 07/26/2021    6:18 AM  CBC  WBC 4.0 - 10.5 K/uL 9.8  7.2  14.0   Hemoglobin 12.0 - 15.0 g/dL 40.9  81.1  91.4   Hematocrit 36.0 - 46.0 % 39.5  39.4  31.4   Platelets 150 - 400 K/uL 274  264  325       Latest Ref Rng & Units 12/26/2022   10:11 AM 07/25/2021    4:53 AM 07/24/2021    5:39 AM  CMP  Glucose 70 - 99 mg/dL 96  782  956   BUN 8 - 27 mg/dL 15  20  23    Creatinine 0.57 - 1.00 mg/dL 2.13  0.86  5.78   Sodium 134 - 144 mmol/L 142  134  131   Potassium 3.5 - 5.2 mmol/L 4.7  4.0  3.3   Chloride 96 - 106 mmol/L 105  103  101   CO2 20 - 29 mmol/L 25  24  22    Calcium 8.7 - 10.3 mg/dL 9.2  8.2  7.7   Total Protein 6.0 - 8.5 g/dL 6.6     Total Bilirubin 0.0 - 1.2 mg/dL 0.3     Alkaline  Phos 44 - 121 IU/L 85     AST 0 - 40 IU/L 26     ALT 0 - 32 IU/L 25        No results found for: "CEA1", "CEA" / No results found for: "CEA1", "CEA" No results found for: "PSA1" No results found for: "ION629" No results found for: "CAN125"  No results found for: "TOTALPROTELP", "ALBUMINELP", "A1GS", "A2GS", "BETS", "BETA2SER", "GAMS", "MSPIKE", "SPEI" Lab Results  Component Value Date   TIBC 201 (L)  07/23/2021   FERRITIN 265 07/23/2021   IRONPCTSAT 5 (L) 07/23/2021   Lab Results  Component Value Date   LDH 159 09/17/2023   LDH 168 06/20/2020   LDH 172 12/13/2019     STUDIES:   No results found.

## 2023-09-17 NOTE — Addendum Note (Signed)
 Addended by: Durenda Hurt E on: 09/17/2023 03:01 PM   Modules accepted: Orders

## 2023-10-16 DIAGNOSIS — N289 Disorder of kidney and ureter, unspecified: Secondary | ICD-10-CM | POA: Diagnosis not present

## 2023-10-28 DIAGNOSIS — J301 Allergic rhinitis due to pollen: Secondary | ICD-10-CM | POA: Diagnosis not present

## 2023-10-28 DIAGNOSIS — H6591 Unspecified nonsuppurative otitis media, right ear: Secondary | ICD-10-CM | POA: Diagnosis not present

## 2023-12-29 ENCOUNTER — Ambulatory Visit: Admitting: Pulmonary Disease

## 2023-12-29 DIAGNOSIS — J4489 Other specified chronic obstructive pulmonary disease: Secondary | ICD-10-CM

## 2023-12-29 NOTE — Progress Notes (Signed)
 [name]    [id number]    01/24/1956  Primary Care Physician:Pcp, No  Referring Physician: No referring provider defined for this encounter.    Chief complaint: Follow-up for asthma, COPD  HPI: 68 y.o.  ex smoker with COPD, asthma, seasonal allergies Developed dyspnea, cough, hypoxia after receiving the first dose of Moderna vaccine in February.  Treated with supplemental oxygen  and steroids.  Reports pneumonia x2 last year and thinks she may had Covid infection at that time.  Seen by my partner Dr. Felipe Horton around May 2021 and diagnosed with asthma, possible allergic reaction to the vaccine.   Evaluated by hematology in May 2021 for leukocytosis with no evidence of myeloproliferative disorder  She was hospitalized in early December 2022 for community-acquired pneumonia with right upper lobe infiltrate on chest x-ray treated with empiric IV antibiotics and fluids.  She was briefly on supplemental oxygen  but did not require oxygen  to go home. Overall she is slowly improving but continues to have weakness and fatigue.  She finally quit smoking for New Year's 2023 resolution  Has recurrent episodes of bronchitis, asthma exacerbations since November 2022.  She has been treated with Z-Pak, amoxicillin  and 2 rounds of prednisone  with minimal improvement.  She was prescribed Singulair  but elected not to take it after she read the possible side effects  P[place]: [place] and dogs Occupation: Visual merchandiser Exposures: No known exposures.  No mold, hot tub, Jacuzzi Smoking history: 23-pack-year smoker.  Quit smoking in December 2022 Travel history: No significant travel history Relevant family history: No significant family issue of lung disease.  Interim History: Discussed the use of AI scribe software for clinical note transcription with the patient, who gave verbal consent to proceed.  History of Present Illness DARSI [name] is a 68 year old female  with COPD who presents for a routine pulmonary follow-up.  She continues to use anoro inhaler every night with good control of her asthma symptoms. No recent asthma exacerbations have occurred, and she has discarded her expired albuterol  inhaler. She uses antihistamines, Flonase nasal spray, and eye drops for dry eyes, which effectively manage her symptoms.  Her blood pressure was slightly elevated, which she attributes to the stress of moving from a larger house to a smaller one. She is currently unpacking boxes and adjusting to the new space.  In September of the previous year, she experienced a fall resulting in a vertically split kneecap. She was placed in a cast and was immobile for eight weeks, which was challenging for her. This incident led her to decide to move to a smaller house.  She was started on pravastatin, which she reports is causing significant dehydration, leading to dry skin and brittle nails. She is increasing her fluid intake, drinking water  and tea, and taking biotin and multivitamins to manage these side effects.  She no longer has horses, as she gave them up following her fall and the passing of her oldest horse. She now has two dogs.    Outpatient Encounter Medications as of 12/29/2023  Medication Sig   aspirin  81 MG EC tablet Take 81 mg by mouth every other day.   B Complex-C (SUPER B COMPLEX PO) Take 1 tablet by mouth daily.   Biotin 1000 MCG CHEW Chew by mouth.   cetirizine  (ZYRTEC  ALLERGY) 10 MG tablet Take 1 tablet (10 mg total) by mouth daily.   Cholecalciferol (VITAMIN D3) 25 MCG (1000 UT)  CAPS Take 1 capsule by mouth daily.   Cranberry 1000 MG CAPS Take 1 capsule by mouth daily.   estradiol  (CLIMARA ) 0.0375 mg/24hr patch Place 1 patch (0.0375 mg total) onto the skin once a week.   Garlic  1000 MG CAPS Take 1 capsule by mouth daily.   MAGNESIUM PO Take 400 mg by mouth.   Multiple Vitamins-Minerals (MULTIVITAMIN ADULTS PO) Take 1 tablet by mouth daily.   Omega-3  Fatty Acids (FISH OIL) 1000 MG CAPS Take 1 capsule by mouth daily.   pravastatin (PRAVACHOL) 10 MG tablet    umeclidinium-vilanterol (ANORO ELLIPTA ) 62.5-25 MCG/ACT AEPB INHALE 1 PUFF INTO THE LUNGS ONCE DAILY.   fluconazole  (DIFLUCAN ) 100 MG tablet Take 1 tablet (100 mg total) by mouth daily.   fluconazole  (DIFLUCAN ) 150 MG tablet Take 150 mg by mouth every 3 (three) days. (Patient not taking: Reported on 12/29/2023)   nystatin  (MYCOSTATIN /NYSTOP ) powder Apply 1 Application topically 3 (three) times daily.   No facility-administered encounter medications on file as of 12/29/2023.   Physical Exam: There were no vitals taken for this visit. Gen:      No acute distress HEENT:  EOMI, sclera anicteric Neck:     No masses; no thyromegaly Lungs:    Clear to auscultation bilaterally; normal respiratory effort CV:         Regular rate and rhythm; no murmurs Abd:      + bowel sounds; soft, non-tender; no palpable masses, no distension Ext:    No edema; adequate peripheral perfusion Neuro: alert and oriented x 3 Psych: normal mood and affect   Data Reviewed: Imaging: CT chest 01/16/2020-centrilobular emphysema, subcentimeter pulmonary nodule. Screening CT chest 02/15/2021-centrilobular emphysema, stable lung nodules Screening CT chest 02/18/2022-centrilobular and paraseptal emphysema, stable lung nodules. Screening CT chest 02/20/2023-stable emphysema, lung nodules I have reviewed the images personally  PFTs: 01/27/2020 FVC 2.98 [88%], FEV1 2.30 [88%], F/F 77, TLC 5.47 [102+], DLCO 20.17 [95%] Normal test  Labs: CBC 01/27/2020-WBC 10.7, eos 2.4%, absolute eosinophil count 364 IgE 01/27/2020-428  Assessment & Plan Asthma-COPD overlap syndrome Inhaler changed from Trelegy to Anoro to prevent recurrent pneumonia.  She is doing well with Anoro Does not want to take Singulair  due to fear of side effects Albuterol  inhaler was discarded due to expiration and has not been needed. Current management  includes antihistamines, Flonase nasal spray, and eye drops for dry eyes.  Lung nodules, ex-smoker On annual low-dose screening CT protocol.  Lung nodules have been stable.  Knee fracture Knee fracture in September, treated conservatively with a cast. No current issues. She has since downsized her living situation and no longer owns horses, which were a contributing factor to the injury.  General Health Maintenance CT scan is scheduled for August 2024, following previous stable results. - Continue annual CT scans as scheduled.   Plan/Recommendations: Continue Anoro Annual screening CT chest  Phyllis Breeze MD  Pulmonary and Critical Care 12/29/2023, 12:01 PM  CC: No ref. provider found

## 2023-12-29 NOTE — Patient Instructions (Addendum)
 VISIT SUMMARY:  You had a routine follow-up appointment to check on your asthma and overall health. Your asthma is well-controlled with your current medications, and you have not had any recent flare-ups. Your blood pressure was slightly elevated, which you believe is due to the stress of moving. You also discussed the dehydration and dry skin you are experiencing, which may be related to your pravastatin medication. Additionally, you reviewed your past knee fracture and your decision to move to a smaller house.  YOUR PLAN:  -ASTHMA: Your asthma is well-controlled with your current medications, including your nightly anoro inhaler, antihistamines, Flonase nasal spray, and eye drops for dry eyes. You have not had any recent asthma flare-ups, and you discarded your expired albuterol  inhaler.  -ELEVATED BLOOD PRESSURE: Your blood pressure was slightly elevated, likely due to the stress of moving and unpacking. No immediate treatment is needed at this time.  -DEHYDRATION DUE TO PRAVASTATIN: You are experiencing significant dehydration, dry skin, and brittle nails, which may be related to your pravastatin medication. You are advised to consult your primary care physician to discuss potential alternatives to pravastatin.  -KNEE FRACTURE: You had a knee fracture last September, which was treated with a cast. You have since moved to a smaller house and no longer own horses, which were a contributing factor to your injury.  -GENERAL HEALTH MAINTENANCE: Your routine CT scan is scheduled for August 2024. Continue with your annual CT scans as planned.  INSTRUCTIONS:  Please consult your primary care physician regarding the dehydration and dry skin you are experiencing, which may be related to your pravastatin medication. Continue with your scheduled CT scan in August 2024.

## 2023-12-31 ENCOUNTER — Ambulatory Visit (INDEPENDENT_AMBULATORY_CARE_PROVIDER_SITE_OTHER)

## 2023-12-31 ENCOUNTER — Ambulatory Visit: Admitting: Podiatry

## 2023-12-31 DIAGNOSIS — M21611 Bunion of right foot: Secondary | ICD-10-CM

## 2023-12-31 DIAGNOSIS — M2041 Other hammer toe(s) (acquired), right foot: Secondary | ICD-10-CM | POA: Diagnosis not present

## 2023-12-31 DIAGNOSIS — M21619 Bunion of unspecified foot: Secondary | ICD-10-CM

## 2023-12-31 NOTE — Progress Notes (Signed)
 Subjective:   Patient ID: [name], female   DOB: 68 y.o.   MRN: [id number]   HPI Chief Complaint  Patient presents with   Bunions    RM 14 Patient is here for right bunion on right foot. Pain presented since September 2024. Patient has been taking otc pain medication.    68 year old female presents the office today for concerns of pain on the right foot.  She has noticed a big toe lesion towards the second toe the second toe is curved up and above cause pain.  She has tried shoe modifications, offloading padding as well as toe spacers without significant she thinks this started after she broke her knee which did not require surgery.  She does take over-the-counter pain medication which does not.  She does not currently smoke.  No history of DVT.    Review of Systems  All other systems reviewed and are negative.   Past Medical History:  Diagnosis Date   Anxiety    Asthma    Diverticulosis    Hypercholesteremia    Pneumonia 12/2018   double pneumonia   PONV (postoperative nausea and vomiting)    Seasonal allergies    Shingles    Vaginal Pap smear, abnormal    Varicose veins     Past Surgical History:  Procedure Laterality Date   ABDOMINAL HYSTERECTOMY     partial   BACK SURGERY     cervical surgery   COLONOSCOPY N/A 02/04/2017   Procedure: COLONOSCOPY;  Surgeon: Ruby Corporal, MD;  Location: [facility];  Service: Endoscopy;  Laterality: N/A;  930   ENDOSCOPIC CONCHA BULLOSA RESECTION Bilateral 03/21/2019   Procedure: ENDOSCOPIC CONCHA BULLOSA RESECTION;  Surgeon: Reynold Caves, MD;  Location: [facility];  Service: ENT;  Laterality: Bilateral;   NASAL SEPTOPLASTY W/ TURBINOPLASTY Bilateral 03/21/2019   Procedure: NASAL SEPTOPLASTY WITH BILATERAL TURBINATE REDUCTION;  Surgeon: Reynold Caves, MD;  Location: [facility];  Service: ENT;  Laterality: Bilateral;   TONSILLECTOMY     WRIST SURGERY     age 35yrs     Current Outpatient  Medications:    aspirin  81 MG EC tablet, Take 81 mg by mouth every other day., Disp: , Rfl:    B Complex-C (SUPER B COMPLEX PO), Take 1 tablet by mouth daily., Disp: , Rfl:    Biotin 1000 MCG CHEW, Chew by mouth., Disp: , Rfl:    cetirizine  (ZYRTEC  ALLERGY) 10 MG tablet, Take 1 tablet (10 mg total) by mouth daily., Disp: 90 tablet, Rfl: 3   Cholecalciferol (VITAMIN D3) 25 MCG (1000 UT) CAPS, Take 1 capsule by mouth daily., Disp: , Rfl:    Cranberry 1000 MG CAPS, Take 1 capsule by mouth daily., Disp: , Rfl:    estradiol  (CLIMARA ) 0.0375 mg/24hr patch, Place 1 patch (0.0375 mg total) onto the skin once a week., Disp: 4 patch, Rfl: 2   fluconazole  (DIFLUCAN ) 100 MG tablet, Take 1 tablet (100 mg total) by mouth daily., Disp: 10 tablet, Rfl: 0   fluconazole  (DIFLUCAN ) 150 MG tablet, Take 150 mg by mouth every 3 (three) days., Disp: , Rfl:    Garlic  1000 MG CAPS, Take 1 capsule by mouth daily., Disp: , Rfl:    MAGNESIUM PO, Take 400 mg by mouth., Disp: , Rfl:    Multiple Vitamins-Minerals (MULTIVITAMIN ADULTS PO), Take 1 tablet by mouth daily., Disp: , Rfl:    nystatin  (MYCOSTATIN /NYSTOP ) powder, Apply 1 Application topically 3 (three) times daily., Disp:  15 g, Rfl: 0   Omega-3 Fatty Acids (FISH OIL) 1000 MG CAPS, Take 1 capsule by mouth daily., Disp: , Rfl:    pravastatin (PRAVACHOL) 10 MG tablet, , Disp: , Rfl:    umeclidinium-vilanterol (ANORO ELLIPTA ) 62.5-25 MCG/ACT AEPB, INHALE 1 PUFF INTO THE LUNGS ONCE DAILY., Disp: 60 each, Rfl: 11  Allergies  Allergen Reactions   Bee Venom Anaphylaxis   Codeine Nausea And Vomiting   Other Nausea And Vomiting    General anesthesia    Propofol  Nausea And Vomiting         Objective:  Physical Exam  General: AAO x3, NAD  Dermatological: Skin is warm, dry and supple bilateral.  Mild erythema and on the bunion as well as the dorsal second toe from rubbing inside shoes but there is no skin breakdown or warmth. No open lesions.   Vascular: Dorsalis  Pedis artery and Posterior Tibial artery pedal pulses are 2/4 bilateral with immedate capillary fill time.  There is no pain with calf compression, swelling, warmth, erythema.   Neruologic: Grossly intact via light touch bilateral.   Musculoskeletal: Bunions present which is putting pressure on the second toe and the toes are rubbing.  Hammertoe deformity noted of the second digit with tenderness palpation of the bunion, second toe. No other areas of pinpoint tenderness.     Assessment:   68 year old female with bunion, hammertoe deformity     Plan:  -Treatment options discussed including all alternatives, risks, and complications -Etiology of symptoms were discussed -X-rays were obtained and reviewed with the patient.  Multiple views right foot were obtained.  No evidence of acute fracture.  Moderate bunion is present with 2nd digit hammertoe deformities noted. -We discussed with conservative as well as surgical treatment options.  She is attempted numerous conservative treatment without any significant improvement she wished to proceed with surgical intervention. - Will proceed with right foot Sheryle Donning bunionectomy with hammertoe repair the second digit (likely screw fixation if able).  -The incision placement as well as the postoperative course was discussed with the patient. I discussed risks of the surgery which include, but not limited to, infection, bleeding, pain, swelling, need for further surgery, delayed or nonhealing, painful or ugly scar, numbness or sensation changes, over/under correction, recurrence, transfer lesions, further deformity, hardware failure, DVT/PE, loss of toe/foot. Patient understands these risks and wishes to proceed with surgery. The surgical consent was reviewed with the patient all 3 pages were signed. No promises or guarantees were given to the outcome of the procedure. All questions were answered to the best of my ability. Before the surgery the patient was  encouraged to call the office if there is any further questions. The surgery will be performed at the [facility] on an outpatient basis.  No follow-ups on file.  Charity Conch DPM

## 2023-12-31 NOTE — Patient Instructions (Signed)

## 2024-01-12 ENCOUNTER — Other Ambulatory Visit (HOSPITAL_COMMUNITY): Payer: Self-pay | Admitting: Family Medicine

## 2024-01-12 ENCOUNTER — Encounter (HOSPITAL_COMMUNITY): Payer: Self-pay | Admitting: Family Medicine

## 2024-01-12 DIAGNOSIS — M8580 Other specified disorders of bone density and structure, unspecified site: Secondary | ICD-10-CM | POA: Diagnosis not present

## 2024-01-12 DIAGNOSIS — Z79890 Hormone replacement therapy (postmenopausal): Secondary | ICD-10-CM | POA: Diagnosis not present

## 2024-01-12 DIAGNOSIS — Z0000 Encounter for general adult medical examination without abnormal findings: Secondary | ICD-10-CM | POA: Diagnosis not present

## 2024-01-12 DIAGNOSIS — E66812 Obesity, class 2: Secondary | ICD-10-CM | POA: Diagnosis not present

## 2024-01-12 DIAGNOSIS — N951 Menopausal and female climacteric states: Secondary | ICD-10-CM | POA: Diagnosis not present

## 2024-01-12 DIAGNOSIS — L853 Xerosis cutis: Secondary | ICD-10-CM | POA: Diagnosis not present

## 2024-01-12 DIAGNOSIS — Z1231 Encounter for screening mammogram for malignant neoplasm of breast: Secondary | ICD-10-CM

## 2024-01-12 DIAGNOSIS — J4489 Other specified chronic obstructive pulmonary disease: Secondary | ICD-10-CM | POA: Diagnosis not present

## 2024-01-12 DIAGNOSIS — Z5181 Encounter for therapeutic drug level monitoring: Secondary | ICD-10-CM | POA: Diagnosis not present

## 2024-01-12 DIAGNOSIS — R7303 Prediabetes: Secondary | ICD-10-CM | POA: Diagnosis not present

## 2024-01-12 DIAGNOSIS — Z6835 Body mass index (BMI) 35.0-35.9, adult: Secondary | ICD-10-CM | POA: Diagnosis not present

## 2024-01-12 DIAGNOSIS — E782 Mixed hyperlipidemia: Secondary | ICD-10-CM | POA: Diagnosis not present

## 2024-01-14 ENCOUNTER — Telehealth: Payer: Self-pay | Admitting: Podiatry

## 2024-01-14 NOTE — Telephone Encounter (Signed)
 PER JESSICA M AT [facility] NO AUTH IS NEEDED FOR CPT [id number] AND CPT [phone].  REFERENCE NUMBER [id number]

## 2024-01-20 ENCOUNTER — Other Ambulatory Visit: Payer: Self-pay | Admitting: Podiatry

## 2024-01-20 DIAGNOSIS — M2011 Hallux valgus (acquired), right foot: Secondary | ICD-10-CM | POA: Diagnosis not present

## 2024-01-20 DIAGNOSIS — M2041 Other hammer toe(s) (acquired), right foot: Secondary | ICD-10-CM | POA: Diagnosis not present

## 2024-01-20 DIAGNOSIS — G8918 Other acute postprocedural pain: Secondary | ICD-10-CM | POA: Diagnosis not present

## 2024-01-20 DIAGNOSIS — M21611 Bunion of right foot: Secondary | ICD-10-CM | POA: Diagnosis not present

## 2024-01-20 HISTORY — PX: FOOT SURGERY: SHX648

## 2024-01-20 MED ORDER — CEPHALEXIN 500 MG PO CAPS: 500.0000 mg | ORAL_CAPSULE | Freq: Three times a day (TID) | ORAL | 0 refills | Status: AC

## 2024-01-20 MED ORDER — PROMETHAZINE HCL 25 MG PO TABS: 25.0000 mg | ORAL_TABLET | Freq: Three times a day (TID) | ORAL | 0 refills | Status: DC | PRN

## 2024-01-20 MED ORDER — OXYCODONE-ACETAMINOPHEN 5-325 MG PO TABS
1.0000 | ORAL_TABLET | Freq: Four times a day (QID) | ORAL | 0 refills | Status: DC | PRN
Start: 2024-01-20 — End: 2024-03-03

## 2024-01-20 NOTE — Progress Notes (Signed)
 Postop medications sent. She has done well with these medications in the past she reports.

## 2024-01-25 ENCOUNTER — Encounter: Payer: Self-pay | Admitting: Podiatry

## 2024-01-25 ENCOUNTER — Ambulatory Visit (INDEPENDENT_AMBULATORY_CARE_PROVIDER_SITE_OTHER)

## 2024-01-25 ENCOUNTER — Ambulatory Visit (INDEPENDENT_AMBULATORY_CARE_PROVIDER_SITE_OTHER): Admitting: Podiatry

## 2024-01-25 DIAGNOSIS — Z9889 Other specified postprocedural states: Secondary | ICD-10-CM

## 2024-01-25 NOTE — Progress Notes (Signed)
 Subjective:  Patient ID: [name], female    DOB: 12/28/1955,  MRN: [id number]  Chief Complaint  Patient presents with   Routine Post Op     Right bunion repair. Right second hammertoe correction. 6 pain. Non diabetic A1C 6.0. Wearing pneumatic cast.       DOS: 01/20/24 Procedure: Right bunion repair. Right second hammertoe correction  68 y.o. female returns for POV#1. Relates doing well and managing pain  Review of Systems: Negative except as noted in the HPI. Denies N/V/F/Ch.  Past Medical History:  Diagnosis Date   Anxiety    Asthma    Diverticulosis    Hypercholesteremia    Pneumonia 12/2018   double pneumonia   PONV (postoperative nausea and vomiting)    Seasonal allergies    Shingles    Vaginal Pap smear, abnormal    Varicose veins     Current Outpatient Medications:    aspirin  81 MG EC tablet, Take 81 mg by mouth every other day., Disp: , Rfl:    B Complex-C (SUPER B COMPLEX PO), Take 1 tablet by mouth daily., Disp: , Rfl:    Biotin 1000 MCG CHEW, Chew by mouth., Disp: , Rfl:    cephALEXin  (KEFLEX ) 500 MG capsule, Take 1 capsule (500 mg total) by mouth 3 (three) times daily., Disp: 21 capsule, Rfl: 0   cetirizine  (ZYRTEC  ALLERGY) 10 MG tablet, Take 1 tablet (10 mg total) by mouth daily., Disp: 90 tablet, Rfl: 3   Cholecalciferol (VITAMIN D3) 25 MCG (1000 UT) CAPS, Take 1 capsule by mouth daily., Disp: , Rfl:    Cranberry 1000 MG CAPS, Take 1 capsule by mouth daily., Disp: , Rfl:    estradiol  (CLIMARA ) 0.0375 mg/24hr patch, Place 1 patch (0.0375 mg total) onto the skin once a week., Disp: 4 patch, Rfl: 2   fluconazole  (DIFLUCAN ) 150 MG tablet, Take 150 mg by mouth every 3 (three) days., Disp: , Rfl:    Garlic  1000 MG CAPS, Take 1 capsule by mouth daily., Disp: , Rfl:    MAGNESIUM PO, Take 400 mg by mouth., Disp: , Rfl:    Multiple Vitamins-Minerals (MULTIVITAMIN ADULTS PO), Take 1 tablet by mouth daily., Disp: , Rfl:    Omega-3 Fatty Acids (FISH OIL) 1000  MG CAPS, Take 1 capsule by mouth daily., Disp: , Rfl:    oxyCODONE -acetaminophen  (PERCOCET/ROXICET) 5-325 MG tablet, Take 1-2 tablets by mouth every 6 (six) hours as needed for severe pain (pain score 7-10)., Disp: 25 tablet, Rfl: 0   pravastatin (PRAVACHOL) 10 MG tablet, , Disp: , Rfl:    promethazine  (PHENERGAN ) 25 MG tablet, Take 1 tablet (25 mg total) by mouth every 8 (eight) hours as needed for nausea or vomiting., Disp: 20 tablet, Rfl: 0   umeclidinium-vilanterol (ANORO ELLIPTA ) 62.5-25 MCG/ACT AEPB, INHALE 1 PUFF INTO THE LUNGS ONCE DAILY., Disp: 60 each, Rfl: 11   fluconazole  (DIFLUCAN ) 100 MG tablet, Take 1 tablet (100 mg total) by mouth daily., Disp: 10 tablet, Rfl: 0   nystatin  (MYCOSTATIN /NYSTOP ) powder, Apply 1 Application topically 3 (three) times daily., Disp: 15 g, Rfl: 0  Social History   Tobacco Use  Smoking Status Former   Current packs/day: 0.00   Average packs/day: 0.5 packs/day for 45.0 years (22.5 ttl pk-yrs)   Types: Cigarettes   Start date: 07/20/1976   Quit date: 07/20/2021   Years since quitting: 2.5  Smokeless Tobacco Never    Allergies  Allergen Reactions   Bee Venom Anaphylaxis   Codeine Nausea And  Vomiting   Other Nausea And Vomiting    General anesthesia    Propofol  Nausea And Vomiting   Objective:  There were no vitals filed for this visit. There is no height or weight on file to calculate BMI. Constitutional Well developed. Well nourished.  Vascular Foot warm and well perfused. Capillary refill normal to all digits.   Neurologic Normal speech. Oriented to person, place, and time. Epicritic sensation to light touch grossly present bilaterally.  Dermatologic Skin healing well without signs of infection. Skin edges well coapted without signs of infection.  Orthopedic: Tenderness to palpation noted about the surgical site.   Radiographs: Hardware intact and toes well aligned Assessment:   1. Post-operative state    Plan:  Patient was  evaluated and treated and all questions answered.  S/p foot surgery right -Progressing as expected post-operatively. -WB Status: NWB in CAM boot  -Sutures: intact. -Medications: n/a -Foot redressed.  Return in 1 week with Dr. Gershon.   No follow-ups on file.

## 2024-02-04 ENCOUNTER — Ambulatory Visit (INDEPENDENT_AMBULATORY_CARE_PROVIDER_SITE_OTHER): Admitting: Podiatry

## 2024-02-04 VITALS — HR 79 | Temp 97.8°F | Ht 66.0 in | Wt 219.8 lb

## 2024-02-04 DIAGNOSIS — Z9889 Other specified postprocedural states: Secondary | ICD-10-CM | POA: Diagnosis not present

## 2024-02-04 DIAGNOSIS — M21611 Bunion of right foot: Secondary | ICD-10-CM | POA: Diagnosis not present

## 2024-02-04 DIAGNOSIS — M21619 Bunion of unspecified foot: Secondary | ICD-10-CM

## 2024-02-04 NOTE — Progress Notes (Signed)
  Subjective:  Patient ID: [name], female    DOB: 1955/12/21,  MRN: [id number] Chief Complaint  Patient presents with   Post-op Follow-up    Rm Patient is here for post-op follow-up right 2nd hammertoe correction and right bunion repair.      DOS: 01/20/24 Procedure: Right bunion repair. Right second hammertoe correction  68 y.o. female returns for POV#2.  States that she has been doing well.  She is not reporting significant pain except for on days that she is try to do too much.  She has been in some activities around the house is have increased pain after being on her feet.  She does not report any fevers or chills.  Review of Systems: Negative except as noted.     Objective:  There were no vitals filed for this visit. There is no height or weight on file to calculate BMI. Constitutional Well developed. Well nourished.  Vascular Foot warm and well perfused. Capillary refill normal to all digits.   Neurologic Normal speech. Oriented to person, place, and time. Epicritic sensation to light touch grossly present bilaterally.  Dermatologic Incisions are healing well.  There is no evidence of dehiscence.  There is no surrounding erythema, ascending cellulitis.  No drainage or pus.  No signs of infection.  Orthopedic: There is no significant tenderness to palpation noted about the surgical site.   Assessment:   Bunion, Hammertoe right foot  Plan:  Patient was evaluated and treated and all questions answered.  S/p foot surgery right -Sutures removed today without complications.  Incisions healing well.  Antibiotic was applied followed by dressing.  Discussed that she can wash the foot with soap and water , dry thoroughly apply a similar bandage. -Weight-bear as tolerated although limited in cam boot.  Also dispensed a surgical shoe for offloading for short distances. -Continue ice, elevate -Pain medication as needed  Return in about 2 weeks (around 02/18/2024) for  post-op, x-ray.  [name] DPM

## 2024-02-08 ENCOUNTER — Ambulatory Visit: Admitting: Obstetrics & Gynecology

## 2024-02-08 ENCOUNTER — Encounter: Payer: Self-pay | Admitting: Obstetrics & Gynecology

## 2024-02-08 VITALS — BP 142/80 | HR 66 | Ht 66.0 in | Wt 222.5 lb

## 2024-02-08 DIAGNOSIS — N951 Menopausal and female climacteric states: Secondary | ICD-10-CM | POA: Diagnosis not present

## 2024-02-18 ENCOUNTER — Ambulatory Visit (INDEPENDENT_AMBULATORY_CARE_PROVIDER_SITE_OTHER)

## 2024-02-18 ENCOUNTER — Ambulatory Visit (INDEPENDENT_AMBULATORY_CARE_PROVIDER_SITE_OTHER): Admitting: Podiatry

## 2024-02-18 ENCOUNTER — Encounter: Payer: Self-pay | Admitting: Podiatry

## 2024-02-18 DIAGNOSIS — M2041 Other hammer toe(s) (acquired), right foot: Secondary | ICD-10-CM | POA: Diagnosis not present

## 2024-02-18 DIAGNOSIS — M21619 Bunion of unspecified foot: Secondary | ICD-10-CM

## 2024-02-18 NOTE — Progress Notes (Signed)
  Subjective:  Patient ID: [name], female    DOB: [birth year],  MRN: [id number]   DOS: 01/20/24 Procedure: Right bunion repair. Right second hammertoe correction  68 y.o. female returns for POV#3.  States that she is doing well but she is try to get rid of the surgical shoe.  She does not report any fevers or chills.  No pain.  No new concerns.   Review of Systems: Negative except as noted.     Objective:  There were no vitals filed for this visit.  Constitutional Well developed. Well nourished.  Vascular Foot warm and well perfused. Capillary refill normal to all digits.   Neurologic Normal speech. Oriented to person, place, and time. Epicritic sensation to light touch grossly present bilaterally.  Dermatologic Incisions are healing well.  Scabbing present along the incision but otherwise incisions healing well there is no evidence of dehiscence.  No surrounding erythema, ascending cellulitis.  No drainage or pus.  No signs of infection.  Orthopedic: On exam today there is no area of tenderness.  K wire intact second toe.  Toes are rectus.   Assessment:   Bunion, Hammertoe right foot  Plan:  Patient was evaluated and treated and all questions answered.  S/p foot surgery right - X-rays obtained reviewed.  Multiple views obtained.  Hardware intact but uncomplicated factors.  Increased consolidation noted. -Clean the foot.  Discussed that she can wash the foot with soap and water , dry thoroughly and apply a similar range.  Dressing reapplied today. -Weight-bear as tolerated although limited in surgical shoe. I did glue the insert in the shoe today so it would not come out for her.  -Continue ice, elevate -Pain medication as needed  Return in about 2 weeks (around 03/03/2024) for x-ray, pin removal .  [name] DPM

## 2024-02-23 ENCOUNTER — Ambulatory Visit (HOSPITAL_COMMUNITY)
Admission: RE | Admit: 2024-02-23 | Discharge: 2024-02-23 | Disposition: A | Discharge status: Home / Self Care | Source: Ambulatory Visit | Attending: Family Medicine | Admitting: Family Medicine

## 2024-02-23 ENCOUNTER — Telehealth: Payer: Self-pay | Admitting: Podiatry

## 2024-02-23 DIAGNOSIS — Z87891 Personal history of nicotine dependence: Secondary | ICD-10-CM | POA: Diagnosis not present

## 2024-02-23 DIAGNOSIS — Z122 Encounter for screening for malignant neoplasm of respiratory organs: Secondary | ICD-10-CM | POA: Diagnosis not present

## 2024-02-23 NOTE — Telephone Encounter (Signed)
 Spoke with patient states no signs of infection states foot is doing good no fever or chills believes she got work up with family issue and that is when she had a break out almost like hives. Advised patient to contact PCP if in need of further care she also stated not on any type of pain relievers at this time.

## 2024-02-23 NOTE — Progress Notes (Signed)
 Appointment to discuss hormones and weight loss   Chief Complaint  Patient presents with   wants hormones and testosterone  checked    Hasn't been able to lose weight    Blood pressure (!) 142/80, pulse 66, height 5' 6 (1.676 m), weight 222 lb 8 oz (100.9 kg).  I infomred patient her hormones are not an issue for her weight loss as she is way past menopause However I discussed in great detail with her my nutritional recommendations including print resource  ^ walking as well when she is able post injury healing  MEDS ordered this encounter: No orders of the defined types were placed in this encounter.   Orders for this encounter: No orders of the defined types were placed in this encounter.   Impression + Management Plan   ICD-10-CM   1. Menopausal vasomotor syndrome  N95.1       Follow Up: No follow-ups on file.     All questions were answered.  Past Medical History:  Diagnosis Date   Anxiety    Asthma    Diabetes mellitus without complication (HCC)    borderline   Diverticulosis    Hypercholesteremia    Pneumonia 12/2018   double pneumonia   PONV (postoperative nausea and vomiting)    Seasonal allergies    Shingles    Vaginal Pap smear, abnormal    Varicose veins     Past Surgical History:  Procedure Laterality Date   ABDOMINAL HYSTERECTOMY     partial   BACK SURGERY     cervical surgery   COLONOSCOPY N/A 02/04/2017   Procedure: COLONOSCOPY;  Surgeon: Golda Claudis PENNER, MD;  Location: [facility];  Service: Endoscopy;  Laterality: N/A;  930   ENDOSCOPIC CONCHA BULLOSA RESECTION Bilateral 03/21/2019   Procedure: ENDOSCOPIC CONCHA BULLOSA RESECTION;  Surgeon: Karis Clunes, MD;  Location: [facility];  Service: ENT;  Laterality: Bilateral;   FOOT SURGERY  01/20/2024   NASAL SEPTOPLASTY W/ TURBINOPLASTY Bilateral 03/21/2019   Procedure: NASAL SEPTOPLASTY WITH BILATERAL TURBINATE REDUCTION;  Surgeon: Karis Clunes, MD;  Location: [facility];  Service: ENT;  Laterality: Bilateral;   TONSILLECTOMY     WRIST SURGERY     age 1yrs    OB History     Gravida  3   Para  3   Term  3   Preterm      AB      Living  3      SAB      IAB      Ectopic      Multiple      Live Births  3           Allergies  Allergen Reactions   Bee Venom Anaphylaxis   Codeine Nausea And Vomiting   Nirmatrelvir-Ritonavir Diarrhea and Nausea Only   Other Nausea And Vomiting    General anesthesia    Propofol  Nausea And Vomiting    Social History   Socioeconomic History   Marital status: Widowed    Spouse name: Not on file   Number of children: 3   Years of education: Not on file   Highest education level: Not on file  Occupational History   Not on file  Tobacco Use   Smoking status: Former    Current packs/day: 0.00    Average packs/day: 0.5 packs/day for 45.0 years (22.5 ttl pk-yrs)    Types: Cigarettes    Start date: 07/20/1976    Quit  date: 07/20/2021    Years since quitting: 2.5   Smokeless tobacco: Never  Vaping Use   Vaping status: Former  Substance and Sexual Activity   Alcohol use: Yes    Alcohol/week: 0.0 standard drinks of alcohol    Comment: occ   Drug use: No   Sexual activity: Not Currently    Birth control/protection: Post-menopausal, Surgical    Comment: hyst  Other Topics Concern   Not on file  Social History Narrative   Not on file   Social Drivers of Health   Financial Resource Strain: Low Risk  (06/27/2020)   Overall Financial Resource Strain (CARDIA)    Difficulty of Paying Living Expenses: Not hard at all  Food Insecurity: Low Risk  (10/28/2023)   Received from [facility]   Hunger Vital Sign    Within the past 12 months, you worried that your food would run out before you got money to buy more: Never true    Within the past 12 months, the food you bought just didn't last and you didn't have money to get more. : Never true  Transportation Needs: No Transportation Needs  (10/28/2023)   Received from [facility]    In the past 12 months, has lack of reliable transportation kept you from medical appointments, meetings, work or from getting things needed for daily living? : No  Physical Activity: Inactive (06/27/2020)   Exercise Vital Sign    Days of Exercise per Week: 0 days    Minutes of Exercise per Session: 0 min  Stress: No Stress Concern Present (06/27/2020)   [employer] of Occupational Health - Occupational Stress Questionnaire    Feeling of Stress : Not at all  Social Connections: Moderately Integrated (06/27/2020)   Social Connection and Isolation Panel    Frequency of Communication with Friends and Family: More than three times a week    Frequency of Social Gatherings with Friends and Family: Three times a week    Attends Religious Services: 1 to 4 times per year    Active Member of Clubs or Organizations: No    Attends Engineer, structural: Never    Marital Status: Married    Family History  Problem Relation Age of Onset   Cancer Paternal Grandmother    Bone cancer Paternal Grandmother    Diabetes Maternal Grandmother    Dementia Maternal Grandmother    Cancer Maternal Grandfather    Leukemia Maternal Grandfather    Cancer Father    Gallbladder disease Father    Breast cancer Mother    Cancer Mother        breast   Heart attack Mother    Acute myelogenous leukemia Mother    Diabetes Brother    Congestive Heart Failure Brother    Cancer Brother    Skin cancer Brother    Diabetes Brother    Cancer Maternal Aunt    Thyroid  cancer Maternal Aunt    Brain cancer Maternal Aunt    Cancer Maternal Aunt    Breast cancer Maternal Aunt    Lung cancer Maternal Aunt    Cancer Paternal Aunt    Bone cancer Paternal Aunt    Breast cancer Cousin    Colon cancer Neg Hx

## 2024-02-23 NOTE — Telephone Encounter (Signed)
 Patient wants to inform you that as of yesterday she has nausea and diarrhea.

## 2024-03-03 ENCOUNTER — Encounter: Payer: Self-pay | Admitting: Podiatry

## 2024-03-03 ENCOUNTER — Ambulatory Visit (INDEPENDENT_AMBULATORY_CARE_PROVIDER_SITE_OTHER)

## 2024-03-03 ENCOUNTER — Ambulatory Visit (INDEPENDENT_AMBULATORY_CARE_PROVIDER_SITE_OTHER): Admitting: Podiatry

## 2024-03-03 VITALS — Ht 66.0 in | Wt 222.5 lb

## 2024-03-03 DIAGNOSIS — M21619 Bunion of unspecified foot: Secondary | ICD-10-CM

## 2024-03-03 DIAGNOSIS — M2041 Other hammer toe(s) (acquired), right foot: Secondary | ICD-10-CM

## 2024-03-03 MED ORDER — CICLOPIROX 8 % EX SOLN: Freq: Every day | CUTANEOUS | 2 refills | Status: AC

## 2024-03-03 NOTE — Progress Notes (Signed)
  Subjective:  Patient ID: [name], female    DOB: Oct 17, 1955,  MRN: [id number]   DOS: 01/20/24 Procedure: Right bunion repair. Right second hammertoe correction  68 y.o. female returns for POV#4.  States that she is doing well but she is try to get rid of the surgical shoe.  She presents today to have the K wire removed.  She states that she has not worn a surgical shoe for the past week.  She does try to keep it elevated.  Treatment.  Review of Systems: Negative except as noted.     Objective:  There were no vitals filed for this visit.  Constitutional Well developed. Well nourished.  Vascular Foot warm and well perfused. Capillary refill normal to all digits.   Neurologic Normal speech. Oriented to person, place, and time. Epicritic sensation to light touch grossly present bilaterally.  Dermatologic Incisions are healing well.  Incisions are healed.  There is still some slight edema present but there is no area nails are hypertrophic, dystrophic with yellow, brown discoloration.  Orthopedic: On exam today there is no area of tenderness.  K wire intact second toe.  Toes are rectus.  No significant pain on exam.   Assessment:   Bunion, Hammertoe right foot; onychomycosis  Plan:  Patient was evaluated and treated and all questions answered.  S/p foot surgery right - X-rays obtained reviewed.  Multiple views obtained.  Hardware intact but uncomplicated factors.  Increased consolidation noted.  Without any complicating factors noted. -K wire removed in total without any complications.  Antibiotic ointment is applied by dressing.  Discussed that tomorrow she can start to wash the foot with soap and water, dry.  Continue ice, elevate as well as compression to help with any residual edema.  She can gradually start to transition to regular shoe as tolerated pending discomfort, swelling. -Prescribed Penlac for nail fungus  Return in about 4 weeks (around 03/31/2024) for  post-op, x-ray.  [name] DPM

## 2024-03-08 ENCOUNTER — Other Ambulatory Visit: Payer: Self-pay

## 2024-03-08 DIAGNOSIS — Z87891 Personal history of nicotine dependence: Secondary | ICD-10-CM

## 2024-03-08 DIAGNOSIS — Z122 Encounter for screening for malignant neoplasm of respiratory organs: Secondary | ICD-10-CM

## 2024-03-08 DIAGNOSIS — F17210 Nicotine dependence, cigarettes, uncomplicated: Secondary | ICD-10-CM

## 2024-03-11 ENCOUNTER — Ambulatory Visit (HOSPITAL_COMMUNITY)
Admission: RE | Admit: 2024-03-11 | Discharge: 2024-03-11 | Disposition: A | Discharge status: Home / Self Care | Source: Ambulatory Visit | Attending: Family Medicine | Admitting: Family Medicine

## 2024-03-11 ENCOUNTER — Encounter (HOSPITAL_COMMUNITY): Payer: Self-pay

## 2024-03-11 DIAGNOSIS — Z1231 Encounter for screening mammogram for malignant neoplasm of breast: Secondary | ICD-10-CM | POA: Diagnosis not present

## 2024-04-12 ENCOUNTER — Ambulatory Visit

## 2024-04-12 ENCOUNTER — Ambulatory Visit (INDEPENDENT_AMBULATORY_CARE_PROVIDER_SITE_OTHER): Admitting: Podiatry

## 2024-04-12 VITALS — Ht 66.0 in | Wt 222.5 lb

## 2024-04-12 DIAGNOSIS — M21619 Bunion of unspecified foot: Secondary | ICD-10-CM

## 2024-04-12 DIAGNOSIS — M21611 Bunion of right foot: Secondary | ICD-10-CM | POA: Diagnosis not present

## 2024-04-12 DIAGNOSIS — M2041 Other hammer toe(s) (acquired), right foot: Secondary | ICD-10-CM

## 2024-04-12 NOTE — Progress Notes (Signed)
  Subjective:  Patient ID: [name], female    DOB: 01/09/1956,  MRN: [id number]  Chief Complaint  Patient presents with   Hammer Toe    Rm 12 Patient is here to f/u on hammertoe of right foot. Pt states right hallux is leaning towards right index toe.    DOS: 01/20/24 Procedure: Right bunion repair. Right second hammertoe correction  68 y.o. female returns for follow-up evaluation status post right foot surgery.  She states that she has been doing well and not having any significant pain.  She is back to wearing regular shoes and doing normal activities.  She states the second toe feels like rubber at times, but no pain. She notes that the big toe is leaning towards the second toe similar to the contralateral extremity.  She did try to purchase an over-the-counter spacer but it was hurting as it was too bulky.  Treatment.  Review of Systems: Negative except as noted.     Objective:  There were no vitals filed for this visit.  Constitutional Well developed. Well nourished.  Vascular Foot warm and well perfused. Capillary refill normal to all digits.   Neurologic Normal speech. Oriented to person, place, and time. Epicritic sensation to light touch grossly present bilaterally.  Dermatologic Incisions are healing well.  Incisions are healed.  There is no sign of edema there is no erythema the bunion.  Mild edema to the second toe but no erythema or warmth.  No signs of infection.  Orthopedic: There is no significant pain.  There is some mild residual hallux valgus noted.  There is no tenderness to palpation.  No pain with MPJ range of motion.  No pain to the second toe.   Assessment:   Bunion, Hammertoe right foot; onychomycosis  Plan:  Patient was evaluated and treated and all questions answered.  S/p foot surgery right - X-rays obtained reviewed.  Multiple views obtained.  Hardware intact but uncomplicated factors.  Increased consolidation noted.  No immediate  complications noted. -I dispensed a foam toe spacer to put in between the 1st and 2nd toes.  Continue with supportive shoe gear and wider toebox to avoid pressure.  She is back to doing regular activities without any pain.  Discussed with her can take several months for the swelling to continue to improve although it is already improved substantially.  Asthma discharge her for the postoperative course and she agrees with this plan but she is well aware that she can contact with any changes or concerns.  Return if symptoms worsen or fail to improve.  [name] DPM

## 2024-05-03 ENCOUNTER — Encounter (INDEPENDENT_AMBULATORY_CARE_PROVIDER_SITE_OTHER): Payer: Self-pay | Admitting: *Deleted

## 2024-05-03 DIAGNOSIS — Z23 Encounter for immunization: Secondary | ICD-10-CM | POA: Diagnosis not present

## 2024-05-11 ENCOUNTER — Other Ambulatory Visit: Payer: Self-pay | Admitting: Pulmonary Disease

## 2024-06-01 DIAGNOSIS — H43393 Other vitreous opacities, bilateral: Secondary | ICD-10-CM | POA: Diagnosis not present

## 2024-09-12 ENCOUNTER — Inpatient Hospital Stay: Payer: PPO

## 2024-09-16 ENCOUNTER — Ambulatory Visit: Payer: PPO | Admitting: Oncology

## 2024-09-16 ENCOUNTER — Inpatient Hospital Stay: Admitting: Oncology
